# Patient Record
Sex: Female | Born: 1950
Health system: Southern US, Community
[De-identification: ages and names within clinical notes are randomized; demographics above are authoritative.]

## PROBLEM LIST (undated history)

## (undated) DIAGNOSIS — H269 Unspecified cataract: Secondary | ICD-10-CM

## (undated) DIAGNOSIS — R112 Nausea with vomiting, unspecified: Secondary | ICD-10-CM

## (undated) DIAGNOSIS — I10 Essential (primary) hypertension: Secondary | ICD-10-CM

## (undated) DIAGNOSIS — T7840XA Allergy, unspecified, initial encounter: Secondary | ICD-10-CM

## (undated) DIAGNOSIS — M810 Age-related osteoporosis without current pathological fracture: Secondary | ICD-10-CM

## (undated) DIAGNOSIS — T8859XA Other complications of anesthesia, initial encounter: Secondary | ICD-10-CM

## (undated) DIAGNOSIS — T4145XA Adverse effect of unspecified anesthetic, initial encounter: Secondary | ICD-10-CM

## (undated) DIAGNOSIS — K219 Gastro-esophageal reflux disease without esophagitis: Secondary | ICD-10-CM

## (undated) DIAGNOSIS — Z9889 Other specified postprocedural states: Secondary | ICD-10-CM

## (undated) DIAGNOSIS — E785 Hyperlipidemia, unspecified: Secondary | ICD-10-CM

## (undated) DIAGNOSIS — N3010 Interstitial cystitis (chronic) without hematuria: Secondary | ICD-10-CM

## (undated) HISTORY — PX: CHOLECYSTECTOMY: SHX55

## (undated) HISTORY — PX: BLADDER SUSPENSION: SHX72

## (undated) HISTORY — DX: Unspecified cataract: H26.9

## (undated) HISTORY — DX: Hyperlipidemia, unspecified: E78.5

## (undated) HISTORY — DX: Allergy, unspecified, initial encounter: T78.40XA

## (undated) HISTORY — DX: Essential (primary) hypertension: I10

## (undated) HISTORY — DX: Age-related osteoporosis without current pathological fracture: M81.0

## (undated) HISTORY — PX: EYE SURGERY: SHX253

---

## 1999-04-22 DIAGNOSIS — I10 Essential (primary) hypertension: Secondary | ICD-10-CM | POA: Insufficient documentation

## 2000-10-14 ENCOUNTER — Other Ambulatory Visit: Admission: RE | Admit: 2000-10-14 | Discharge: 2000-10-14 | Payer: Self-pay | Admitting: Family Medicine

## 2004-01-09 ENCOUNTER — Ambulatory Visit: Payer: Self-pay | Admitting: Family Medicine

## 2004-11-24 ENCOUNTER — Ambulatory Visit: Payer: Self-pay | Admitting: Family Medicine

## 2006-01-19 HISTORY — PX: INSERTION OF MESH: SHX5868

## 2006-02-22 DIAGNOSIS — N3010 Interstitial cystitis (chronic) without hematuria: Secondary | ICD-10-CM | POA: Insufficient documentation

## 2006-02-22 DIAGNOSIS — K461 Unspecified abdominal hernia with gangrene: Secondary | ICD-10-CM | POA: Insufficient documentation

## 2006-03-09 ENCOUNTER — Ambulatory Visit: Payer: Self-pay | Admitting: Family Medicine

## 2006-07-20 ENCOUNTER — Other Ambulatory Visit: Payer: Self-pay

## 2006-07-20 ENCOUNTER — Ambulatory Visit: Payer: Self-pay | Admitting: Urology

## 2006-07-26 ENCOUNTER — Ambulatory Visit: Payer: Self-pay | Admitting: Urology

## 2007-07-28 ENCOUNTER — Ambulatory Visit: Payer: Self-pay | Admitting: Family Medicine

## 2007-08-25 LAB — HM COLONOSCOPY: HM COLON: NORMAL

## 2008-10-16 ENCOUNTER — Ambulatory Visit: Payer: Self-pay | Admitting: Family Medicine

## 2009-11-14 ENCOUNTER — Ambulatory Visit: Payer: Self-pay | Admitting: Family Medicine

## 2010-12-04 ENCOUNTER — Ambulatory Visit: Payer: Self-pay | Admitting: Family Medicine

## 2011-10-12 LAB — HM PAP SMEAR: HM Pap smear: NEGATIVE

## 2011-12-08 ENCOUNTER — Ambulatory Visit: Payer: Self-pay | Admitting: Family Medicine

## 2012-04-28 ENCOUNTER — Ambulatory Visit: Payer: Self-pay | Admitting: Family Medicine

## 2012-12-08 ENCOUNTER — Ambulatory Visit: Payer: Self-pay | Admitting: Family Medicine

## 2012-12-13 ENCOUNTER — Ambulatory Visit: Payer: Self-pay | Admitting: Family Medicine

## 2013-04-21 LAB — LIPID PANEL
Cholesterol: 168 mg/dL (ref 0–200)
HDL: 73 mg/dL — AB (ref 35–70)
LDL CALC: 86 mg/dL
TRIGLYCERIDES: 45 mg/dL (ref 40–160)

## 2013-04-21 LAB — BASIC METABOLIC PANEL
BUN: 11 mg/dL (ref 4–21)
Creatinine: 0.7 mg/dL (ref 0.5–1.1)
GLUCOSE: 91 mg/dL
POTASSIUM: 4.3 mmol/L (ref 3.4–5.3)
SODIUM: 139 mmol/L (ref 137–147)

## 2013-04-21 LAB — CBC AND DIFFERENTIAL
HCT: 40 % (ref 36–46)
Hemoglobin: 13.7 g/dL (ref 12.0–16.0)
Platelets: 274 10*3/uL (ref 150–399)
WBC: 4.6 10*3/mL

## 2013-04-21 LAB — HEPATIC FUNCTION PANEL
ALT: 14 U/L (ref 7–35)
AST: 20 U/L (ref 13–35)

## 2013-04-21 LAB — TSH: TSH: 2.14 u[IU]/mL (ref 0.41–5.90)

## 2013-12-21 ENCOUNTER — Ambulatory Visit: Payer: Self-pay | Admitting: Family Medicine

## 2013-12-21 LAB — HM MAMMOGRAPHY

## 2014-07-17 ENCOUNTER — Other Ambulatory Visit: Payer: Self-pay

## 2014-07-17 DIAGNOSIS — J309 Allergic rhinitis, unspecified: Secondary | ICD-10-CM | POA: Insufficient documentation

## 2014-07-17 MED ORDER — FLUTICASONE PROPIONATE 50 MCG/ACT NA SUSP: 2.0000 | Freq: Every day | NASAL | Status: DC

## 2014-11-23 DIAGNOSIS — K219 Gastro-esophageal reflux disease without esophagitis: Secondary | ICD-10-CM | POA: Insufficient documentation

## 2014-11-26 ENCOUNTER — Ambulatory Visit (INDEPENDENT_AMBULATORY_CARE_PROVIDER_SITE_OTHER): Payer: BLUE CROSS/BLUE SHIELD | Admitting: Family Medicine

## 2014-11-26 ENCOUNTER — Encounter: Payer: Self-pay | Admitting: Family Medicine

## 2014-11-26 VITALS — BP 122/80 | HR 72 | Temp 97.7°F | Resp 16 | Ht 66.0 in | Wt 135.0 lb

## 2014-11-26 DIAGNOSIS — I10 Essential (primary) hypertension: Secondary | ICD-10-CM

## 2014-11-26 DIAGNOSIS — N3010 Interstitial cystitis (chronic) without hematuria: Secondary | ICD-10-CM | POA: Diagnosis not present

## 2014-11-26 DIAGNOSIS — Z1231 Encounter for screening mammogram for malignant neoplasm of breast: Secondary | ICD-10-CM

## 2014-11-26 DIAGNOSIS — Z0000 Encounter for general adult medical examination without abnormal findings: Secondary | ICD-10-CM | POA: Diagnosis not present

## 2014-11-26 DIAGNOSIS — J309 Allergic rhinitis, unspecified: Secondary | ICD-10-CM

## 2014-11-26 DIAGNOSIS — Z1211 Encounter for screening for malignant neoplasm of colon: Secondary | ICD-10-CM

## 2014-11-26 DIAGNOSIS — K219 Gastro-esophageal reflux disease without esophagitis: Secondary | ICD-10-CM | POA: Diagnosis not present

## 2014-11-26 LAB — IFOBT (OCCULT BLOOD): IMMUNOLOGICAL FECAL OCCULT BLOOD TEST: NEGATIVE

## 2014-11-26 NOTE — Progress Notes (Signed)
Patient ID: [name], female   DOB: November 07, 1950, 64 y.o.   MRN: [id number]         Patient: [name], Female    DOB: [birth year], 64 y.o.   MRN: [id number] Visit Date: 11/26/2014  Today's Provider: Lorie Phenix, MD   Chief Complaint  Patient presents with  . Annual Exam   Subjective:    Annual physical exam [name] is a 64 y.o. female who presents today for health maintenance and complete physical. She feels well.  Recently had a flare of interstitial cystitis;  She is being followed by urology and reports feeling much better.    She reports exercising regularly. She reports she is sleeping well.   She is having no reflux.  Would like to taper off Pantoprazole.  Is very well controlled currently.   Has not tried to taper.  Would like to taper.  Also, hypertension is well controlled. No side effects to medication. No other concerns today.     Review of Systems  Constitutional: Negative.   HENT: Negative.   Eyes: Negative.   Respiratory: Negative.   Cardiovascular: Negative.   Gastrointestinal: Negative.   Endocrine: Negative.   Genitourinary: Positive for frequency. Negative for dysuria, urgency, hematuria, flank pain, decreased urine volume, vaginal bleeding, vaginal discharge, enuresis, difficulty urinating, genital sores, vaginal pain, menstrual problem, pelvic pain and dyspareunia.  Musculoskeletal: Negative.   Skin: Negative.   Allergic/Immunologic: Negative.   Neurological: Negative.   Hematological: Negative.   Psychiatric/Behavioral: Negative.     Social History She  reports that she quit smoking about 18 years ago. She has never used smokeless tobacco. She reports that she does not drink alcohol or use illicit drugs. Social History   Social History  . Marital Status: Married    Spouse Name: N/A  . Number of Children: 2  . Years of Education: H/S   Occupational History  . Full-time    Social History Main Topics  . Smoking status: Former Smoker      Quit date: 01/20/1996  . Smokeless tobacco: Never Used  . Alcohol Use: No  . Drug Use: No  . Sexual Activity: Not Asked   Other Topics Concern  . None   Social History Narrative    Patient Active Problem List   Diagnosis Date Noted  . Acid reflux 11/23/2014  . Allergic rhinitis 07/17/2014  . Current tobacco use 02/26/2006  . Chronic interstitial cystitis 02/22/2006  . Abdominal hernia with gangrene and obstruction 02/22/2006  . BP (high blood pressure) 04/22/1999    Past Surgical History  Procedure Laterality Date  . Cholecystectomy    . Insertion of mesh  2008    Trans vaginal mesh    Family History  Family Status  Relation Status Death Age  . Father Alive   . Mother Deceased 32  . Sister Alive    Her family history includes Bladder Cancer in her father; Cancer in her father; Congestive Heart Failure in her mother; Coronary artery disease in her father; Healthy in her sister; Hypertension in her father and sister.    Allergies  Allergen Reactions  . Penicillins Rash  . Sulfa Antibiotics Rash    Previous Medications   AMITRIPTYLINE (ELAVIL) 50 MG TABLET    Take 1 tablet by mouth daily.   CALCIUM CARBONATE-VITAMIN D 600-200 MG-UNIT CAPS    Take 2 tablets by mouth daily.   DIAZEPAM (VALIUM) 10 MG TABLET       FLUTICASONE (FLONASE) 50  MCG/ACT NASAL SPRAY    Place 2 sprays into both nostrils daily.   MAGNESIUM OXIDE 250 MG TABS    Take 1 tablet by mouth daily.   MYRBETRIQ 50 MG TB24 TABLET       PANTOPRAZOLE (PROTONIX) 40 MG TABLET    Take 1 tablet by mouth daily.   TRIAMTERENE-HYDROCHLOROTHIAZIDE (MAXZIDE-25) 37.5-25 MG TABLET    Take 1 tablet by mouth daily.    Patient Care Team: Lorie PhenixNancy Verlyn Dannenberg, MD as PCP - General (Family Medicine)     Objective:   Vitals: BP 122/80 mmHg  Pulse 72  Temp(Src) 97.7 F (36.5 C) (Oral)  Resp 16  Ht 5\' 6"  (1.676 m)  Wt 135 lb (61.236 kg)  BMI 21.80 kg/m2   Physical Exam  Constitutional: She appears well-developed  and well-nourished.  HENT:  Head: Normocephalic and atraumatic.  Right Ear: Tympanic membrane, external ear and ear canal normal.  Left Ear: Tympanic membrane, external ear and ear canal normal.  Nose: Nose normal.  Mouth/Throat: Uvula is midline, oropharynx is clear and moist and mucous membranes are normal.  Eyes: Conjunctivae, EOM and lids are normal. Pupils are equal, round, and reactive to light. Lids are everted and swept, no foreign bodies found.  Neck: Trachea normal and normal range of motion. Carotid bruit is not present.  Cardiovascular: Normal rate, regular rhythm, normal heart sounds and normal pulses.   Pulmonary/Chest: Effort normal and breath sounds normal.  Genitourinary: No breast swelling, tenderness, discharge or bleeding.  Musculoskeletal: Normal range of motion.  Neurological: She is alert.     Depression Screen No flowsheet data found.    Assessment & Plan:     Routine Health Maintenance and Physical Exam  Exercise Activities and Dietary recommendations Goals    None      Immunization History  Administered Date(s) Administered  . Tdap 10/10/2010    Health Maintenance  Topic Date Due  . Hepatitis C Screening  03-29-1950  . HIV Screening  12/26/1966  . ZOSTAVAX  12/26/2011  . INFLUENZA VACCINE  08/20/2014  . PAP SMEAR  10/12/2014  . MAMMOGRAM  12/22/2015  . COLONOSCOPY  08/24/2017  . TETANUS/TDAP  10/09/2020      Discussed health benefits of physical activity, and encouraged her to engage in regular exercise appropriate for her age and condition.   1. Annual physical exam Continue to eat healthy and exercise.   - POCT urinalysis dipstick Results for orders placed or performed in visit on 11/26/14  IFOBT POC (occult bld, rslt in office)  Result Value Ref Range   IFOBT Negative   POCT urinalysis dipstick  Result Value Ref Range   Color, UA Yellow    Clarity, UA Clear    Glucose, UA Negative    Bilirubin, UA Negative    Ketones, UA  Negative    Spec Grav, UA 1.010    Blood, UA Negative    pH, UA 7.5    Protein, UA Negative    Urobilinogen, UA 0.2    Nitrite, UA Negative    Leukocytes, UA Negative Negative     2. Essential hypertension Stable. Continue current medication and plan of care.    - Lipid panel - TSH  3. Gastroesophageal reflux disease without esophagitis Will change to Prilosec 20 mg daily for one month and then change to Zantac and then decrease as tolerated.  - Comprehensive metabolic panel  4. Allergic rhinitis, unspecified allergic rhinitis type Stable. Check labs.  - CBC with Differential/Platelet  5.  Chronic interstitial cystitis Much better. Thinking of tapering medication.    6. Encounter for screening mammogram for breast cancer Will call and schedule.   - MM Digital Screening  7. Colon cancer screening Negative today.   - IFOBT POC (occult bld, rslt in office)  Patient was seen and examined by Leo Grosser, MD, and note scribed by Kavin Leech, CMA.    I have reviewed the document for accuracy and completeness and I agree with above. Leo Grosser, MD   Lorie Phenix, MD      --------------------------------------------------------------------

## 2014-11-27 LAB — POCT URINALYSIS DIPSTICK
BILIRUBIN UA: NEGATIVE
Blood, UA: NEGATIVE
GLUCOSE UA: NEGATIVE
KETONES UA: NEGATIVE
LEUKOCYTES UA: NEGATIVE
NITRITE UA: NEGATIVE
PH UA: 7.5
Protein, UA: NEGATIVE
Spec Grav, UA: 1.01
Urobilinogen, UA: 0.2

## 2014-11-27 MED ORDER — TRIAMTERENE-HCTZ 37.5-25 MG PO TABS: 1.0000 | ORAL_TABLET | Freq: Every day | ORAL | Status: DC

## 2014-11-27 MED ORDER — AMITRIPTYLINE HCL 50 MG PO TABS: 50.0000 mg | ORAL_TABLET | Freq: Every day | ORAL | Status: DC

## 2014-11-27 MED ORDER — OMEPRAZOLE 20 MG PO CPDR: 20.0000 mg | DELAYED_RELEASE_CAPSULE | Freq: Every day | ORAL | Status: DC

## 2014-12-04 ENCOUNTER — Telehealth: Payer: Self-pay

## 2014-12-04 DIAGNOSIS — K219 Gastro-esophageal reflux disease without esophagitis: Secondary | ICD-10-CM

## 2014-12-04 LAB — CBC WITH DIFFERENTIAL/PLATELET
BASOS ABS: 0 10*3/uL (ref 0.0–0.2)
Basos: 0 %
EOS (ABSOLUTE): 0.1 10*3/uL (ref 0.0–0.4)
Eos: 2 %
Hematocrit: 40.6 % (ref 34.0–46.6)
Hemoglobin: 14.6 g/dL (ref 11.1–15.9)
Immature Grans (Abs): 0 10*3/uL (ref 0.0–0.1)
Immature Granulocytes: 0 %
LYMPHS ABS: 1.4 10*3/uL (ref 0.7–3.1)
Lymphs: 31 %
MCH: 31.9 pg (ref 26.6–33.0)
MCHC: 36 g/dL — AB (ref 31.5–35.7)
MCV: 89 fL (ref 79–97)
MONOCYTES: 8 %
MONOS ABS: 0.4 10*3/uL (ref 0.1–0.9)
Neutrophils Absolute: 2.7 10*3/uL (ref 1.4–7.0)
Neutrophils: 59 %
PLATELETS: 279 10*3/uL (ref 150–379)
RBC: 4.57 x10E6/uL (ref 3.77–5.28)
RDW: 12.6 % (ref 12.3–15.4)
WBC: 4.6 10*3/uL (ref 3.4–10.8)

## 2014-12-04 LAB — COMPREHENSIVE METABOLIC PANEL
A/G RATIO: 1.8 (ref 1.1–2.5)
ALBUMIN: 4.6 g/dL (ref 3.6–4.8)
ALK PHOS: 83 IU/L (ref 39–117)
ALT: 14 IU/L (ref 0–32)
AST: 22 IU/L (ref 0–40)
BILIRUBIN TOTAL: 0.3 mg/dL (ref 0.0–1.2)
BUN / CREAT RATIO: 16 (ref 11–26)
BUN: 12 mg/dL (ref 8–27)
CALCIUM: 9.3 mg/dL (ref 8.7–10.3)
CHLORIDE: 95 mmol/L — AB (ref 97–106)
CO2: 24 mmol/L (ref 18–29)
Creatinine, Ser: 0.74 mg/dL (ref 0.57–1.00)
GFR calc non Af Amer: 87 mL/min/{1.73_m2} (ref >59)
GFR, EST AFRICAN AMERICAN: 100 mL/min/{1.73_m2} (ref >59)
GLOBULIN, TOTAL: 2.5 g/dL (ref 1.5–4.5)
Glucose: 97 mg/dL (ref 65–99)
Potassium: 4.1 mmol/L (ref 3.5–5.2)
Sodium: 136 mmol/L (ref 136–144)
TOTAL PROTEIN: 7.1 g/dL (ref 6.0–8.5)

## 2014-12-04 LAB — TSH: TSH: 1.77 u[IU]/mL (ref 0.450–4.500)

## 2014-12-04 LAB — LIPID PANEL
CHOLESTEROL TOTAL: 193 mg/dL (ref 100–199)
Chol/HDL Ratio: 2.4 ratio units (ref 0.0–4.4)
HDL: 80 mg/dL (ref >39)
LDL Calculated: 101 mg/dL — ABNORMAL HIGH (ref 0–99)
TRIGLYCERIDES: 58 mg/dL (ref 0–149)
VLDL Cholesterol Cal: 12 mg/dL (ref 5–40)

## 2014-12-04 MED ORDER — PANTOPRAZOLE SODIUM 40 MG PO TBEC: 40.0000 mg | DELAYED_RELEASE_TABLET | Freq: Every day | ORAL | Status: DC

## 2014-12-04 MED ORDER — OMEPRAZOLE 20 MG PO CPDR: 20.0000 mg | DELAYED_RELEASE_CAPSULE | Freq: Every day | ORAL | Status: DC

## 2014-12-04 NOTE — Telephone Encounter (Signed)
Patient advised as below.  Patient reports that she is not tolerating Omeprazole, reports abdominal pain. Patient is requesting refill on Pantoprozole.

## 2014-12-04 NOTE — Telephone Encounter (Signed)
[facility]raz[facility].   Thanks,   -Vernona RiegerLaura

## 2014-12-04 NOTE — Telephone Encounter (Signed)
LMTCB 12/04/2014  Thanks,   -Ruperto Kiernan  

## 2014-12-04 NOTE — Telephone Encounter (Signed)
Sent in both Omeprazole and Pantoprazole accidentally. Please call pharmacy and cancel Omeprazole. Thanks.

## 2014-12-04 NOTE — Telephone Encounter (Signed)
-----   Message from Lorie PhenixNancy Maloney, MD sent at 12/04/2014  7:25 AM EST ----- Labs stable. Please notify patient. Thanks.

## 2014-12-25 ENCOUNTER — Ambulatory Visit
Admission: RE | Admit: 2014-12-25 | Discharge: 2014-12-25 | Disposition: A | Discharge status: Home / Self Care | Payer: BLUE CROSS/BLUE SHIELD | Source: Ambulatory Visit | Attending: Family Medicine | Admitting: Family Medicine

## 2014-12-25 DIAGNOSIS — Z1231 Encounter for screening mammogram for malignant neoplasm of breast: Secondary | ICD-10-CM | POA: Insufficient documentation

## 2015-02-02 ENCOUNTER — Encounter: Payer: Self-pay | Admitting: Family Medicine

## 2015-02-02 ENCOUNTER — Ambulatory Visit (INDEPENDENT_AMBULATORY_CARE_PROVIDER_SITE_OTHER): Payer: BLUE CROSS/BLUE SHIELD | Admitting: Family Medicine

## 2015-02-02 VITALS — BP 100/60 | HR 96 | Temp 98.2°F | Resp 16 | Wt 135.8 lb

## 2015-02-02 DIAGNOSIS — J0120 Acute ethmoidal sinusitis, unspecified: Secondary | ICD-10-CM | POA: Diagnosis not present

## 2015-02-02 MED ORDER — DOXYCYCLINE HYCLATE 100 MG PO TABS: 100.0000 mg | ORAL_TABLET | Freq: Two times a day (BID) | ORAL | Status: DC

## 2015-02-02 NOTE — Progress Notes (Signed)
Subjective:     Patient ID: [name], female   DOB: 04/02/1950, 65 y.o.   MRN: [id number]  HPI  Chief Complaint  Patient presents with  . Sinus Congestion    patient is here today concer about sinus congestion, a little cougn,pressure, sneezing runny nose,and postnasal drip for the past 10 days. No Fever,SOB,wheezing or chest tightness. Treatments tried: Mucinex, Cold and sinus, Zyrtec D, Ibuprofen and Tylenol.  Reports locked in sinus pressure with occasional clear to bloody drainage. Hx of remote sinus surgery.   Review of Systems  Constitutional: Negative for fever and chills.       Objective:   Physical Exam  Constitutional: She appears well-developed and well-nourished. No distress.  Ears: T.M's intact without inflammation Sinuses: mild paranasal sinus tenderness Throat: no tonsillar enlargement or exudate Neck: no cervical adenopathy Lungs: clear     Assessment:    1. Acute ethmoidal sinusitis, recurrence not specified - doxycycline (VIBRA-TABS) 100 MG tablet; Take 1 tablet (100 mg total) by mouth 2 (two) times daily.  Dispense: 20 tablet; Refill: 0    Plan:    Continue Mucinex D and saline nasal spray.

## 2015-02-02 NOTE — Patient Instructions (Signed)
Continue Mucinex D and saline nasal spray.

## 2015-04-20 ENCOUNTER — Other Ambulatory Visit: Payer: Self-pay | Admitting: Family Medicine

## 2015-04-20 DIAGNOSIS — J309 Allergic rhinitis, unspecified: Secondary | ICD-10-CM

## 2015-06-18 ENCOUNTER — Other Ambulatory Visit: Payer: Self-pay | Admitting: Family Medicine

## 2015-06-18 DIAGNOSIS — I10 Essential (primary) hypertension: Secondary | ICD-10-CM

## 2015-11-21 ENCOUNTER — Ambulatory Visit (INDEPENDENT_AMBULATORY_CARE_PROVIDER_SITE_OTHER): Payer: BLUE CROSS/BLUE SHIELD | Admitting: Vascular Surgery

## 2015-11-21 ENCOUNTER — Encounter (INDEPENDENT_AMBULATORY_CARE_PROVIDER_SITE_OTHER): Payer: Self-pay | Admitting: Vascular Surgery

## 2015-11-21 VITALS — BP 151/93 | HR 89 | Resp 16 | Ht 66.0 in | Wt 129.0 lb

## 2015-11-21 DIAGNOSIS — I10 Essential (primary) hypertension: Secondary | ICD-10-CM | POA: Diagnosis not present

## 2015-11-21 DIAGNOSIS — I8312 Varicose veins of left lower extremity with inflammation: Secondary | ICD-10-CM

## 2015-11-21 DIAGNOSIS — M79609 Pain in unspecified limb: Secondary | ICD-10-CM | POA: Insufficient documentation

## 2015-11-21 DIAGNOSIS — M79604 Pain in right leg: Secondary | ICD-10-CM

## 2015-11-21 DIAGNOSIS — I8311 Varicose veins of right lower extremity with inflammation: Secondary | ICD-10-CM

## 2015-11-21 DIAGNOSIS — I872 Venous insufficiency (chronic) (peripheral): Secondary | ICD-10-CM

## 2015-11-21 HISTORY — PX: LASER ABLATION: SHX1947

## 2015-11-21 NOTE — Progress Notes (Signed)
The patient's right lower extremity was sterilely prepped and draped.  The ultrasound machine was used to visualize the right great saphenous vein throughout its course.  A segment below the knee was selected for access.  The saphenous vein was accessed without difficulty using ultrasound guidance with a micropuncture needle.   An 0.018  wire was placed beyond the saphenofemoral junction through the sheath and the microneedle was removed.  The 65 cm sheath was then placed over the wire and the wire and dilator were removed.  The laser fiber was placed through the sheath and its tip was placed approximately 2 cm below the saphenofemoral junction.  Tumescent anesthesia was then created with a dilute lidocaine solution.  Laser energy was then delivered with constant withdrawal of the sheath and laser fiber.  Approximately 1763 Joules of energy were delivered over a length of 50 cm.  Sterile dressings were placed.  The patient tolerated the procedure well without complications.

## 2015-11-22 ENCOUNTER — Other Ambulatory Visit (INDEPENDENT_AMBULATORY_CARE_PROVIDER_SITE_OTHER): Payer: Self-pay | Admitting: Vascular Surgery

## 2015-11-25 ENCOUNTER — Other Ambulatory Visit (INDEPENDENT_AMBULATORY_CARE_PROVIDER_SITE_OTHER): Payer: Self-pay | Admitting: Vascular Surgery

## 2015-11-25 DIAGNOSIS — I83811 Varicose veins of right lower extremities with pain: Secondary | ICD-10-CM

## 2015-11-26 ENCOUNTER — Ambulatory Visit (INDEPENDENT_AMBULATORY_CARE_PROVIDER_SITE_OTHER): Payer: BLUE CROSS/BLUE SHIELD

## 2015-11-26 DIAGNOSIS — I83811 Varicose veins of right lower extremities with pain: Secondary | ICD-10-CM | POA: Diagnosis not present

## 2015-11-27 ENCOUNTER — Ambulatory Visit (INDEPENDENT_AMBULATORY_CARE_PROVIDER_SITE_OTHER): Payer: BLUE CROSS/BLUE SHIELD | Admitting: Physician Assistant

## 2015-11-27 ENCOUNTER — Encounter: Payer: BLUE CROSS/BLUE SHIELD | Admitting: Family Medicine

## 2015-11-27 ENCOUNTER — Encounter: Payer: Self-pay | Admitting: Physician Assistant

## 2015-11-27 VITALS — BP 120/72 | HR 76 | Temp 98.6°F | Resp 16 | Ht 66.0 in | Wt 130.0 lb

## 2015-11-27 DIAGNOSIS — Z1231 Encounter for screening mammogram for malignant neoplasm of breast: Secondary | ICD-10-CM | POA: Diagnosis not present

## 2015-11-27 DIAGNOSIS — J3089 Other allergic rhinitis: Secondary | ICD-10-CM

## 2015-11-27 DIAGNOSIS — K219 Gastro-esophageal reflux disease without esophagitis: Secondary | ICD-10-CM | POA: Diagnosis not present

## 2015-11-27 DIAGNOSIS — Z13220 Encounter for screening for lipoid disorders: Secondary | ICD-10-CM | POA: Diagnosis not present

## 2015-11-27 DIAGNOSIS — Z87891 Personal history of nicotine dependence: Secondary | ICD-10-CM

## 2015-11-27 DIAGNOSIS — I10 Essential (primary) hypertension: Secondary | ICD-10-CM | POA: Diagnosis not present

## 2015-11-27 DIAGNOSIS — Z136 Encounter for screening for cardiovascular disorders: Secondary | ICD-10-CM

## 2015-11-27 DIAGNOSIS — Z1159 Encounter for screening for other viral diseases: Secondary | ICD-10-CM | POA: Diagnosis not present

## 2015-11-27 DIAGNOSIS — Z0000 Encounter for general adult medical examination without abnormal findings: Secondary | ICD-10-CM | POA: Diagnosis not present

## 2015-11-27 MED ORDER — PANTOPRAZOLE SODIUM 40 MG PO TBEC: 40.0000 mg | DELAYED_RELEASE_TABLET | Freq: Every day | ORAL | 3 refills | Status: DC

## 2015-11-27 MED ORDER — AMITRIPTYLINE HCL 50 MG PO TABS: ORAL_TABLET | ORAL | 3 refills | Status: DC

## 2015-11-27 MED ORDER — FLUTICASONE PROPIONATE 50 MCG/ACT NA SUSP: NASAL | 5 refills | Status: DC

## 2015-11-27 MED ORDER — TRIAMTERENE-HCTZ 37.5-25 MG PO TABS: 1.0000 | ORAL_TABLET | Freq: Every day | ORAL | 3 refills | Status: DC

## 2015-11-27 NOTE — Patient Instructions (Signed)

## 2015-11-27 NOTE — Progress Notes (Signed)
Patient: [name], Female    DOB: 03/18/1950, 65 y.o.   MRN: [id number] Visit Date: 11/27/2015  Today's Provider: Margaretann LovelessJennifer M Burnette, PA-C   Chief Complaint  Patient presents with  . Annual Exam   Subjective:    Annual physical exam [name] is a 65 y.o. female who presents today for health maintenance and complete physical. She feels well. She reports exercising daily. She reports she is sleeping well.  11/26/14 CPE 10/12/11 Pap-neg HPV-neg; Repeat in 5 years 12/25/14 Mammogram-BI-RADS 1 08/25/07 Colonoscopy-normal recheck in 5 yrs; Patient refuses -----------------------------------------------------------------   Review of Systems  Constitutional: Negative.   HENT: Negative.   Eyes: Negative.   Respiratory: Negative.   Cardiovascular: Negative.   Gastrointestinal: Negative.   Endocrine: Negative.   Genitourinary: Negative.   Musculoskeletal: Negative.   Skin: Negative.   Allergic/Immunologic: Negative.   Neurological: Negative.   Hematological: Negative.   Psychiatric/Behavioral: Negative.     Social History      She  reports that she quit smoking about 19 years ago. She has never used smokeless tobacco. She reports that she does not drink alcohol or use drugs.       Social History   Social History  . Marital status: Married    Spouse name: N/A  . Number of children: 2  . Years of education: H/S   Occupational History  . Full-time    Social History Main Topics  . Smoking status: Former Smoker    Quit date: 01/20/1996  . Smokeless tobacco: Never Used  . Alcohol use No  . Drug use: No  . Sexual activity: Not Asked   Other Topics Concern  . None   Social History Narrative  . None    History reviewed. No pertinent past medical history.   Patient Active Problem List   Diagnosis Date Noted  . Varicose veins of both lower extremities with inflammation 11/21/2015  . Chronic venous insufficiency 11/21/2015  . Pain in limb 11/21/2015   . Acid reflux 11/23/2014  . Allergic rhinitis 07/17/2014  . History of tobacco use 02/26/2006  . Chronic interstitial cystitis 02/22/2006  . Abdominal hernia with gangrene and obstruction 02/22/2006  . BP (high blood pressure) 04/22/1999    Past Surgical History:  Procedure Laterality Date  . CHOLECYSTECTOMY    . INSERTION OF MESH  2008   Trans vaginal mesh  . LASER ABLATION Right 11/21/2015    [facility] Dr. Gilda CreaseSchnier    Family History        Family Status  Relation Status  . Father Deceased at age 47102   liver  . Mother Deceased at age 64  . Sister Alive  . Paternal Aunt   . Son Alive  . Son Alive        Her family history includes Bladder Cancer in her father; Breast cancer in her paternal aunt; Cancer in her father; Congestive Heart Failure in her mother; Coronary artery disease in her father; Healthy in her sister, son, and son; Hypertension in her father and sister; Liver disease in her father.     Allergies  Allergen Reactions  . Doxycycline Nausea Only  . Penicillins Rash  . Sulfa Antibiotics Rash     Current Outpatient Prescriptions:  .  amitriptyline (ELAVIL) 50 MG tablet, Take 1 tablet (50 mg total) by mouth daily., Disp: 90 tablet, Rfl: 3 .  Calcium Carbonate-Vitamin D 600-200 MG-UNIT CAPS, Take 2 tablets by mouth daily., Disp: ,  Rfl:  .  Calcium Glycerophosphate (PRELIEF PO), Take 2 tablets by mouth daily. As needed , Disp: , Rfl:  .  diazepam (VALIUM) 10 MG tablet, Take 5 mg by mouth as needed. , Disp: , Rfl:  .  fluticasone (FLONASE) 50 MCG/ACT nasal spray, Place 2 sprays into both nostrils daily., Disp: 16 g, Rfl: 5 .  Meth-Hyo-M Bl-Na Phos-Ph Sal (URIBEL) 118 MG CAPS, Reported on 02/02/2015 AS NEEDED, Disp: , Rfl:  .  MYRBETRIQ 50 MG TB24 tablet, Take 50 mg by mouth daily. , Disp: , Rfl:  .  pantoprazole (PROTONIX) 40 MG tablet, Take 1 tablet (40 mg total) by mouth daily., Disp: 90 tablet, Rfl: 3 .  triamterene-hydrochlorothiazide  (MAXZIDE-25) 37.5-25 MG tablet, Take 1 tablet by mouth daily., Disp: 90 tablet, Rfl: 3   Patient Care Team: Margaretann Loveless, PA-C as PCP - General (Family Medicine)      Objective:   Vitals: BP 120/72 (BP Location: Right Arm, Patient Position: Sitting, Cuff Size: Normal)   Pulse 76   Temp 98.6 F (37 C) (Oral)   Resp 16   Ht 5\' 6"  (1.676 m)   Wt 130 lb (59 kg)   SpO2 99%   BMI 20.98 kg/m    Physical Exam  Constitutional: She is oriented to person, place, and time. She appears well-developed and well-nourished. No distress.  HENT:  Head: Normocephalic and atraumatic.  Right Ear: Tympanic membrane, external ear and ear canal normal.  Left Ear: Tympanic membrane, external ear and ear canal normal.  Nose: Nose normal.  Mouth/Throat: Uvula is midline, oropharynx is clear and moist and mucous membranes are normal. No oropharyngeal exudate.  Eyes: Conjunctivae and EOM are normal. Pupils are equal, round, and reactive to light. Right eye exhibits no discharge. Left eye exhibits no discharge. No scleral icterus.  Neck: Normal range of motion. Neck supple. No JVD present. Carotid bruit is not present. No tracheal deviation present. No thyromegaly present.  Cardiovascular: Normal rate, regular rhythm, normal heart sounds and intact distal pulses.  Exam reveals no gallop and no friction rub.   No murmur heard. Pulmonary/Chest: Effort normal and breath sounds normal. No respiratory distress. She has no wheezes. She has no rales. She exhibits no tenderness. Right breast exhibits no inverted nipple, no mass, no nipple discharge, no skin change and no tenderness. Left breast exhibits no inverted nipple, no mass, no nipple discharge, no skin change and no tenderness. Breasts are symmetrical.  Abdominal: Soft. Bowel sounds are normal. She exhibits no distension and no mass. There is no tenderness. There is no rebound and no guarding.  Musculoskeletal: Normal range of motion. She exhibits no edema  or tenderness.  Lymphadenopathy:    She has no cervical adenopathy.  Neurological: She is alert and oriented to person, place, and time.  Skin: Skin is warm and dry. No rash noted. She is not diaphoretic.  Psychiatric: She has a normal mood and affect. Her behavior is normal. Judgment and thought content normal.  Vitals reviewed.  Depression Screen PHQ 2/9 Scores 11/27/2015  PHQ - 2 Score 0    Assessment & Plan:     Routine Health Maintenance and Physical Exam  Exercise Activities and Dietary recommendations Goals    None      Immunization History  Administered Date(s) Administered  . Influenza,inj,Quad PF,36+ Mos 10/20/2014  . Influenza-Unspecified 10/15/2015  . Tdap 10/10/2010  . Zoster 02/16/2015    Health Maintenance  Topic Date Due  . Hepatitis C Screening  04-30-1950  . PAP SMEAR  10/11/2016  . MAMMOGRAM  12/24/2016  . COLONOSCOPY  08/24/2017  . TETANUS/TDAP  10/09/2020  . INFLUENZA VACCINE  Completed  . ZOSTAVAX  Completed  . HIV Screening  Excluded     Discussed health benefits of physical activity, and encouraged her to engage in regular exercise appropriate for her age and condition.   1. Annual physical exam Normal physical exam today. Will check labs as below and f/u pending lab results. If labs are stable and WNL she will not need to have these rechecked for one year at her next annual physical exam. She is to call the office in the meantime if she has any acute issue, questions or concerns. - CBC with Differential/Platelet - Comprehensive metabolic panel - TSH  2. Encounter for screening mammogram for breast cancer Breast exam today was normal. There is no family history of breast cancer. She does perform regular self breast exams. Mammogram was ordered as below. [facility] given [facility] patient so she may schedule her mammogram at her convenience. - MM DIGITAL SCREENING BILATERAL; Future  3. Essential  hypertension Stable. Diagnosis pulled for medication refill. Continue current medical treatment plan. Will check labs as below and f/u pending results. - CBC with Differential/Platelet - Comprehensive metabolic panel - triamterene-hydrochlorothiazide (MAXZIDE-25) 37.5-25 MG tablet; Take 1 tablet by mouth daily.  Dispense: 90 tablet; Refill: 3  4. Allergic rhinitis due to other allergic trigger, unspecified chronicity, unspecified seasonality Stable. Diagnosis pulled for medication refill. Continue current medical treatment plan. - fluticasone (FLONASE) 50 MCG/ACT nasal spray; Place 2 sprays into both nostrils daily.  Dispense: 16 g; Refill: 5  5. Gastroesophageal reflux disease without esophagitis Stable. Diagnosis pulled for medication refill. Continue current medical treatment plan. - pantoprazole (PROTONIX) 40 MG tablet; Take 1 tablet (40 mg total) by mouth daily.  Dispense: 90 tablet; Refill: 3  6. History of tobacco use  7. Encounter for lipid screening for cardiovascular disease Will check labs as below and f/u pending results. - Lipid Panel With LDL/HDL Ratio  8. Need for hepatitis C screening test - Hepatitis C Antibody   --------------------------------------------------------------------    Margaretann LovelessJennifer M Burnette, PA-C  [facility]

## 2015-11-29 ENCOUNTER — Other Ambulatory Visit: Payer: Self-pay | Admitting: Physician Assistant

## 2015-11-30 LAB — COMPREHENSIVE METABOLIC PANEL
ALBUMIN: 4.4 g/dL (ref 3.6–4.8)
ALT: 14 IU/L (ref 0–32)
AST: 18 IU/L (ref 0–40)
Albumin/Globulin Ratio: 1.8 (ref 1.2–2.2)
Alkaline Phosphatase: 80 IU/L (ref 39–117)
BUN / CREAT RATIO: 20 (ref 12–28)
BUN: 15 mg/dL (ref 8–27)
Bilirubin Total: 0.3 mg/dL (ref 0.0–1.2)
CO2: 27 mmol/L (ref 18–29)
CREATININE: 0.75 mg/dL (ref 0.57–1.00)
Calcium: 9.6 mg/dL (ref 8.7–10.3)
Chloride: 99 mmol/L (ref 96–106)
GFR calc non Af Amer: 85 mL/min/{1.73_m2} (ref >59)
GFR, EST AFRICAN AMERICAN: 98 mL/min/{1.73_m2} (ref >59)
GLUCOSE: 103 mg/dL — AB (ref 65–99)
Globulin, Total: 2.5 g/dL (ref 1.5–4.5)
Potassium: 4.5 mmol/L (ref 3.5–5.2)
Sodium: 141 mmol/L (ref 134–144)
TOTAL PROTEIN: 6.9 g/dL (ref 6.0–8.5)

## 2015-11-30 LAB — HEPATITIS C ANTIBODY

## 2015-11-30 LAB — CBC WITH DIFFERENTIAL/PLATELET
BASOS ABS: 0 10*3/uL (ref 0.0–0.2)
Basos: 1 %
EOS (ABSOLUTE): 0.2 10*3/uL (ref 0.0–0.4)
Eos: 4 %
HEMOGLOBIN: 14.3 g/dL (ref 11.1–15.9)
Hematocrit: 39.6 % (ref 34.0–46.6)
IMMATURE GRANS (ABS): 0 10*3/uL (ref 0.0–0.1)
IMMATURE GRANULOCYTES: 0 %
LYMPHS: 31 %
Lymphocytes Absolute: 1.3 10*3/uL (ref 0.7–3.1)
MCH: 32.1 pg (ref 26.6–33.0)
MCHC: 36.1 g/dL — ABNORMAL HIGH (ref 31.5–35.7)
MCV: 89 fL (ref 79–97)
MONOCYTES: 8 %
Monocytes Absolute: 0.3 10*3/uL (ref 0.1–0.9)
NEUTROS PCT: 56 %
Neutrophils Absolute: 2.5 10*3/uL (ref 1.4–7.0)
PLATELETS: 267 10*3/uL (ref 150–379)
RBC: 4.46 x10E6/uL (ref 3.77–5.28)
RDW: 12.1 % — ABNORMAL LOW (ref 12.3–15.4)
WBC: 4.3 10*3/uL (ref 3.4–10.8)

## 2015-11-30 LAB — LIPID PANEL WITH LDL/HDL RATIO
CHOLESTEROL TOTAL: 166 mg/dL (ref 100–199)
HDL: 68 mg/dL (ref >39)
LDL CALC: 88 mg/dL (ref 0–99)
LDl/HDL Ratio: 1.3 ratio units (ref 0.0–3.2)
Triglycerides: 49 mg/dL (ref 0–149)
VLDL CHOLESTEROL CAL: 10 mg/dL (ref 5–40)

## 2015-11-30 LAB — TSH: TSH: 1.44 u[IU]/mL (ref 0.450–4.500)

## 2015-12-03 ENCOUNTER — Telehealth: Payer: Self-pay

## 2015-12-03 NOTE — Telephone Encounter (Signed)
Patient advised as directed below.  Thanks,  -Dorinda Stehr 

## 2015-12-03 NOTE — Telephone Encounter (Signed)
LMTCB  Thanks,  -Joseline 

## 2015-12-03 NOTE — Telephone Encounter (Signed)
-----   Message from Margaretann LovelessJennifer M Burnette, [place]PA-C sent at 12/03/2015 10:45 AM EST ----- All labs are within normal limits and stable.  Thanks! -JB

## 2015-12-23 ENCOUNTER — Ambulatory Visit (INDEPENDENT_AMBULATORY_CARE_PROVIDER_SITE_OTHER): Payer: BLUE CROSS/BLUE SHIELD | Admitting: Vascular Surgery

## 2015-12-23 ENCOUNTER — Encounter (INDEPENDENT_AMBULATORY_CARE_PROVIDER_SITE_OTHER): Payer: Self-pay | Admitting: Vascular Surgery

## 2015-12-23 VITALS — BP 168/86 | HR 66 | Resp 15 | Wt 131.0 lb

## 2015-12-23 DIAGNOSIS — I872 Venous insufficiency (chronic) (peripheral): Secondary | ICD-10-CM

## 2015-12-23 DIAGNOSIS — I10 Essential (primary) hypertension: Secondary | ICD-10-CM | POA: Diagnosis not present

## 2015-12-23 DIAGNOSIS — M79604 Pain in right leg: Secondary | ICD-10-CM | POA: Diagnosis not present

## 2015-12-23 DIAGNOSIS — I8312 Varicose veins of left lower extremity with inflammation: Secondary | ICD-10-CM

## 2015-12-23 DIAGNOSIS — I8311 Varicose veins of right lower extremity with inflammation: Secondary | ICD-10-CM | POA: Diagnosis not present

## 2015-12-23 NOTE — Progress Notes (Signed)
MRN : [id number]  [name] is a 65 y.o. (08/15/1950) female who presents with chief complaint of  Chief Complaint  Patient presents with  . Re-evaluation    Post Laser  .  History of Present Illness: The patient returns to the office for followup status post laser ablation of the right great saphenous vein saphenous vein on 11/20/2015.  The patient note significant improvement in the lower extremity pain but not resolution of the symptoms. The patient notes multiple residual varicosities bilaterally which continued to hurt with dependent positions and remained tender to palpation. The patient's swelling is minimally from preoperative status. The patient continues to wear graduated compression stockings on a daily basis but these are not eliminating the pain and discomfort. The patient continues to use over-the-counter anti-inflammatory medications to treat the pain and related symptoms but this has not given the patient relief. The patient notes the pain in the lower extremities is causing problems with daily exercise, problems at work and even with household activities such as preparing meals and doing dishes.  The patient is otherwise done well and there have been no complications related to the laser procedure or interval changes in the patient's overall   Post laser ultrasound shows successful ablation of the right great saphenous vein    Current Meds  Medication Sig  . amitriptyline (ELAVIL) 50 MG tablet Take 1 tablet (50 mg total) by mouth daily.  . Calcium Carbonate-Vitamin D 600-200 MG-UNIT CAPS Take 2 tablets by mouth daily.  . Calcium Glycerophosphate (PRELIEF PO) Take 2 tablets by mouth daily. As needed   . diazepam (VALIUM) 10 MG tablet Take 5 mg by mouth as needed.   . fluticasone (FLONASE) 50 MCG/ACT nasal spray Place 2 sprays into both nostrils daily.  Marland Kitchen. MYRBETRIQ 50 MG TB24 tablet Take 50 mg by mouth daily.   . pantoprazole (PROTONIX) 40 MG tablet Take 1 tablet (40 mg  total) by mouth daily.  Marland Kitchen. triamterene-hydrochlorothiazide (MAXZIDE-25) 37.5-25 MG tablet Take 1 tablet by mouth daily.    Past Medical History:  Diagnosis Date  . Hyperlipidemia   . Hypertension     Past Surgical History:  Procedure Laterality Date  . CHOLECYSTECTOMY    . INSERTION OF MESH  2008   Trans vaginal mesh  . LASER ABLATION Right 11/21/2015   [facility] Dr. Gilda CreaseSchnier    Social History Social History  Substance Use Topics  . Smoking status: Former Smoker    Quit date: 01/20/1996  . Smokeless tobacco: Never Used  . Alcohol use No    Family History Family History  Problem Relation Age of Onset  . Cancer Father     Bladder  . Bladder Cancer Father   . Hypertension Father   . Coronary artery disease Father     Stent Placed 1990  . Liver disease Father   . Congestive Heart Failure Mother   . Healthy Sister   . Hypertension Sister   . Breast cancer Paternal Aunt   . Healthy Son   . Healthy Son     Allergies  Allergen Reactions  . Doxycycline Nausea Only  . Penicillins Rash  . Sulfa Antibiotics Rash     REVIEW OF SYSTEMS (Negative unless checked)  Constitutional: [] Weight loss  [] Fever  [] Chills Cardiac: [] Chest pain   [] Chest pressure   [] Palpitations   [] Shortness of breath when laying flat   [] Shortness of breath with exertion. Vascular:  [] Pain in legs with walking   [  x]Pain in legs at rest  [] History of DVT   [] Phlebitis   [x] Swelling in legs   [x] Varicose veins   [] Non-healing ulcers Pulmonary:   [] Uses home oxygen   [] Productive cough   [] Hemoptysis   [] Wheeze  [] COPD   [] Asthma Neurologic:  [] Dizziness   [] Seizures   [] History of stroke   [] History of TIA  [] Aphasia   [] Vissual changes   [] Weakness or numbness in arm   [] Weakness or numbness in leg Musculoskeletal:   [] Joint swelling   [] Joint pain   [] Low back pain Hematologic:  [] Easy bruising  [] Easy bleeding   [] Hypercoagulable state   [] Anemic Gastrointestinal:  [] Diarrhea    [] Vomiting  [] Gastroesophageal reflux/heartburn   [] Difficulty swallowing. Genitourinary:  [] Chronic kidney disease   [] Difficult urination  [] Frequent urination   [] Blood in urine Skin:  [] Rashes   [] Ulcers  Psychological:  [] History of anxiety   []  History of major depression.  Physical Examination  Vitals:   12/23/15 0901  BP: (!) 168/86  Pulse: 66  Resp: 15  Weight: 131 lb (59.4 kg)   Body mass index is 21.14 kg/m. Gen: WD/WN, NAD Head: Arthur/AT, No temporalis wasting.  Ear/Nose/Throat: Hearing grossly intact, nares w/o erythema or drainage, poor dentition Eyes: PER, EOMI, sclera nonicteric.  Neck: Supple, no masses.  No bruit or JVD.  Pulmonary:  Good air movement, clear to auscultation bilaterally, no use of accessory muscles.  Cardiac: RRR, normal S1, S2, no Murmurs. Vascular:  Large >8 mm varicosities right leg especially in the popliteal fossa Vessel Right Left  Radial Palpable Palpable  Ulnar Palpable Palpable  Brachial Palpable Palpable  Carotid Palpable Palpable  Femoral Palpable Palpable  Popliteal Palpable Palpable  PT Palpable Palpable  DP Palpable Palpable   Gastrointestinal: soft, non-distended. No guarding/no peritoneal signs.  Musculoskeletal: M/S 5/5 throughout.  No deformity or atrophy.  Neurologic: CN 2-12 intact. Pain and light touch intact in extremities.  Symmetrical.  Speech is fluent. Motor exam as listed above. Psychiatric: Judgment intact, Mood & affect appropriate for pt's clinical situation. Dermatologic: No rashes or ulcers noted.  No changes consistent with cellulitis. Lymph : No Cervical lymphadenopathy, no lichenification or skin changes of chronic lymphedema.  CBC Lab Results  Component Value Date   WBC 4.3 11/29/2015   HGB 13.7 04/21/2013   HCT 39.6 11/29/2015   MCV 89 11/29/2015   PLT 267 11/29/2015    BMET    Component Value Date/Time   NA 141 11/29/2015 0729   K 4.5 11/29/2015 0729   CL 99 11/29/2015 0729   CO2 27  11/29/2015 0729   GLUCOSE 103 (H) 11/29/2015 0729   BUN 15 11/29/2015 0729   CREATININE 0.75 11/29/2015 0729   CALCIUM 9.6 11/29/2015 0729   GFRNONAA 85 11/29/2015 0729   GFRAA 98 11/29/2015 0729   CrCl cannot be calculated (Patient's most recent lab result is older than the maximum 21 days allowed.).  COAG No results found for: INR, PROTIME  Radiology No results found.  Assessment/Plan 1. Varicose veins of both lower extremities with inflammation Recommend:  The patient has had successful ablation of the previously incompetent saphenous venous system but still has persistent symptoms of pain and swelling that are having a negative impact on daily life and daily activities.  Patient should undergo injection sclerotherapy to treat the residual varicosities.  The risks, benefits and alternative therapies were reviewed in detail with the patient.  All questions were answered.  The patient agrees to proceed with sclerotherapy at  their convenience.  The patient will continue wearing the graduated compression stockings and using the over-the-counter pain medications to treat her symptoms.    2. Chronic venous insufficiency No further surgery or intervention at this point in time.    I have had a long discussion with the patient regarding venous insufficiency and why it  causes symptoms. I have discussed with the patient the chronic skin changes that accompany venous insufficiency and the long term sequela such as infection and ulceration.  Patient will begin wearing graduated compression stockings class 1 (20-30 mmHg) or compression wraps on a daily basis a prescription was given. The patient will put the stockings on first thing in the morning and removing them in the evening. The patient is instructed specifically not to sleep in the stockings.    In addition, behavioral modification including several periods of elevation of the lower extremities during the day will be continued. I have  demonstrated that proper elevation is a position with the ankles at heart level.  The patient is instructed to begin routine exercise, especially walking on a daily basis  Patient should undergo duplex ultrasound of the venous system to ensure that DVT or reflux is not present.  3. Essential hypertension Continue antihypertensive medications as already ordered and reviewed, no changes at this time.  Continue statin as ordered and reviewed, no changes at this time  4. Pain of right lower extremity See # 1 and 2    Levora Dredge, MD  12/23/2015 9:05 AM

## 2015-12-26 ENCOUNTER — Encounter: Payer: Self-pay | Admitting: Physician Assistant

## 2015-12-26 DIAGNOSIS — Z1211 Encounter for screening for malignant neoplasm of colon: Secondary | ICD-10-CM

## 2016-01-02 ENCOUNTER — Ambulatory Visit
Admission: RE | Admit: 2016-01-02 | Discharge: 2016-01-02 | Disposition: A | Discharge status: Home / Self Care | Payer: BLUE CROSS/BLUE SHIELD | Source: Ambulatory Visit | Attending: Physician Assistant | Admitting: Physician Assistant

## 2016-01-02 ENCOUNTER — Telehealth: Payer: Self-pay

## 2016-01-02 DIAGNOSIS — Z1231 Encounter for screening mammogram for malignant neoplasm of breast: Secondary | ICD-10-CM | POA: Diagnosis not present

## 2016-01-02 NOTE — Telephone Encounter (Signed)
lmtcb Emily Drozdowski, CMA  

## 2016-01-02 NOTE — Telephone Encounter (Signed)
-----   Message from Margaretann LovelessJennifer M Burnette, [place]PA-C sent at 01/02/2016  1:53 PM EST ----- Normal mammogram. Repeat screening in one year.

## 2016-01-03 NOTE — Telephone Encounter (Signed)
Advised pt of results. Braidon Chermak Drozdowski, CMA  

## 2016-01-30 ENCOUNTER — Ambulatory Visit (INDEPENDENT_AMBULATORY_CARE_PROVIDER_SITE_OTHER): Payer: BLUE CROSS/BLUE SHIELD | Admitting: Vascular Surgery

## 2016-01-30 ENCOUNTER — Encounter (INDEPENDENT_AMBULATORY_CARE_PROVIDER_SITE_OTHER): Payer: Self-pay | Admitting: Vascular Surgery

## 2016-01-30 VITALS — BP 177/82 | HR 66 | Resp 16 | Ht 66.0 in | Wt 134.0 lb

## 2016-01-30 DIAGNOSIS — I8312 Varicose veins of left lower extremity with inflammation: Secondary | ICD-10-CM | POA: Diagnosis not present

## 2016-01-30 DIAGNOSIS — I8311 Varicose veins of right lower extremity with inflammation: Secondary | ICD-10-CM

## 2016-01-30 DIAGNOSIS — I872 Venous insufficiency (chronic) (peripheral): Secondary | ICD-10-CM

## 2016-01-30 NOTE — Progress Notes (Signed)
    MRN : [id number]  Earl LitesDiane M Friebel is a 66 y.o. (01/30/1950) female who presents with chief complaint of  Chief Complaint  Patient presents with  . Varicose Veins    Sclerotherapy right leg  .   Procedure:  Sclerotherapy using hypertonic saline mixed with 1% Lidocaine was performed on lower extremity right side.  Compression wraps were placed.  The patient tolerated the procedure well.  Plan:  Follow up as arranged

## 2016-02-03 ENCOUNTER — Encounter: Payer: Self-pay | Admitting: Physician Assistant

## 2016-02-04 ENCOUNTER — Encounter: Payer: Self-pay | Admitting: Physician Assistant

## 2016-02-10 ENCOUNTER — Encounter (INDEPENDENT_AMBULATORY_CARE_PROVIDER_SITE_OTHER): Payer: Self-pay | Admitting: Vascular Surgery

## 2016-02-10 ENCOUNTER — Ambulatory Visit (INDEPENDENT_AMBULATORY_CARE_PROVIDER_SITE_OTHER): Payer: BLUE CROSS/BLUE SHIELD | Admitting: Vascular Surgery

## 2016-02-10 VITALS — BP 154/88 | HR 76 | Resp 16 | Ht 67.0 in | Wt 132.0 lb

## 2016-02-10 DIAGNOSIS — I8311 Varicose veins of right lower extremity with inflammation: Secondary | ICD-10-CM | POA: Diagnosis not present

## 2016-02-10 DIAGNOSIS — I8312 Varicose veins of left lower extremity with inflammation: Secondary | ICD-10-CM | POA: Diagnosis not present

## 2016-02-10 NOTE — Progress Notes (Signed)
    MRN : [id number]  [name] is a 66 y.o. (08/05/1950) female who presents with chief complaint of  Chief Complaint  Patient presents with  . Varicose Veins    Right leg sclerotherapy  .   Procedure:  Sclerotherapy using hypertonic saline mixed with 1% Lidocaine was performed on lower extremities bilateral.  Compression wraps were placed.  The patient tolerated the procedure well.  Plan:  Follow up as arranged

## 2016-02-24 ENCOUNTER — Ambulatory Visit (INDEPENDENT_AMBULATORY_CARE_PROVIDER_SITE_OTHER): Payer: BLUE CROSS/BLUE SHIELD | Admitting: Vascular Surgery

## 2016-02-24 ENCOUNTER — Encounter (INDEPENDENT_AMBULATORY_CARE_PROVIDER_SITE_OTHER): Payer: Self-pay | Admitting: Vascular Surgery

## 2016-02-24 VITALS — BP 177/86 | HR 66 | Resp 16 | Wt 133.0 lb

## 2016-02-24 DIAGNOSIS — I8312 Varicose veins of left lower extremity with inflammation: Secondary | ICD-10-CM | POA: Diagnosis not present

## 2016-02-24 DIAGNOSIS — I8311 Varicose veins of right lower extremity with inflammation: Secondary | ICD-10-CM

## 2016-02-24 DIAGNOSIS — I872 Venous insufficiency (chronic) (peripheral): Secondary | ICD-10-CM

## 2016-02-24 NOTE — Progress Notes (Signed)
    MRN : [id number]  [name] is a 66 y.o. (03/05/1950) female who presents with chief complaint of No chief complaint on file. .   Procedure:  Sclerotherapy using hypertonic saline mixed with 1% Lidocaine was performed on lower extremities bilateral.  Compression wraps were placed.  The patient tolerated the procedure well.  Plan:  Follow up as arranged

## 2016-05-01 DIAGNOSIS — H3321 Serous retinal detachment, right eye: Secondary | ICD-10-CM | POA: Insufficient documentation

## 2016-05-25 ENCOUNTER — Telehealth: Payer: Self-pay

## 2016-05-25 ENCOUNTER — Encounter: Payer: Self-pay | Admitting: Physician Assistant

## 2016-05-25 ENCOUNTER — Ambulatory Visit (INDEPENDENT_AMBULATORY_CARE_PROVIDER_SITE_OTHER): Payer: BLUE CROSS/BLUE SHIELD | Admitting: Physician Assistant

## 2016-05-25 VITALS — BP 142/78 | HR 80 | Temp 97.6°F | Resp 16 | Wt 135.0 lb

## 2016-05-25 DIAGNOSIS — Z8249 Family history of ischemic heart disease and other diseases of the circulatory system: Secondary | ICD-10-CM | POA: Diagnosis not present

## 2016-05-25 DIAGNOSIS — R079 Chest pain, unspecified: Secondary | ICD-10-CM

## 2016-05-25 NOTE — Telephone Encounter (Signed)
Patient calling with c/o of feeling chest tightness and soreness after 15 minutes of starting walking. She reports it started 2-3 weeks ago.It doesn't happen everyday but reports it happened on Saturday and Sunday while walking fast. She reports that she walks everyday. The feeling of chest tightness and soreness goes away when she stops. Patient scheduled to see Antony ContrasJenni today at 1:30. She denies actual chest pain or SOB.  Thanks,  -Joseline

## 2016-05-25 NOTE — Patient Instructions (Signed)
Cardiopulmonary Exercise Stress Test Cardiopulmonary exercise testing (CPET) is a test that checks how your heart and lungs react to exercise. This is called your exercise capacity. During this test, you will walk or run on a treadmill or pedal on a stationary bike while tests are done on your heart and lungs. You may have this test to:  See why you are short of breath.  Check for exercise intolerance.  See how your lungs work.  See how your heart works.  Check for how you are responding to a heart or lung rehabilitation program.  See if you have a heart or lung problem.  See if you are healthy enough to have surgery. What happens before the procedure?  Follow instructions from your doctor about what you cannot eat or drink.  Ask your doctor about changing or stopping your normal medicines. This is important if you take diabetes medicines or blood thinners.  Wear loose, comfortable clothing and shoes.  If you use an inhaler, bring it with you to the test. What happens during the procedure?  A blood pressure cuff will be placed on your arm.  Several stick-on patches (electrodes) will be placed on your chest. They will be attached to an electrocardiogram (EKG) machine.  A clip-on monitor that measures the amount of oxygen in your blood will be placed on your finger (pulse oximeter).  A clip will be placed on your nose and a mouthpiece will be placed in your mouth. This may be held in place with a headpiece. You will breathe through the mouthpiece during the test.  You will be asked to start exercising. You will be closely watched while you exercise.  The amount of effort for your exercise will be gradually increased.  During exercise, the test will measure:  Your heart rate.  Your heart rhythm.  Your oxygen blood level.  The amount of oxygen and carbon dioxide that you breathe out.  The test will end when:  You have finished the test.  You have reached your maximum  ability to exercise.  You have chest or leg pain, dizziness, or shortness of breath. The procedure may vary among doctors and hospitals. What happens after the procedure?  Your blood pressure and EKG will be checked to watch how you recover from the test. This information is not intended to replace advice given to you by your health care provider. Make sure you discuss any questions you have with your health care provider. Document Released: 12/24/2008 Document Revised: 05/28/2015 Document Reviewed: 11/19/2014 Elsevier Interactive Patient Education  2017 [employer]

## 2016-05-25 NOTE — Progress Notes (Signed)
Patient: [name] Female    DOB: Feb 16, 1950   66 y.o.   MRN: [id number] Visit Date: 05/25/2016  Today's Provider: Margaretann Loveless, PA-C   Chief Complaint  Patient presents with  . Chest Pain   Subjective:    Chest Pain   This is a new problem. The current episode started 1 to 4 weeks ago (x 2 weeks). The pain is present in the substernal region. The pain is at a severity of 5/10. The pain is moderate. The quality of the pain is described as burning and tightness. The pain radiates to the left arm (radiation occured once). Associated symptoms include malaise/fatigue. Pertinent negatives include no abdominal pain, back pain, claudication, cough, diaphoresis, dizziness, exertional chest pressure, fever, headaches, hemoptysis, irregular heartbeat, leg pain, lower extremity edema, nausea, near-syncope, numbness, orthopnea, palpitations, shortness of breath, sputum production, syncope, vomiting or weakness. The pain is aggravated by exertion (about 15 minutes after pt starts walking; does not occur every time pt walks).  Her past medical history is significant for hypertension. Past medical history comments: H/O tobacco use  Her family medical history is significant for heart disease (CHF in mother, father had stent placement).  Pt reports the chest pain is improved after she walks home and is able to sit.    Allergies  Allergen Reactions  . Doxycycline Nausea Only  . Penicillins Rash  . Sulfa Antibiotics Rash     Current Outpatient Prescriptions:  .  amitriptyline (ELAVIL) 50 MG tablet, Take 1 tablet (50 mg total) by mouth daily., Disp: 90 tablet, Rfl: 3 .  Calcium Carbonate-Vitamin D 600-200 MG-UNIT CAPS, Take 2 tablets by mouth daily., Disp: , Rfl:  .  Calcium Glycerophosphate (PRELIEF PO), Take 2 tablets by mouth daily. As needed , Disp: , Rfl:  .  diazepam (VALIUM) 10 MG tablet, Take 5 mg by mouth as needed. , Disp: , Rfl:  .  fluticasone (FLONASE) 50 MCG/ACT nasal  spray, Place 2 sprays into both nostrils daily., Disp: 16 g, Rfl: 5 .  MYRBETRIQ 50 MG TB24 tablet, Take 50 mg by mouth daily. , Disp: , Rfl:  .  pantoprazole (PROTONIX) 40 MG tablet, Take 1 tablet (40 mg total) by mouth daily., Disp: 90 tablet, Rfl: 3 .  triamterene-hydrochlorothiazide (MAXZIDE-25) 37.5-25 MG tablet, Take 1 tablet by mouth daily., Disp: 90 tablet, Rfl: 3  Review of Systems  Constitutional: Positive for malaise/fatigue. Negative for diaphoresis and fever.  Respiratory: Positive for chest tightness. Negative for cough, hemoptysis, sputum production and shortness of breath.   Cardiovascular: Positive for chest pain. Negative for palpitations, orthopnea, claudication, syncope and near-syncope.  Gastrointestinal: Negative for abdominal pain, nausea and vomiting.  Musculoskeletal: Negative for back pain.  Neurological: Negative for dizziness, syncope, weakness, numbness and headaches.    Social History  Substance Use Topics  . Smoking status: Former Smoker    Packs/day: 1.00    Years: 35.00    Quit date: 01/20/1996  . Smokeless tobacco: Never Used  . Alcohol use No   Objective:   BP (!) 142/78 (BP Location: Left Arm, Patient Position: Sitting, Cuff Size: Normal)   Pulse 80   Temp 97.6 F (36.4 C) (Oral)   Resp 16   Wt 135 lb (61.2 kg)   SpO2 94%   BMI 21.14 kg/m  Vitals:   05/25/16 1333  BP: (!) 142/78  Pulse: 80  Resp: 16  Temp: 97.6 F (36.4 C)  TempSrc: Oral  SpO2: 94%  Weight: 135 lb (61.2 kg)    Physical Exam  Constitutional: She appears well-developed and well-nourished. No distress.  Neck: Normal range of motion. Neck supple. No JVD present. No tracheal deviation present. No thyromegaly present.  Cardiovascular: Normal rate, regular rhythm and normal heart sounds.  Exam reveals no gallop and no friction rub.   No murmur heard. Pulmonary/Chest: Effort normal and breath sounds normal. No respiratory distress. She has no wheezes. She has no rales.    Musculoskeletal: She exhibits no edema.  Lymphadenopathy:    She has no cervical adenopathy.  Skin: She is not diaphoretic.  Vitals reviewed.     Assessment & Plan:     1. Chest pain on exertion Possible new onset angina. Strong family history of CVD in mother and father. EKG reviewed by me and shows NSR 73. Essentially unchanged from EKG from 07/2006. Referral placed to Dr. Kirke CorinArida, cardiology (patient preference of physician) for consideration of exercise stress test. She is to call if symptoms worsen and take an 81 mg ASA daily.  - EKG 12-Lead - Ambulatory referral to Cardiology  2. Family history of cardiovascular disease See above medical treatment plan. - Ambulatory referral to Cardiology       Margaretann LovelessJennifer M Magdelene Ruark, PA-C  [facility]

## 2016-05-28 DIAGNOSIS — R079 Chest pain, unspecified: Secondary | ICD-10-CM | POA: Insufficient documentation

## 2016-07-02 ENCOUNTER — Other Ambulatory Visit: Payer: Self-pay | Admitting: Physician Assistant

## 2016-07-02 DIAGNOSIS — J301 Allergic rhinitis due to pollen: Secondary | ICD-10-CM

## 2016-07-21 ENCOUNTER — Ambulatory Visit: Payer: BLUE CROSS/BLUE SHIELD | Admitting: Cardiovascular Disease

## 2016-09-07 ENCOUNTER — Other Ambulatory Visit: Payer: Self-pay | Admitting: Physician Assistant

## 2016-09-07 DIAGNOSIS — I10 Essential (primary) hypertension: Secondary | ICD-10-CM

## 2016-09-07 DIAGNOSIS — K219 Gastro-esophageal reflux disease without esophagitis: Secondary | ICD-10-CM

## 2016-09-23 ENCOUNTER — Encounter: Payer: Self-pay | Admitting: *Deleted

## 2016-09-29 ENCOUNTER — Encounter
Admission: RE | Disposition: A | Discharge status: Home / Self Care | Payer: Self-pay | Source: Ambulatory Visit | Attending: Ophthalmology

## 2016-09-29 ENCOUNTER — Ambulatory Visit: Payer: BLUE CROSS/BLUE SHIELD | Admitting: Anesthesiology

## 2016-09-29 ENCOUNTER — Encounter: Payer: Self-pay | Admitting: *Deleted

## 2016-09-29 ENCOUNTER — Ambulatory Visit
Admission: RE | Admit: 2016-09-29 | Discharge: 2016-09-29 | Disposition: A | Discharge status: Home / Self Care | Payer: BLUE CROSS/BLUE SHIELD | Source: Ambulatory Visit | Attending: Ophthalmology | Admitting: Ophthalmology

## 2016-09-29 DIAGNOSIS — H2511 Age-related nuclear cataract, right eye: Secondary | ICD-10-CM | POA: Insufficient documentation

## 2016-09-29 DIAGNOSIS — Z87891 Personal history of nicotine dependence: Secondary | ICD-10-CM | POA: Diagnosis not present

## 2016-09-29 DIAGNOSIS — I10 Essential (primary) hypertension: Secondary | ICD-10-CM | POA: Insufficient documentation

## 2016-09-29 DIAGNOSIS — K219 Gastro-esophageal reflux disease without esophagitis: Secondary | ICD-10-CM | POA: Diagnosis not present

## 2016-09-29 DIAGNOSIS — Z79899 Other long term (current) drug therapy: Secondary | ICD-10-CM | POA: Insufficient documentation

## 2016-09-29 DIAGNOSIS — Z9841 Cataract extraction status, right eye: Secondary | ICD-10-CM | POA: Insufficient documentation

## 2016-09-29 DIAGNOSIS — Z7951 Long term (current) use of inhaled steroids: Secondary | ICD-10-CM | POA: Diagnosis not present

## 2016-09-29 HISTORY — DX: Nausea with vomiting, unspecified: R11.2

## 2016-09-29 HISTORY — DX: Other complications of anesthesia, initial encounter: T88.59XA

## 2016-09-29 HISTORY — DX: Interstitial cystitis (chronic) without hematuria: N30.10

## 2016-09-29 HISTORY — PX: CATARACT EXTRACTION W/PHACO: SHX586

## 2016-09-29 HISTORY — DX: Gastro-esophageal reflux disease without esophagitis: K21.9

## 2016-09-29 HISTORY — DX: Adverse effect of unspecified anesthetic, initial encounter: T41.45XA

## 2016-09-29 HISTORY — DX: Other specified postprocedural states: Z98.890

## 2016-09-29 SURGERY — PHACOEMULSIFICATION, CATARACT, WITH IOL INSERTION
Anesthesia: Monitor Anesthesia Care | Site: Eye | Laterality: Right | Wound class: Clean

## 2016-09-29 MED ORDER — FENTANYL CITRATE (PF) 100 MCG/2ML IJ SOLN
INTRAMUSCULAR | Status: DC | PRN
  Administered 2016-09-29: 50 ug via INTRAVENOUS

## 2016-09-29 MED ORDER — CARBACHOL 0.01 % IO SOLN
INTRAOCULAR | Status: DC | PRN
  Administered 2016-09-29: .5 mL via INTRAOCULAR

## 2016-09-29 MED ORDER — EPINEPHRINE PF 1 MG/ML IJ SOLN
INTRAMUSCULAR | Status: AC
  Filled 2016-09-29: qty 1

## 2016-09-29 MED ORDER — MOXIFLOXACIN HCL 0.5 % OP SOLN: 1.0000 [drp] | OPHTHALMIC | Status: DC | PRN

## 2016-09-29 MED ORDER — POVIDONE-IODINE 5 % OP SOLN
OPHTHALMIC | Status: DC | PRN
  Administered 2016-09-29: 1 via OPHTHALMIC

## 2016-09-29 MED ORDER — ARMC OPHTHALMIC DILATING DROPS
OPHTHALMIC | Status: AC
  Administered 2016-09-29: 1 via OPHTHALMIC
  Filled 2016-09-29: qty 0.4

## 2016-09-29 MED ORDER — MIDAZOLAM HCL 2 MG/2ML IJ SOLN
INTRAMUSCULAR | Status: AC
  Filled 2016-09-29: qty 2

## 2016-09-29 MED ORDER — LIDOCAINE HCL (PF) 4 % IJ SOLN
INTRAOCULAR | Status: DC | PRN
  Administered 2016-09-29: 2 mL via OPHTHALMIC

## 2016-09-29 MED ORDER — LIDOCAINE HCL (PF) 4 % IJ SOLN
INTRAMUSCULAR | Status: AC
  Filled 2016-09-29: qty 5

## 2016-09-29 MED ORDER — ARMC OPHTHALMIC DILATING DROPS
1.0000 "application " | OPHTHALMIC | Status: AC
  Administered 2016-09-29 (×3): 1 via OPHTHALMIC

## 2016-09-29 MED ORDER — EPINEPHRINE PF 1 MG/ML IJ SOLN
INTRAMUSCULAR | Status: DC | PRN
  Administered 2016-09-29: 1 mL via OPHTHALMIC

## 2016-09-29 MED ORDER — MOXIFLOXACIN HCL 0.5 % OP SOLN
OPHTHALMIC | Status: DC | PRN
  Administered 2016-09-29: .2 mL via OPHTHALMIC

## 2016-09-29 MED ORDER — FENTANYL CITRATE (PF) 100 MCG/2ML IJ SOLN
INTRAMUSCULAR | Status: AC
  Filled 2016-09-29: qty 2

## 2016-09-29 MED ORDER — SODIUM CHLORIDE 0.9 % IV SOLN
INTRAVENOUS | Status: DC
  Administered 2016-09-29: 07:00:00 via INTRAVENOUS

## 2016-09-29 MED ORDER — POVIDONE-IODINE 5 % OP SOLN
OPHTHALMIC | Status: AC
  Filled 2016-09-29: qty 30

## 2016-09-29 MED ORDER — NA CHONDROIT SULF-NA HYALURON 40-17 MG/ML IO SOLN
INTRAOCULAR | Status: AC
  Filled 2016-09-29: qty 1

## 2016-09-29 MED ORDER — MIDAZOLAM HCL 2 MG/2ML IJ SOLN
INTRAMUSCULAR | Status: DC | PRN
  Administered 2016-09-29: 0.5 mg via INTRAVENOUS

## 2016-09-29 MED ORDER — MOXIFLOXACIN HCL 0.5 % OP SOLN
OPHTHALMIC | Status: AC
  Filled 2016-09-29: qty 3

## 2016-09-29 MED ORDER — NA CHONDROIT SULF-NA HYALURON 40-17 MG/ML IO SOLN
INTRAOCULAR | Status: DC | PRN
  Administered 2016-09-29: 1 mL via INTRAOCULAR

## 2016-09-29 SURGICAL SUPPLY — 16 items
GLOVE BIO SURGEON STRL SZ8 (GLOVE) ×3 IMPLANT
GLOVE BIOGEL M 6.5 STRL (GLOVE) ×3 IMPLANT
GLOVE SURG LX 8.0 MICRO (GLOVE) ×2
GLOVE SURG LX STRL 8.0 MICRO (GLOVE) ×1 IMPLANT
GOWN STRL REUS W/ TWL LRG LVL3 (GOWN DISPOSABLE) ×2 IMPLANT
GOWN STRL REUS W/TWL LRG LVL3 (GOWN DISPOSABLE) ×6
LABEL CATARACT MEDS ST (LABEL) ×3 IMPLANT
LENS IOL TECNIS ITEC 21.0 (Intraocular Lens) ×2 IMPLANT
PACK CATARACT (MISCELLANEOUS) ×3 IMPLANT
PACK CATARACT BRASINGTON LX (MISCELLANEOUS) ×3 IMPLANT
PACK EYE AFTER SURG (MISCELLANEOUS) ×3 IMPLANT
SOL BSS BAG (MISCELLANEOUS) ×3
SOLUTION BSS BAG (MISCELLANEOUS) ×1 IMPLANT
SYR 5ML LL (SYRINGE) ×3 IMPLANT
WATER STERILE IRR 250ML POUR (IV SOLUTION) ×3 IMPLANT
WIPE NON LINTING 3.25X3.25 (MISCELLANEOUS) ×3 IMPLANT

## 2016-09-29 NOTE — Anesthesia Procedure Notes (Signed)
Procedure Name: MAC Date/Time: 09/29/2016 7:56 AM Performed by: Darlyne Russian Pre-anesthesia Checklist: Patient identified, Emergency Drugs available, Patient being monitored, Suction available and Timeout performed Patient Re-evaluated:Patient Re-evaluated prior to induction Oxygen Delivery Method: Nasal cannula Placement Confirmation: positive ETCO2

## 2016-09-29 NOTE — Op Note (Signed)
PREOPERATIVE DIAGNOSIS:  Nuclear sclerotic cataract of the right eye.   POSTOPERATIVE DIAGNOSIS:  nuclear sclerotic cataract right eye   OPERATIVE PROCEDURE: Procedure(s): CATARACT EXTRACTION PHACO AND INTRAOCULAR LENS PLACEMENT (IOC)   SURGEON:  Galen ManilaWilliam Camauri Fleece, MD.   ANESTHESIA:  Anesthesiologist: Lenard SimmerKarenz, Andrew, MD CRNA: Marlana SalvageJessup, Sandra, CRNA  1.      Managed anesthesia care. 2.      0.721ml of Shugarcaine was instilled in the eye following the paracentesis.   COMPLICATIONS:  None.   TECHNIQUE:   Stop and chop   DESCRIPTION OF PROCEDURE:  The patient was examined and consented in the preoperative holding area where the aforementioned topical anesthesia was applied to the right eye and then brought back to the Operating Room where the right eye was prepped and draped in the usual sterile ophthalmic fashion and a lid speculum was placed. A paracentesis was created with the side port blade and the anterior chamber was filled with viscoelastic. A near clear corneal incision was performed with the steel keratome. A continuous curvilinear capsulorrhexis was performed with a cystotome followed by the capsulorrhexis forceps. Hydrodissection and hydrodelineation were carried out with BSS on a blunt cannula. The lens was removed in a stop and chop  technique and the remaining cortical material was removed with the irrigation-aspiration handpiece. The capsular bag was inflated with viscoelastic and the Technis [id number]  lens was placed in the capsular bag without complication. The remaining viscoelastic was removed from the eye with the irrigation-aspiration handpiece. The wounds were hydrated. The anterior chamber was flushed with Miostat and the eye was inflated to physiologic pressure. 0.381ml of Vigamox was placed in the anterior chamber. The wounds were found to be water tight. The eye was dressed with Vigamox. The patient was given protective glasses to wear throughout the day and a shield with which to  sleep tonight. The patient was also given drops with which to begin a drop regimen today and will follow-up with me in one day.  Implant Name Type Inv. Item Serial No. Manufacturer Lot No. LRB No. Used  LENS IOL DIOP 21.0 - [id number] Intraocular Lens LENS IOL DIOP 21.0 [id number] AMO   Right 1   Procedure(s) with comments: CATARACT EXTRACTION PHACO AND INTRAOCULAR LENS PLACEMENT (IOC) (Right) - US 00:47 AP% 21.6 CDE 10.26 fluid pack lot # [id number]  Electronically signed: Yeraldy Spike LOUIS 09/29/2016 8:16 AM

## 2016-09-29 NOTE — H&P (Signed)
All labs reviewed. Abnormal studies sent to patients PCP when indicated.  Previous H&P reviewed, patient examined, there are NO CHANGES.  [name] LOUIS9/11/20187:51 AM

## 2016-09-29 NOTE — Anesthesia Postprocedure Evaluation (Signed)
Anesthesia Post Note  Patient: [name]  Procedure(s) Performed: Procedure(s) (LRB): CATARACT EXTRACTION PHACO AND INTRAOCULAR LENS PLACEMENT (IOC) (Right)  Patient location during evaluation: PACU Anesthesia Type: MAC Level of consciousness: awake and alert Pain management: pain level controlled Vital Signs Assessment: post-procedure vital signs reviewed and stable Respiratory status: spontaneous breathing, nonlabored ventilation, respiratory function stable and patient connected to nasal cannula oxygen Cardiovascular status: stable and blood pressure returned to baseline Anesthetic complications: no     Last Vitals:  Vitals:   09/29/16 0639 09/29/16 0818  BP: 130/86 137/72  Pulse: 92 78  Resp: 16 16  Temp: 36.9 C   SpO2: 99% 99%    Last Pain:  Vitals:   09/29/16 0818  TempSrc: Oral                 Darlyne Russian

## 2016-09-29 NOTE — Transfer of Care (Signed)
Immediate Anesthesia Transfer of Care Note  Patient: [name]  Procedure(s) Performed: Procedure(s) with comments: CATARACT EXTRACTION PHACO AND INTRAOCULAR LENS PLACEMENT (IOC) (Right) - [place] 00:47 AP% 21.6 CDE 10.26 fluid pack lot # [id number]  Patient Location: PACU  Anesthesia Type:MAC  Level of Consciousness: awake, alert  and oriented  Airway & Oxygen Therapy: Patient Spontanous Breathing  Post-op Assessment: Report given to RN and Post -op Vital signs reviewed and stable  Post vital signs: Reviewed and stable  Last Vitals:  Vitals:   09/29/16 0639 09/29/16 0818  BP: 130/86 137/72  Pulse: 92 78  Resp: 16 16  Temp: 36.9 C   SpO2: 99% 99%    Last Pain:  Vitals:   09/29/16 0818  TempSrc: Oral         Complications: No apparent anesthesia complications

## 2016-09-29 NOTE — Discharge Instructions (Signed)
Eye Surgery Discharge Instructions ° °Expect mild scratchy sensation or mild soreness. °DO NOT RUB YOUR EYE! ° °The day of surgery: °• Minimal physical activity, but bed rest is not required °• No reading, computer work, or close hand work °• No bending, lifting, or straining. °• May watch TV ° °For 24 hours: °• No driving, legal decisions, or alcoholic beverages °• Safety precautions °• Eat anything you prefer: It is better to start with liquids, then soup then solid foods. °• _____ Eye patch should be worn until postoperative exam tomorrow. °• ____ Solar shield eyeglasses should be worn for comfort in the sunlight/patch while sleeping ° °Resume all regular medications including aspirin or Coumadin if these were discontinued prior to surgery. °You may shower, bathe, shave, or wash your hair. °Tylenol may be taken for mild discomfort. ° °Call your doctor if you experience significant pain, nausea, or vomiting, fever > 101 or other signs of infection. [phone] or [phone] °Specific instructions: ° °Follow-up Information   ° [name], [name], MD Follow up on 09/30/2016.   °Specialty:  Ophthalmology °Why:  9:10 °Contact information: °[address] °[place] [address] °[phone] ° ° °  °  °  ° °

## 2016-09-29 NOTE — Anesthesia Post-op Follow-up Note (Signed)
Anesthesia QCDR form completed.        

## 2016-09-29 NOTE — Anesthesia Preprocedure Evaluation (Signed)
Anesthesia Evaluation  Patient identified by MRN, date of birth, ID band Patient awake    Reviewed: Allergy & Precautions, H&P , NPO status , Patient's Chart, lab work & pertinent test results, reviewed documented beta blocker date and time   History of Anesthesia Complications (+) PONV and history of anesthetic complications  Airway Mallampati: I  TM Distance: >3 FB Neck ROM: full    Dental  (+) Caps, Dental Advidsory Given   Pulmonary neg pulmonary ROS, former smoker,           Cardiovascular Exercise Tolerance: Good hypertension, (-) angina(-) CAD, (-) Past MI, (-) Cardiac Stents and (-) CABG (-) dysrhythmias (-) Valvular Problems/Murmurs     Neuro/Psych negative neurological ROS  negative psych ROS   GI/Hepatic Neg liver ROS, GERD  ,  Endo/Other  negative endocrine ROS  Renal/GU negative Renal ROS  negative genitourinary   Musculoskeletal   Abdominal   Peds  Hematology negative hematology ROS (+)   Anesthesia Other Findings Past Medical History: No date: Complication of anesthesia No date: GERD (gastroesophageal reflux disease) No date: Hyperlipidemia No date: Hypertension No date: IC (interstitial cystitis) No date: PONV (postoperative nausea and vomiting)     Comment:  NO PROBLEM     4/18 WITH RETINAL SURGERY   Reproductive/Obstetrics negative OB ROS                             Anesthesia Physical Anesthesia Plan  ASA: II  Anesthesia Plan: MAC   Post-op Pain Management:    Induction: Intravenous  PONV Risk Score and Plan: 3 and Midazolam  Airway Management Planned: Natural Airway and Nasal Cannula  Additional Equipment:   Intra-op Plan:   Post-operative Plan:   Informed Consent: I have reviewed the patients History and Physical, chart, labs and discussed the procedure including the risks, benefits and alternatives for the proposed anesthesia with the patient or  authorized representative who has indicated his/her understanding and acceptance.   Dental Advisory Given  Plan Discussed with: Anesthesiologist, CRNA and Surgeon  Anesthesia Plan Comments:         Anesthesia Quick Evaluation

## 2016-09-30 ENCOUNTER — Encounter: Payer: Self-pay | Admitting: Ophthalmology

## 2016-11-30 ENCOUNTER — Ambulatory Visit (INDEPENDENT_AMBULATORY_CARE_PROVIDER_SITE_OTHER): Payer: BLUE CROSS/BLUE SHIELD | Admitting: Physician Assistant

## 2016-11-30 ENCOUNTER — Other Ambulatory Visit: Payer: Self-pay

## 2016-11-30 ENCOUNTER — Encounter: Payer: Self-pay | Admitting: Physician Assistant

## 2016-11-30 VITALS — BP 148/88 | HR 78 | Resp 16 | Ht 66.0 in | Wt 133.6 lb

## 2016-11-30 DIAGNOSIS — Z1211 Encounter for screening for malignant neoplasm of colon: Secondary | ICD-10-CM | POA: Diagnosis not present

## 2016-11-30 DIAGNOSIS — N3010 Interstitial cystitis (chronic) without hematuria: Secondary | ICD-10-CM | POA: Diagnosis not present

## 2016-11-30 DIAGNOSIS — Z0000 Encounter for general adult medical examination without abnormal findings: Secondary | ICD-10-CM

## 2016-11-30 DIAGNOSIS — I10 Essential (primary) hypertension: Secondary | ICD-10-CM | POA: Diagnosis not present

## 2016-11-30 DIAGNOSIS — Z1239 Encounter for other screening for malignant neoplasm of breast: Secondary | ICD-10-CM

## 2016-11-30 DIAGNOSIS — Z124 Encounter for screening for malignant neoplasm of cervix: Secondary | ICD-10-CM | POA: Diagnosis not present

## 2016-11-30 DIAGNOSIS — Z1231 Encounter for screening mammogram for malignant neoplasm of breast: Secondary | ICD-10-CM

## 2016-11-30 NOTE — Progress Notes (Signed)
Patient: [name], Female    DOB: 05/12/1950, 66 y.o.   MRN: [id number] Visit Date: 11/30/2016  Today's Provider: Margaretann LovelessJennifer M Burnette, PA-C   Chief Complaint  Patient presents with  . Annual Exam   Subjective:    Annual physical exam [name] is a 66 y.o. female who presents today for health maintenance and complete physical. She feels well. She reports exercising. She reports she is sleeping well.  CPE:11/27/15 Mammogram:01/02/16 BI-RADS 1 -----------------------------------------------------------------   Review of Systems  Constitutional: Negative.   HENT: Positive for congestion and sinus pressure.   Eyes: Negative.   Respiratory: Negative.   Cardiovascular: Negative.   Gastrointestinal: Negative.   Endocrine: Negative.   Genitourinary: Negative.   Musculoskeletal: Negative.   Skin: Negative.   Allergic/Immunologic: Negative.   Neurological: Negative.   Hematological: Negative.   Psychiatric/Behavioral: Negative.     Social History      She  reports that she quit smoking about 20 years ago. She has a 35.00 pack-year smoking history. she has never used smokeless tobacco. She reports that she does not drink alcohol or use drugs.       Social History   Socioeconomic History  . Marital status: Married    Spouse name: None  . Number of children: 2  . Years of education: H/S  . Highest education level: None  Social Needs  . Financial resource strain: None  . Food insecurity - worry: None  . Food insecurity - inability: None  . Transportation needs - medical: None  . Transportation needs - non-medical: None  Occupational History  . Occupation: Full-time  Tobacco Use  . Smoking status: Former Smoker    Packs/day: 1.00    Years: 35.00    Pack years: 35.00    Last attempt to quit: 01/20/1996    Years since quitting: 20.8  . Smokeless tobacco: Never Used  Substance and Sexual Activity  . Alcohol use: No  . Drug use: No  . Sexual activity:  None  Other Topics Concern  . None  Social History Narrative  . None    Past Medical History:  Diagnosis Date  . Complication of anesthesia   . GERD (gastroesophageal reflux disease)   . Hyperlipidemia   . Hypertension   . IC (interstitial cystitis)   . PONV (postoperative nausea and vomiting)    NO PROBLEM     4/18 WITH RETINAL SURGERY     Patient Active Problem List   Diagnosis Date Noted  . Varicose veins of both lower extremities with inflammation 11/21/2015  . Chronic venous insufficiency 11/21/2015  . Pain in limb 11/21/2015  . Acid reflux 11/23/2014  . Allergic rhinitis 07/17/2014  . History of tobacco use 02/26/2006  . Chronic interstitial cystitis 02/22/2006  . Abdominal hernia with gangrene and obstruction 02/22/2006  . BP (high blood pressure) 04/22/1999    Past Surgical History:  Procedure Laterality Date  . BLADDER SUSPENSION     TRANSVAGINAL SLING  . CHOLECYSTECTOMY    . EYE SURGERY    . INSERTION OF MESH  2008   Trans vaginal mesh  . LASER ABLATION Right 11/21/2015   [facility] Dr. Gilda CreaseSchnier    Family History        Family Status  Relation Name Status  . Father  Deceased at age 28102       liver  . Mother  Deceased at age 65  . Sister  Alive  .  Emelda Brothers  (Not Specified)  . Son  Alive  . Son  Alive        Her family history includes Bladder Cancer in her father; Breast cancer in her paternal aunt; Cancer in her father; Congestive Heart Failure in her mother; Coronary artery disease in her father; Healthy in her sister, son, and son; Hypertension in her father and sister; Liver disease in her father.     Allergies  Allergen Reactions  . Doxycycline Nausea Only  . Penicillins Rash  . Sulfa Antibiotics Rash     Current Outpatient Medications:  .  amitriptyline (ELAVIL) 50 MG tablet, Take 1 tablet (50 mg total) by mouth daily., Disp: 90 tablet, Rfl: 1 .  Calcium Carbonate-Vitamin D 600-200 MG-UNIT CAPS, Take 2 tablets by mouth  daily., Disp: , Rfl:  .  Calcium Glycerophosphate (PRELIEF PO), Take 2 tablets by mouth daily. As needed , Disp: , Rfl:  .  diazepam (VALIUM) 10 MG tablet, Take 5 mg by mouth as needed. , Disp: , Rfl:  .  fluticasone (FLONASE) 50 MCG/ACT nasal spray, Place 2 sprays into both nostrils daily., Disp: 16 g, Rfl: 6 .  MYRBETRIQ 50 MG TB24 tablet, Take 50 mg by mouth daily. , Disp: , Rfl:  .  pantoprazole (PROTONIX) 40 MG tablet, Take 1 tablet (40 mg total) by mouth daily., Disp: 90 tablet, Rfl: 1 .  triamterene-hydrochlorothiazide (MAXZIDE-25) 37.5-25 MG tablet, Take 1 tablet by mouth daily., Disp: 90 tablet, Rfl: 1   Patient Care Team: Margaretann Loveless, PA-C as PCP - General (Family Medicine)      Objective:   Vitals: BP (!) 148/88 (BP Location: Left Arm, Patient Position: Sitting, Cuff Size: Normal) Comment: alka seltzer plus  Pulse 78   Resp 16   Ht 5\' 6"  (1.676 m)   Wt 133 lb 9.6 oz (60.6 kg)   BMI 21.56 kg/m     Physical Exam  Constitutional: She is oriented to person, place, and time. She appears well-developed and well-nourished. No distress.  HENT:  Head: Normocephalic and atraumatic.  Right Ear: Hearing, tympanic membrane, external ear and ear canal normal.  Left Ear: Hearing, tympanic membrane, external ear and ear canal normal.  Nose: Nose normal.  Mouth/Throat: Uvula is midline, oropharynx is clear and moist and mucous membranes are normal. No oropharyngeal exudate.  Eyes: Conjunctivae and EOM are normal. Pupils are equal, round, and reactive to light. Right eye exhibits no discharge. Left eye exhibits no discharge. No scleral icterus.  Neck: Normal range of motion. Neck supple. No JVD present. Carotid bruit is not present. No tracheal deviation present. No thyromegaly present.  Cardiovascular: Normal rate, regular rhythm, normal heart sounds and intact distal pulses. Exam reveals no gallop and no friction rub.  No murmur heard. Pulmonary/Chest: Effort normal and breath  sounds normal. No respiratory distress. She has no wheezes. She has no rales. She exhibits no tenderness. Right breast exhibits no inverted nipple, no mass, no nipple discharge, no skin change and no tenderness. Left breast exhibits no inverted nipple, no mass, no nipple discharge, no skin change and no tenderness. Breasts are symmetrical.  Abdominal: Soft. Bowel sounds are normal. She exhibits no distension and no mass. There is no tenderness. There is no rebound and no guarding. Hernia confirmed negative in the right inguinal area and confirmed negative in the left inguinal area.  Genitourinary: Rectum normal, vagina normal and uterus normal. No breast swelling, tenderness, discharge or bleeding. Pelvic exam was performed with patient  supine. There is no rash, tenderness, lesion or injury on the right labia. There is no rash, tenderness, lesion or injury on the left labia. Cervix exhibits no motion tenderness, no discharge and no friability. Right adnexum displays no mass, no tenderness and no fullness. Left adnexum displays no mass, no tenderness and no fullness. No erythema, tenderness or bleeding in the vagina. No signs of injury around the vagina. No vaginal discharge found.  Genitourinary Comments: Blue remnants noted from diazepam tablet for IC  Musculoskeletal: Normal range of motion. She exhibits no edema or tenderness.  Lymphadenopathy:    She has no cervical adenopathy.       Right: No inguinal adenopathy present.       Left: No inguinal adenopathy present.  Neurological: She is alert and oriented to person, place, and time. She has normal strength and normal reflexes. No cranial nerve deficit. Coordination normal.  Skin: Skin is warm and dry. No rash noted. She is not diaphoretic.  Psychiatric: She has a normal mood and affect. Her behavior is normal. Judgment and thought content normal.  Vitals reviewed.    Depression Screen PHQ 2/9 Scores 11/30/2016 11/27/2015  PHQ - 2 Score 0 0       Assessment & Plan:     Routine Health Maintenance and Physical Exam  Exercise Activities and Dietary recommendations Goals    None      Immunization History  Administered Date(s) Administered  . Influenza,inj,Quad PF,6+ Mos 10/20/2014  . Influenza-Unspecified 10/15/2015  . Tdap 10/10/2010  . Zoster 02/16/2015    Health Maintenance  Topic Date Due  . INFLUENZA VACCINE  08/19/2016  . PAP SMEAR  10/11/2016  . COLONOSCOPY  08/24/2017  . MAMMOGRAM  01/01/2018  . TETANUS/TDAP  10/09/2020  . Hepatitis C Screening  Completed  . HIV Screening  Discontinued     Discussed health benefits of physical activity, and encouraged her to engage in regular exercise appropriate for her age and condition.    1. Annual physical exam Normal physical exam today. Will check labs as below and f/u pending lab results. If labs are stable and WNL she will not need to have these rechecked for one year. Her next physical will be her welcome to medicare. She is to call the office in the meantime if she has any acute issue, questions or concerns. - CBC with Differential/Platelet - Comprehensive metabolic panel - Hemoglobin A1c - Lipid panel - TSH  2. Breast cancer screening Breast exam today was normal. There is no family history of breast cancer. She does perform regular self breast exams. Mammogram was ordered as below. [facility] so she may schedule her mammogram at her convenience. - MM DIGITAL SCREENING BILATERAL; Future  3. Cervical cancer screening Pap collected today. Will send as below and f/u pending results. - Pap IG and HPV (high risk) DNA detection  4. Colon cancer screening Cologuard ordered as below. Will f/u pending results.  - Cologuard  5. Essential hypertension Slightly elevated today due to using alka seltzer for sinus congestion. Otherwise has been stable. Will check labs as below and f/u pending results. Joycelyn ManJennifer  Burnette, PA-C - CBC with Differential/Platelet - Comprehensive metabolic panel  6. Chronic interstitial cystitis Stable. Diagnosis pulled for medication refill. Continue current medical treatment plan. - diazepam (VALIUM) 5 MG tablet; Insert one 5mg  tablet vaginally at bedtime  Dispense: 30 tablet; Refill: 0  --------------------------------------------------------------------    Margaretann LovelessJennifer M Burnette, PA-C  [facility]

## 2016-11-30 NOTE — Patient Instructions (Signed)
Health Maintenance for Postmenopausal Women Menopause is a normal process in which your reproductive ability comes to an end. This process happens gradually over a span of months to years, usually between the ages of 22 and 9. Menopause is complete when you have missed 12 consecutive menstrual periods. It is important to talk with your health care provider about some of the most common conditions that affect postmenopausal women, such as heart disease, cancer, and bone loss (osteoporosis). Adopting a healthy lifestyle and getting preventive care can help to promote your health and wellness. Those actions can also lower your chances of developing some of these common conditions. What should I know about menopause? During menopause, you may experience a number of symptoms, such as:  Moderate-to-severe hot flashes.  Night sweats.  Decrease in sex drive.  Mood swings.  Headaches.  Tiredness.  Irritability.  Memory problems.  Insomnia.  Choosing to treat or not to treat menopausal changes is an individual decision that you make with your health care provider. What should I know about hormone replacement therapy and supplements? Hormone therapy products are effective for treating symptoms that are associated with menopause, such as hot flashes and night sweats. Hormone replacement carries certain risks, especially as you become older. If you are thinking about using estrogen or estrogen with progestin treatments, discuss the benefits and risks with your health care provider. What should I know about heart disease and stroke? Heart disease, heart attack, and stroke become more likely as you age. This may be due, in part, to the hormonal changes that your body experiences during menopause. These can affect how your body processes dietary fats, triglycerides, and cholesterol. Heart attack and stroke are both medical emergencies. There are many things that you can do to help prevent heart disease  and stroke:  Have your blood pressure checked at least every 1-2 years. High blood pressure causes heart disease and increases the risk of stroke.  If you are 53-22 years old, ask your health care provider if you should take aspirin to prevent a heart attack or a stroke.  Do not use any tobacco products, including cigarettes, chewing tobacco, or electronic cigarettes. If you need help quitting, ask your health care provider.  It is important to eat a healthy diet and maintain a healthy weight. ? Be sure to include plenty of vegetables, fruits, low-fat dairy products, and lean protein. ? Avoid eating foods that are high in solid fats, added sugars, or salt (sodium).  Get regular exercise. This is one of the most important things that you can do for your health. ? Try to exercise for at least 150 minutes each week. The type of exercise that you do should increase your heart rate and make you sweat. This is known as moderate-intensity exercise. ? Try to do strengthening exercises at least twice each week. Do these in addition to the moderate-intensity exercise.  Know your numbers.Ask your health care provider to check your cholesterol and your blood glucose. Continue to have your blood tested as directed by your health care provider.  What should I know about cancer screening? There are several types of cancer. Take the following steps to reduce your risk and to catch any cancer development as early as possible. Breast Cancer  Practice breast self-awareness. ? This means understanding how your breasts normally appear and feel. ? It also means doing regular breast self-exams. Let your health care provider know about any changes, no matter how small.  If you are 40  or older, have a clinician do a breast exam (clinical breast exam or CBE) every year. Depending on your age, family history, and medical history, it may be recommended that you also have a yearly breast X-ray (mammogram).  If you  have a family history of breast cancer, talk with your health care provider about genetic screening.  If you are at high risk for breast cancer, talk with your health care provider about having an MRI and a mammogram every year.  Breast cancer (BRCA) gene test is recommended for women who have family members with BRCA-related cancers. Results of the assessment will determine the need for genetic counseling and BRCA1 and for BRCA2 testing. BRCA-related cancers include these types: ? Breast. This occurs in males or females. ? Ovarian. ? Tubal. This may also be called fallopian tube cancer. ? Cancer of the abdominal or pelvic lining (peritoneal cancer). ? Prostate. ? Pancreatic.  Cervical, Uterine, and Ovarian Cancer Your health care provider may recommend that you be screened regularly for cancer of the pelvic organs. These include your ovaries, uterus, and vagina. This screening involves a pelvic exam, which includes checking for microscopic changes to the surface of your cervix (Pap test).  For women ages 21-65, health care providers may recommend a pelvic exam and a Pap test every three years. For women ages 79-65, they may recommend the Pap test and pelvic exam, combined with testing for human papilloma virus (HPV), every five years. Some types of HPV increase your risk of cervical cancer. Testing for HPV may also be done on women of any age who have unclear Pap test results.  Other health care providers may not recommend any screening for nonpregnant women who are considered low risk for pelvic cancer and have no symptoms. Ask your health care provider if a screening pelvic exam is right for you.  If you have had past treatment for cervical cancer or a condition that could lead to cancer, you need Pap tests and screening for cancer for at least 20 years after your treatment. If Pap tests have been discontinued for you, your risk factors (such as having a new sexual partner) need to be  reassessed to determine if you should start having screenings again. Some women have medical problems that increase the chance of getting cervical cancer. In these cases, your health care provider may recommend that you have screening and Pap tests more often.  If you have a family history of uterine cancer or ovarian cancer, talk with your health care provider about genetic screening.  If you have vaginal bleeding after reaching menopause, tell your health care provider.  There are currently no reliable tests available to screen for ovarian cancer.  Lung Cancer Lung cancer screening is recommended for adults 69-62 years old who are at high risk for lung cancer because of a history of smoking. A yearly low-dose CT scan of the lungs is recommended if you:  Currently smoke.  Have a history of at least 30 pack-years of smoking and you currently smoke or have quit within the past 15 years. A pack-year is smoking an average of one pack of cigarettes per day for one year.  Yearly screening should:  Continue until it has been 15 years since you quit.  Stop if you develop a health problem that would prevent you from having lung cancer treatment.  Colorectal Cancer  This type of cancer can be detected and can often be prevented.  Routine colorectal cancer screening usually begins at  age 42 and continues through age 45.  If you have risk factors for colon cancer, your health care provider may recommend that you be screened at an earlier age.  If you have a family history of colorectal cancer, talk with your health care provider about genetic screening.  Your health care provider may also recommend using home test kits to check for hidden blood in your stool.  A small camera at the end of a tube can be used to examine your colon directly (sigmoidoscopy or colonoscopy). This is done to check for the earliest forms of colorectal cancer.  Direct examination of the colon should be repeated every  5-10 years until age 71. However, if early forms of precancerous polyps or small growths are found or if you have a family history or genetic risk for colorectal cancer, you may need to be screened more often.  Skin Cancer  Check your skin from head to toe regularly.  Monitor any moles. Be sure to tell your health care provider: ? About any new moles or changes in moles, especially if there is a change in a mole's shape or color. ? If you have a mole that is larger than the size of a pencil eraser.  If any of your family members has a history of skin cancer, especially at a young age, talk with your health care provider about genetic screening.  Always use sunscreen. Apply sunscreen liberally and repeatedly throughout the day.  Whenever you are outside, protect yourself by wearing long sleeves, pants, a wide-brimmed hat, and sunglasses.  What should I know about osteoporosis? Osteoporosis is a condition in which bone destruction happens more quickly than new bone creation. After menopause, you may be at an increased risk for osteoporosis. To help prevent osteoporosis or the bone fractures that can happen because of osteoporosis, the following is recommended:  If you are 46-71 years old, get at least 1,000 mg of calcium and at least 600 mg of vitamin D per day.  If you are older than age 55 but younger than age 65, get at least 1,200 mg of calcium and at least 600 mg of vitamin D per day.  If you are older than age 54, get at least 1,200 mg of calcium and at least 800 mg of vitamin D per day.  Smoking and excessive alcohol intake increase the risk of osteoporosis. Eat foods that are rich in calcium and vitamin D, and do weight-bearing exercises several times each week as directed by your health care provider. What should I know about how menopause affects my mental health? Depression may occur at any age, but it is more common as you become older. Common symptoms of depression  include:  Low or sad mood.  Changes in sleep patterns.  Changes in appetite or eating patterns.  Feeling an overall lack of motivation or enjoyment of activities that you previously enjoyed.  Frequent crying spells.  Talk with your health care provider if you think that you are experiencing depression. What should I know about immunizations? It is important that you get and maintain your immunizations. These include:  Tetanus, diphtheria, and pertussis (Tdap) booster vaccine.  Influenza every year before the flu season begins.  Pneumonia vaccine.  Shingles vaccine.  Your health care provider may also recommend other immunizations. This information is not intended to replace advice given to you by your health care provider. Make sure you discuss any questions you have with your health care provider. Document Released: 02/27/2005  Document Revised: 07/26/2015 Document Reviewed: 10/09/2014 Elsevier Interactive Patient Education  2018 [employer] Inc[employer]  

## 2016-12-01 ENCOUNTER — Telehealth: Payer: Self-pay

## 2016-12-01 LAB — CBC WITH DIFFERENTIAL/PLATELET
BASOS ABS: 56 {cells}/uL (ref 0–200)
Basophils Relative: 0.5 %
EOS PCT: 1.1 %
Eosinophils Absolute: 122 cells/uL (ref 15–500)
HEMATOCRIT: 40.8 % (ref 35.0–45.0)
Hemoglobin: 14.2 g/dL (ref 11.7–15.5)
LYMPHS ABS: 1598 {cells}/uL (ref 850–3900)
MCH: 30.7 pg (ref 27.0–33.0)
MCHC: 34.8 g/dL (ref 32.0–36.0)
MCV: 88.3 fL (ref 80.0–100.0)
MPV: 11.5 fL (ref 7.5–12.5)
Monocytes Relative: 6.7 %
NEUTROS PCT: 77.3 %
Neutro Abs: 8580 cells/uL — ABNORMAL HIGH (ref 1500–7800)
Platelets: 278 10*3/uL (ref 140–400)
RBC: 4.62 10*6/uL (ref 3.80–5.10)
RDW: 11.8 % (ref 11.0–15.0)
Total Lymphocyte: 14.4 %
WBC mixed population: 744 cells/uL (ref 200–950)
WBC: 11.1 10*3/uL — ABNORMAL HIGH (ref 3.8–10.8)

## 2016-12-01 LAB — COMPLETE METABOLIC PANEL WITH GFR
AG Ratio: 1.5 (calc) (ref 1.0–2.5)
ALKALINE PHOSPHATASE (APISO): 81 U/L (ref 33–130)
ALT: 14 U/L (ref 6–29)
AST: 19 U/L (ref 10–35)
Albumin: 4.7 g/dL (ref 3.6–5.1)
BILIRUBIN TOTAL: 0.4 mg/dL (ref 0.2–1.2)
BUN: 15 mg/dL (ref 7–25)
CO2: 32 mmol/L (ref 20–32)
Calcium: 9.6 mg/dL (ref 8.6–10.4)
Chloride: 95 mmol/L — ABNORMAL LOW (ref 98–110)
Creat: 0.66 mg/dL (ref 0.50–0.99)
GFR, Est African American: 108 mL/min/{1.73_m2} (ref >=60)
GFR, Est Non African American: 93 mL/min/{1.73_m2} (ref >=60)
GLUCOSE: 88 mg/dL (ref 65–139)
Globulin: 3.2 g/dL (calc) (ref 1.9–3.7)
Potassium: 4.1 mmol/L (ref 3.5–5.3)
Sodium: 134 mmol/L — ABNORMAL LOW (ref 135–146)
Total Protein: 7.9 g/dL (ref 6.1–8.1)

## 2016-12-01 LAB — LIPID PANEL
Cholesterol: 181 mg/dL (ref <200)
HDL: 82 mg/dL (ref >50)
LDL Cholesterol (Calc): 85 mg/dL (calc)
Non-HDL Cholesterol (Calc): 99 mg/dL (calc) (ref <130)
Total CHOL/HDL Ratio: 2.2 (calc) (ref <5.0)
Triglycerides: 56 mg/dL (ref <150)

## 2016-12-01 LAB — HEMOGLOBIN A1C
Hgb A1c MFr Bld: 5.2 % of total Hgb (ref <5.7)
Mean Plasma Glucose: 103 (calc)
eAG (mmol/L): 5.7 (calc)

## 2016-12-01 LAB — TSH: TSH: 1.34 m[IU]/L (ref 0.40–4.50)

## 2016-12-01 NOTE — Telephone Encounter (Signed)
lmtcb

## 2016-12-01 NOTE — Telephone Encounter (Signed)
-----   Message from Margaretann LovelessJennifer M Burnette, [place]PA-C sent at 12/01/2016  9:47 AM EST ----- WBC count is borderline high most likely secondary to sinus symptoms you were having. Cholesterol stable and WNL. TSH WNL. Sodium and chloride borderline low, recommend just adding a little salt to foods. All other labs are WNL and stable.

## 2016-12-01 NOTE — Telephone Encounter (Signed)
Patient advised as below.  

## 2016-12-03 ENCOUNTER — Telehealth: Payer: Self-pay | Admitting: Physician Assistant

## 2016-12-03 ENCOUNTER — Telehealth: Payer: Self-pay

## 2016-12-03 LAB — PAP IG AND HPV HIGH-RISK: HPV DNA HIGH RISK: NOT DETECTED

## 2016-12-03 NOTE — Telephone Encounter (Signed)
-----   Message from Margaretann LovelessJennifer M Burnette, PA-C sent at 12/03/2016  9:41 AM EST ----- Pap is normal, HPV negative. No need to repeat unless issues arise or if patient desires.

## 2016-12-03 NOTE — Telephone Encounter (Signed)
Order for cologuard faxed to [facility] °

## 2016-12-03 NOTE — Telephone Encounter (Signed)
Patient advised as directed below.  Thanks,  -Joseline 

## 2016-12-22 ENCOUNTER — Encounter: Payer: Self-pay | Admitting: Physician Assistant

## 2016-12-22 ENCOUNTER — Ambulatory Visit (INDEPENDENT_AMBULATORY_CARE_PROVIDER_SITE_OTHER): Payer: Medicare Other | Admitting: Physician Assistant

## 2016-12-22 VITALS — BP 122/70 | HR 76 | Temp 97.7°F | Resp 16 | Wt 130.0 lb

## 2016-12-22 DIAGNOSIS — N898 Other specified noninflammatory disorders of vagina: Secondary | ICD-10-CM

## 2016-12-22 DIAGNOSIS — B373 Candidiasis of vulva and vagina: Secondary | ICD-10-CM

## 2016-12-22 DIAGNOSIS — B3731 Acute candidiasis of vulva and vagina: Secondary | ICD-10-CM

## 2016-12-22 LAB — POCT WET PREP (WET MOUNT): Trichomonas Wet Prep HPF POC: ABSENT

## 2016-12-22 MED ORDER — FLUCONAZOLE 150 MG PO TABS: ORAL_TABLET | ORAL | 0 refills | Status: DC

## 2016-12-22 NOTE — Progress Notes (Signed)
Patient: Laura LitesDiane M Moss Female    DOB: 06/05/1950   66 y.o.   MRN: [id number] Visit Date: 12/22/2016  Today's Provider: Trey SailorsAdriana M Adya Wirz, PA-C   Chief Complaint  Patient presents with  . Vaginitis    Started about three days ago   Subjective:    Vaginal Pain  The patient's primary symptoms include a genital odor. The patient's pertinent negatives include no vaginal bleeding or vaginal discharge. This is a new problem. The current episode started in the past 7 days. The problem occurs constantly. The problem has been unchanged. She is not pregnant. Pertinent negatives include no abdominal pain, anorexia, back pain, chills, constipation, diarrhea, discolored urine, dysuria, fever, flank pain, frequency, hematuria, nausea, urgency or vomiting.       Allergies  Allergen Reactions  . Doxycycline Nausea Only  . Penicillins Rash  . Sulfa Antibiotics Rash     Current Outpatient Medications:  .  amitriptyline (ELAVIL) 50 MG tablet, Take 1 tablet (50 mg total) by mouth daily., Disp: 90 tablet, Rfl: 1 .  Calcium Carbonate-Vitamin D 600-200 MG-UNIT CAPS, Take 2 tablets by mouth daily., Disp: , Rfl:  .  Calcium Glycerophosphate (PRELIEF PO), Take 2 tablets by mouth daily. As needed , Disp: , Rfl:  .  diazepam (VALIUM) 5 MG tablet, Insert one 5mg  tablet vaginally at bedtime, Disp: 30 tablet, Rfl: 0 .  fluticasone (FLONASE) 50 MCG/ACT nasal spray, Place 2 sprays into both nostrils daily., Disp: 16 g, Rfl: 6 .  MYRBETRIQ 50 MG TB24 tablet, Take 50 mg by mouth daily. , Disp: , Rfl:  .  pantoprazole (PROTONIX) 40 MG tablet, Take 1 tablet (40 mg total) by mouth daily., Disp: 90 tablet, Rfl: 1 .  triamterene-hydrochlorothiazide (MAXZIDE-25) 37.5-25 MG tablet, Take 1 tablet by mouth daily., Disp: 90 tablet, Rfl: 1  Review of Systems  Constitutional: Negative.  Negative for chills and fever.  Gastrointestinal: Negative.  Negative for abdominal pain, anorexia, constipation, diarrhea, nausea and  vomiting.  Genitourinary: Positive for vaginal pain. Negative for decreased urine volume, difficulty urinating, dysuria, enuresis, flank pain, frequency, genital sores, hematuria, urgency, vaginal bleeding and vaginal discharge.  Musculoskeletal: Negative for back pain.    Social History   Tobacco Use  . Smoking status: Former Smoker    Packs/day: 1.00    Years: 35.00    Pack years: 35.00    Last attempt to quit: 01/20/1996    Years since quitting: 20.9  . Smokeless tobacco: Never Used  Substance Use Topics  . Alcohol use: No   Objective:   BP 122/70 (BP Location: Left Arm, Patient Position: Sitting, Cuff Size: Normal)   Pulse 76   Temp 97.7 F (36.5 C) (Oral)   Resp 16   Wt 130 lb (59 kg)   BMI 20.98 kg/m  Vitals:   12/22/16 1041  BP: 122/70  Pulse: 76  Resp: 16  Temp: 97.7 F (36.5 C)  TempSrc: Oral  Weight: 130 lb (59 kg)     Physical Exam  Constitutional: She appears well-developed and well-nourished.  Genitourinary: Cervix exhibits no discharge. No erythema, tenderness or bleeding in the vagina. No foreign body in the vagina. No signs of injury around the vagina. Vaginal discharge found.        Assessment & Plan:     1. Vaginal candida  - fluconazole (DIFLUCAN) 150 MG tablet; Take one pill today. Take second pill if still symptomatic in three days.  Dispense: 2 tablet; Refill: 0  2. Vaginal irritation  Wet prep shows hyphae, no clue cells.  - POCT Wet Prep [facility]([place])  Return if symptoms worsen or fail to improve.  The entirety of the information documented in the History of Present Illness, Review of Systems and Physical Exam were personally obtained by me. Portions of this information were initially documented by Kavin LeechLaura Walsh, CMA and reviewed by me for thoroughness and accuracy.        Trey SailorsAdriana M Briya Lookabaugh, PA-C  [facility]

## 2016-12-22 NOTE — Patient Instructions (Signed)

## 2017-01-05 ENCOUNTER — Telehealth: Payer: Self-pay | Admitting: Physician Assistant

## 2017-01-05 ENCOUNTER — Other Ambulatory Visit: Payer: Self-pay | Admitting: Physician Assistant

## 2017-01-05 DIAGNOSIS — B373 Candidiasis of vulva and vagina: Secondary | ICD-10-CM

## 2017-01-05 DIAGNOSIS — B3731 Acute candidiasis of vulva and vagina: Secondary | ICD-10-CM

## 2017-01-05 MED ORDER — FLUCONAZOLE 150 MG PO TABS: ORAL_TABLET | ORAL | 0 refills | Status: DC

## 2017-01-05 NOTE — Telephone Encounter (Signed)
Did the fluconazole help at all? Is she still having discharge? If is much of the same, we might try another round. If is different, would recommend office visit. If it doesn't resolve would recommend coming in so we can do a swab and send it to the lab.

## 2017-01-05 NOTE — Telephone Encounter (Signed)
Pt stated after her OV on 12/22/16 she completed fluconazole (DIFLUCAN) 150 MG tablet. Pt is requesting a call back to see if she needs another OV or if she should try another round of fluconazole (DIFLUCAN) 150 MG tablet. Pt hung up before I could ask which pharmacy. Please advise. Thanks TNP

## 2017-01-05 NOTE — Telephone Encounter (Signed)
[facility] [place]

## 2017-01-05 NOTE — Addendum Note (Signed)
Addended by: Trey SailorsPOLLAK, ADRIANA M on: 01/05/2017 03:10 PM   Modules accepted: Orders

## 2017-01-05 NOTE — Telephone Encounter (Signed)
Pt reports the Fluconazole did help she took one and then another one three days later.  She says the discharge is the same as before.  She would like to try one more round of Fluconazole, but did agree to come back in if her symptoms do not resolve.   Thanks,   -Vernona RiegerLaura

## 2017-01-22 ENCOUNTER — Ambulatory Visit
Admission: RE | Admit: 2017-01-22 | Discharge: 2017-01-22 | Disposition: A | Discharge status: Home / Self Care | Payer: Medicare Other | Source: Ambulatory Visit | Attending: Physician Assistant | Admitting: Physician Assistant

## 2017-01-22 ENCOUNTER — Encounter: Payer: Self-pay | Admitting: Physician Assistant

## 2017-01-22 ENCOUNTER — Ambulatory Visit (INDEPENDENT_AMBULATORY_CARE_PROVIDER_SITE_OTHER): Payer: Medicare Other | Admitting: Physician Assistant

## 2017-01-22 VITALS — BP 144/84 | HR 74 | Temp 97.6°F | Resp 16 | Wt 132.0 lb

## 2017-01-22 DIAGNOSIS — R928 Other abnormal and inconclusive findings on diagnostic imaging of breast: Secondary | ICD-10-CM | POA: Insufficient documentation

## 2017-01-22 DIAGNOSIS — N898 Other specified noninflammatory disorders of vagina: Secondary | ICD-10-CM

## 2017-01-22 DIAGNOSIS — N952 Postmenopausal atrophic vaginitis: Secondary | ICD-10-CM | POA: Diagnosis not present

## 2017-01-22 DIAGNOSIS — Z1231 Encounter for screening mammogram for malignant neoplasm of breast: Secondary | ICD-10-CM | POA: Insufficient documentation

## 2017-01-22 DIAGNOSIS — Z1239 Encounter for other screening for malignant neoplasm of breast: Secondary | ICD-10-CM

## 2017-01-22 NOTE — Progress Notes (Signed)
Patient: [name] Female    DOB: Jan 31, 1950   67 y.o.   MRN: [id number] Visit Date: 01/22/2017  Today's Provider: Margaretann Loveless, PA-C   Chief Complaint  Patient presents with  . Vaginal Discharge   Subjective:    HPI Patient is here today C/O vaginal discharge x's 2 days. Patient reports symptoms are worse when she is sitting. Patient denies any painful urination. Patient was seen last month and treated for vaginal candida with diflucan. Patient reports good tolerance and compliance with medication.     Allergies  Allergen Reactions  . Doxycycline Nausea Only  . Penicillins Rash  . Sulfa Antibiotics Rash     Current Outpatient Medications:  .  amitriptyline (ELAVIL) 50 MG tablet, Take 1 tablet (50 mg total) by mouth daily., Disp: 90 tablet, Rfl: 1 .  Calcium Carbonate-Vitamin D 600-200 MG-UNIT CAPS, Take 2 tablets by mouth daily., Disp: , Rfl:  .  Calcium Glycerophosphate (PRELIEF PO), Take 2 tablets by mouth daily. As needed , Disp: , Rfl:  .  fluticasone (FLONASE) 50 MCG/ACT nasal spray, Place 2 sprays into both nostrils daily., Disp: 16 g, Rfl: 6 .  MYRBETRIQ 50 MG TB24 tablet, Take 50 mg by mouth daily. , Disp: , Rfl:  .  pantoprazole (PROTONIX) 40 MG tablet, Take 1 tablet (40 mg total) by mouth daily., Disp: 90 tablet, Rfl: 1 .  triamterene-hydrochlorothiazide (MAXZIDE-25) 37.5-25 MG tablet, Take 1 tablet by mouth daily., Disp: 90 tablet, Rfl: 1 .  diazepam (VALIUM) 5 MG tablet, Insert one 5mg  tablet vaginally at bedtime, Disp: 30 tablet, Rfl: 0  Review of Systems  Constitutional: Negative.   Respiratory: Negative.   Cardiovascular: Negative.   Gastrointestinal: Negative.   Genitourinary: Positive for vaginal discharge and vaginal pain. Negative for dysuria, enuresis, flank pain, frequency, hematuria and menstrual problem.  Musculoskeletal: Negative.     Social History   Tobacco Use  . Smoking status: Former Smoker    Packs/day: 1.00    Years:  35.00    Pack years: 35.00    Last attempt to quit: 01/20/1996    Years since quitting: 21.0  . Smokeless tobacco: Never Used  Substance Use Topics  . Alcohol use: No   Objective:   BP (!) 144/84 (BP Location: Left Arm, Patient Position: Sitting, Cuff Size: Normal)   Pulse 74   Temp 97.6 F (36.4 C) (Oral)   Resp 16   Wt 132 lb (59.9 kg)   SpO2 99%   BMI 21.31 kg/m  Vitals:   01/22/17 1059  BP: (!) 144/84  Pulse: 74  Resp: 16  Temp: 97.6 F (36.4 C)  TempSrc: Oral  SpO2: 99%  Weight: 132 lb (59.9 kg)     Physical Exam  Constitutional: She appears well-developed and well-nourished. No distress.  Neck: Normal range of motion. Neck supple. No JVD present. No tracheal deviation present. No thyromegaly present.  Cardiovascular: Normal rate, regular rhythm and normal heart sounds. Exam reveals no gallop and no friction rub.  No murmur heard. Pulmonary/Chest: Effort normal and breath sounds normal. No respiratory distress. She has no wheezes. She has no rales.  Abdominal: Soft. Bowel sounds are normal. She exhibits no mass. There is no tenderness.  Genitourinary: There is no rash, tenderness, lesion or injury on the right labia. There is no rash, tenderness, lesion or injury on the left labia. There is erythema and tenderness in the vagina. No vaginal discharge found.  Lymphadenopathy:  She has no cervical adenopathy.  Skin: She is not diaphoretic.  Vitals reviewed.     Assessment & Plan:     1. Vaginal irritation Nuswab collected today. Suspect atrophic vaginitis. If NuSwab is negative will send in Rx for topical estrogen cream to see if symptoms improve.   -NuSwab BV and Candida, NAA       Margaretann LovelessJennifer M Lynda Capistran, PA-C  [facility]

## 2017-01-22 NOTE — Patient Instructions (Signed)
Atrophic Vaginitis Atrophic vaginitis is a condition in which the tissues that line the vagina become dry and thin. This condition is most common in women who have stopped having regular menstrual periods (menopause). This usually starts when a woman is 45-66 years old. Estrogen helps to keep the vagina moist. It stimulates the vagina to produce a clear fluid that lubricates the vagina for sexual intercourse. This fluid also protects the vagina from infection. Lack of estrogen can cause the lining of the vagina to get thinner and dryer. The vagina may also shrink in size. It may become less elastic. Atrophic vaginitis tends to get worse over time as a woman's estrogen level drops. What are the causes? This condition is caused by the normal drop in estrogen that happens around the time of menopause. What increases the risk? Certain conditions or situations may lower a woman's estrogen level, which increases her risk of atrophic vaginitis. These include:  Taking medicine that blocks estrogen.  Having ovaries removed surgically.  Being treated for cancer with X-ray treatment (radiation) or medicines (chemotherapy).  Exercising very hard and often.  Having an eating disorder (anorexia).  Giving birth or breastfeeding.  Being over the age of 50.  Smoking.  What are the signs or symptoms? Symptoms of this condition include:  Pain, soreness, or bleeding during sexual intercourse (dyspareunia).  Vaginal burning, irritation, or itching.  Pain or bleeding during a vaginal examination using a speculum (pelvic exam).  Loss of interest in sexual activity.  Having burning pain when passing urine.  Vaginal discharge that is brown or yellow.  In some cases, there are no symptoms. How is this diagnosed? This condition is diagnosed with a medical history and physical exam. This will include a pelvic exam that checks whether the inside of your vagina appears pale, thin, or dry. Rarely, you may  also have other tests, including:  A urine test.  A test that checks the acid balance in your vaginal fluid (acid balance test).  How is this treated? Treatment for this condition may depend on the severity of your symptoms. Treatment may include:  Using an over-the-counter vaginal lubricant before you have sexual intercourse.  Using a long-acting vaginal moisturizer.  Using low-dose vaginal estrogen for moderate to severe symptoms that do not respond to other treatments. Options include creams, tablets, and inserts (vaginal rings). Before using vaginal estrogen, tell your health care provider if you have a history of: ? Breast cancer. ? Endometrial cancer. ? Blood clots.  Taking medicines. You may be able to take a daily pill for dyspareunia. Discuss all of the risks of this medicine with your health care provider. It is usually not recommended for women who have a family history or personal history of breast cancer.  If your symptoms are very mild and you are not sexually active, you may not need treatment. Follow these instructions at home:  Take medicines only as directed by your health care provider. Do not use herbal or alternative medicines unless your health care provider says that you can.  Use over-the-counter creams, lubricants, or moisturizers for dryness only as directed by your health care provider.  If your atrophic vaginitis is caused by menopause, discuss all of your menopausal symptoms and treatment options with your health care provider.  Do not douche.  Do not use products that can make your vagina dry. These include: ? Scented feminine sprays. ? Scented tampons. ? Scented soaps.  If it hurts to have sex, talk with your sexual   partner. Contact a health care provider if:  Your discharge looks different than normal.  Your vagina has an unusual smell.  You have new symptoms.  Your symptoms do not improve with treatment.  Your symptoms get worse. This  information is not intended to replace advice given to you by your health care provider. Make sure you discuss any questions you have with your health care provider. Document Released: 05/22/2014 Document Revised: 06/13/2015 Document Reviewed: 12/27/2013 Elsevier Interactive Patient Education  2018 [employer] Inc.  

## 2017-01-25 ENCOUNTER — Telehealth: Payer: Self-pay

## 2017-01-25 ENCOUNTER — Other Ambulatory Visit: Payer: Self-pay | Admitting: Physician Assistant

## 2017-01-25 DIAGNOSIS — R928 Other abnormal and inconclusive findings on diagnostic imaging of breast: Secondary | ICD-10-CM

## 2017-01-25 DIAGNOSIS — N6310 Unspecified lump in the right breast, unspecified quadrant: Secondary | ICD-10-CM

## 2017-01-25 DIAGNOSIS — N952 Postmenopausal atrophic vaginitis: Secondary | ICD-10-CM

## 2017-01-25 DIAGNOSIS — Z1211 Encounter for screening for malignant neoplasm of colon: Secondary | ICD-10-CM | POA: Diagnosis not present

## 2017-01-25 LAB — NUSWAB BV AND CANDIDA, NAA
CANDIDA GLABRATA, NAA: NEGATIVE
Candida albicans, NAA: NEGATIVE

## 2017-01-25 MED ORDER — ESTRADIOL 0.1 MG/GM VA CREA: 1.0000 | TOPICAL_CREAM | VAGINAL | 12 refills | Status: DC

## 2017-01-25 MED ORDER — ESTROGENS, CONJUGATED 0.625 MG/GM VA CREA: 1.0000 | TOPICAL_CREAM | Freq: Every day | VAGINAL | 12 refills | Status: DC

## 2017-01-25 NOTE — Telephone Encounter (Signed)
Pt states her estradiol cream is over $250. She is requesting an alternative.

## 2017-01-25 NOTE — Telephone Encounter (Signed)
Sent in premarin cream.

## 2017-01-25 NOTE — Telephone Encounter (Signed)
Please Review.  Thanks,  -Joseline 

## 2017-01-25 NOTE — Telephone Encounter (Signed)
Pt advised.   Thanks,   -Emanuelle Bastos  

## 2017-01-25 NOTE — Telephone Encounter (Signed)
-----   Message from Margaretann LovelessJennifer M Burnette, [place] sent at 01/25/2017 12:43 PM EST ----- All testing on swab was negative. Suspect atrophic vaginitis as we discussed. I will send in a topical estrogen for you to try.

## 2017-01-25 NOTE — Addendum Note (Signed)
Addended by: Margaretann LovelessBURNETTE, JENNIFER M on: 01/25/2017 12:45 PM   Modules accepted: Orders

## 2017-01-25 NOTE — Telephone Encounter (Signed)
Sent in premarin

## 2017-01-25 NOTE — Telephone Encounter (Signed)
Patient advised.  Thanks,  -[name] 

## 2017-02-04 ENCOUNTER — Telehealth: Payer: Self-pay

## 2017-02-04 ENCOUNTER — Ambulatory Visit
Admission: RE | Admit: 2017-02-04 | Discharge: 2017-02-04 | Disposition: A | Discharge status: Home / Self Care | Payer: Medicare Other | Source: Ambulatory Visit | Attending: Physician Assistant | Admitting: Physician Assistant

## 2017-02-04 ENCOUNTER — Encounter: Payer: Self-pay | Admitting: Physician Assistant

## 2017-02-04 DIAGNOSIS — R928 Other abnormal and inconclusive findings on diagnostic imaging of breast: Secondary | ICD-10-CM | POA: Diagnosis not present

## 2017-02-04 DIAGNOSIS — N6011 Diffuse cystic mastopathy of right breast: Secondary | ICD-10-CM | POA: Diagnosis not present

## 2017-02-04 DIAGNOSIS — N6310 Unspecified lump in the right breast, unspecified quadrant: Secondary | ICD-10-CM | POA: Diagnosis present

## 2017-02-04 DIAGNOSIS — N6001 Solitary cyst of right breast: Secondary | ICD-10-CM | POA: Diagnosis not present

## 2017-02-04 LAB — COLOGUARD: Cologuard: POSITIVE

## 2017-02-04 NOTE — Telephone Encounter (Signed)
Patient advised of positive Cologuard result and need for GI referral. Patient requested to hold off on GI referral. Patient reports she will call us back with name of GI she wants to see. sd

## 2017-02-05 NOTE — Telephone Encounter (Signed)
Please review

## 2017-02-09 ENCOUNTER — Telehealth: Payer: Self-pay | Admitting: Physician Assistant

## 2017-02-09 DIAGNOSIS — R195 Other fecal abnormalities: Secondary | ICD-10-CM

## 2017-02-09 NOTE — Telephone Encounter (Signed)
Patient was told by our office that she needed a colonoscopy so she is calling back to inform us of who she wants to see.   She is saying to please refer her [facility].

## 2017-02-09 NOTE — Telephone Encounter (Signed)
Referral placed.

## 2017-02-16 DIAGNOSIS — H3321 Serous retinal detachment, right eye: Secondary | ICD-10-CM | POA: Diagnosis not present

## 2017-03-18 DIAGNOSIS — K219 Gastro-esophageal reflux disease without esophagitis: Secondary | ICD-10-CM | POA: Diagnosis not present

## 2017-03-18 DIAGNOSIS — R195 Other fecal abnormalities: Secondary | ICD-10-CM | POA: Diagnosis not present

## 2017-03-18 DIAGNOSIS — R112 Nausea with vomiting, unspecified: Secondary | ICD-10-CM | POA: Diagnosis not present

## 2017-03-18 DIAGNOSIS — Z9889 Other specified postprocedural states: Secondary | ICD-10-CM | POA: Diagnosis not present

## 2017-03-31 ENCOUNTER — Encounter: Payer: Self-pay | Admitting: Physician Assistant

## 2017-03-31 DIAGNOSIS — N898 Other specified noninflammatory disorders of vagina: Secondary | ICD-10-CM

## 2017-03-31 NOTE — Addendum Note (Signed)
Addended by: Margaretann LovelessBURNETTE, JENNIFER M on: 03/31/2017 04:28 PM   Modules accepted: Orders

## 2017-04-01 DIAGNOSIS — R300 Dysuria: Secondary | ICD-10-CM | POA: Diagnosis not present

## 2017-04-01 DIAGNOSIS — R102 Pelvic and perineal pain: Secondary | ICD-10-CM | POA: Diagnosis not present

## 2017-04-02 DIAGNOSIS — M3501 Sicca syndrome with keratoconjunctivitis: Secondary | ICD-10-CM | POA: Diagnosis not present

## 2017-04-12 ENCOUNTER — Encounter: Payer: Self-pay | Admitting: Obstetrics and Gynecology

## 2017-04-12 ENCOUNTER — Ambulatory Visit (INDEPENDENT_AMBULATORY_CARE_PROVIDER_SITE_OTHER): Payer: Medicare Other | Admitting: Obstetrics and Gynecology

## 2017-04-12 VITALS — BP 150/90 | HR 69 | Ht 66.0 in | Wt 132.0 lb

## 2017-04-12 DIAGNOSIS — R102 Pelvic and perineal pain: Secondary | ICD-10-CM | POA: Diagnosis not present

## 2017-04-12 DIAGNOSIS — N898 Other specified noninflammatory disorders of vagina: Secondary | ICD-10-CM | POA: Diagnosis not present

## 2017-04-12 MED ORDER — CLOBETASOL PROPIONATE 0.05 % EX OINT: TOPICAL_OINTMENT | CUTANEOUS | 1 refills | Status: DC

## 2017-04-12 NOTE — Progress Notes (Signed)
HPI:      [name] is a 67 y.o. [phone] who LMP was No LMP recorded. Patient is postmenopausal., presents today for a problem visit.  She complains of vulvar pain and irritation since December of 2018. She has been deen several times by family practice for this issue. She has tried diflucan and estradiol cream, but with no relief. She reports that her bottom is very sore and that it hurts to sit. She denies changes in vaginal discharge. She denies malodor. She denies vulvar itching. She describes the pain by saying that it feel like someone has rubbed sandpaper on her bottom. She reports that she does ws her bottom with soap or bodywash daily. She does not currently wear all cotton underwear. She denies a history of abnormal pap smears. She denies a history of any skin disorders such as melanoma, psoriasis, or eczema. She denies musculoskeletal pain.  PMHx: She  has a past medical history of Complication of anesthesia, GERD (gastroesophageal reflux disease), Hyperlipidemia, Hypertension, IC (interstitial cystitis), and PONV (postoperative nausea and vomiting). Also,  has a past surgical history that includes Cholecystectomy; Insertion of mesh (2008); Laser ablation (Right, 11/21/2015); Eye surgery; Bladder suspension; and Cataract extraction w/PHACO (Right, 09/29/2016)., family history includes Bladder Cancer in her father; Breast cancer in her paternal aunt; Cancer in her father; Congestive Heart Failure in her mother; Coronary artery disease in her father; Healthy in her sister, son, and son; Hypertension in her father and sister; Liver disease in her father.,  reports that she quit smoking about 21 years ago. She has a 35.00 pack-year smoking history. She has never used smokeless tobacco. She reports that she does not drink alcohol or use drugs.  She has a current medication list which includes the following prescription(s): amitriptyline, calcium carbonate-vitamin d, calcium glycerophosphate,  estradiol, fluticasone, mirabegron er, pantoprazole, triamterene-hydrochlorothiazide, and clobetasol ointment. Also, is allergic to doxycycline; penicillins; and sulfa antibiotics.    Review of Systems  Constitutional: Negative for chills, fever, malaise/fatigue and weight loss.  HENT: Negative for congestion, hearing loss and sinus pain.   Eyes: Negative for blurred vision and double vision.  Respiratory: Negative for cough, sputum production, shortness of breath and wheezing.   Cardiovascular: Negative for chest pain, palpitations, orthopnea and leg swelling.  Gastrointestinal: Negative for abdominal pain, constipation, diarrhea, nausea and vomiting.  Genitourinary: Negative for dysuria, flank pain, frequency, hematuria and urgency.  Musculoskeletal: Negative for back pain, falls and joint pain.  Skin: Negative for itching and rash.  Neurological: Negative for dizziness and headaches.  Psychiatric/Behavioral: Negative for depression, substance abuse and suicidal ideas. The patient is not nervous/anxious.     Objective: BP (!) 150/90   Pulse 69   Ht 5\' 6"  (1.676 m)   Wt 132 lb (59.9 kg)   BMI 21.31 kg/m  Physical Exam  Constitutional: She is oriented to person, place, and time. She appears well-developed.  Genitourinary: Vagina normal and uterus normal. There is no lesion on the right labia. There is no lesion on the left labia. Vagina exhibits no lesion. Right adnexum does not display mass. Left adnexum does not display mass. Cervix does not exhibit motion tenderness.  Genitourinary Comments: Normal appearance of external genitalia. Hyperpigmentation of labia. No erythema. Patient points to the areas lateral to her labia and at the top of her buttocks as the skin that is tender and sore. This appears to be a more muscular area and no noticeable skin changes are present.    Normal  appearing cervix. Normal vagina. Normal uterus on bimaual exam. No adnexal masses appreciated.   HENT:    Head: Normocephalic and atraumatic.  Eyes: EOM are normal.  Neck: Neck supple. No thyromegaly present.  Cardiovascular: Normal rate, regular rhythm and normal heart sounds.  Pulmonary/Chest: Effort normal and breath sounds normal. Right breast exhibits no inverted nipple, no mass, no nipple discharge and no skin change. Left breast exhibits no inverted nipple, no mass, no nipple discharge and no skin change.  Abdominal: Soft. Bowel sounds are normal. She exhibits no distension and no mass.  Neurological: She is alert and oriented to person, place, and time.  Skin: Skin is warm and dry.  Psychiatric: She has a normal mood and affect. Her behavior is normal. Judgment and thought content normal.  Vitals reviewed.   ASSESSMENT/PLAN:    Problem List Items Addressed This Visit    None    Visit Diagnoses    Vaginal pain    -  Primary   Relevant Orders   NuSwab BV and Candida, NAA   Vaginal irritation       Relevant Orders   NuSwab BV and Candida, NAA      Painful perineum, otherwise normal in appearance. Patient can trial high dose steroid cream, but I am not certain that this will provide her with relief since tissues appear to have no skin changes. I also recommended dermoplast spray. Nuswab sent to rule out infectious causes of irritation. Discussed with the patient eliminating soap and body wash application to her bottom. Discussed twice a day sitz baths followed by application of Vaseline or the steroid ointment as part of a soak and seal regimen.  Will follow up with patient in 1 month.     Adelene Idlerhristanna Truong Delcastillo MD [facility], [facility] 04/12/17 3:59 PM

## 2017-04-14 LAB — NUSWAB BV AND CANDIDA, NAA
CANDIDA GLABRATA, NAA: NEGATIVE
Candida albicans, NAA: NEGATIVE

## 2017-04-14 NOTE — Progress Notes (Signed)
Discussed with patient on the phone.

## 2017-05-10 ENCOUNTER — Encounter: Payer: Self-pay | Admitting: Obstetrics and Gynecology

## 2017-05-10 ENCOUNTER — Ambulatory Visit (INDEPENDENT_AMBULATORY_CARE_PROVIDER_SITE_OTHER): Payer: Medicare Other | Admitting: Obstetrics and Gynecology

## 2017-05-10 VITALS — BP 140/70 | HR 68 | Ht 66.0 in | Wt 132.0 lb

## 2017-05-10 DIAGNOSIS — R102 Pelvic and perineal pain: Secondary | ICD-10-CM | POA: Diagnosis not present

## 2017-05-10 DIAGNOSIS — N904 Leukoplakia of vulva: Secondary | ICD-10-CM

## 2017-05-10 NOTE — Progress Notes (Signed)
Patient ID: [name], female   DOB: 08/05/1950, 67 y.o.   MRN: [id number]  Reason for Consult: Follow-up (vaginal irritation)   Referred by [name], Jennifer M, P*  Subjective:     HPI:  [name] is a 67 y.o. female. Audreena is being followed for persistent vulvar pain.  She reports that she has some resolution but not complete resolution of her symptoms.  She estimates that 3-4 days of the week she experiences vulvar pain with the other 3 days being symptom-free.  She reports that she has been treating herself with the topical high potency steroid the Estrace cream and sitz bath's following the soak and seal regimen about 1 time a day.   Past Medical History:  Diagnosis Date  . Complication of anesthesia   . GERD (gastroesophageal reflux disease)   . Hyperlipidemia   . Hypertension   . IC (interstitial cystitis)   . PONV (postoperative nausea and vomiting)    NO PROBLEM     4/18 WITH RETINAL SURGERY   Family History  Problem Relation Age of Onset  . Cancer Father        Bladder  . Bladder Cancer Father   . Hypertension Father   . Coronary artery disease Father        Stent Placed 1990  . Liver disease Father   . Congestive Heart Failure Mother   . Healthy Sister   . Hypertension Sister   . Breast cancer Paternal Aunt   . Healthy Son   . Healthy Son    Past Surgical History:  Procedure Laterality Date  . BLADDER SUSPENSION     TRANSVAGINAL SLING  . CATARACT EXTRACTION W/PHACO Right 09/29/2016   Procedure: CATARACT EXTRACTION PHACO AND INTRAOCULAR LENS PLACEMENT (IOC);  Surgeon: Galen ManilaPorfilio, [name], MD;  Location: [facility] ORS;  Service: Ophthalmology;  Laterality: Right;  US 00:47 AP% 21.6 CDE 10.26 fluid pack lot # [id number]  . CHOLECYSTECTOMY    . EYE SURGERY    . INSERTION OF MESH  2008   Trans vaginal mesh  . LASER ABLATION Right 11/21/2015   [facility] Dr. Lesly DukesSchnier    Short Social History:  Social History   Tobacco Use  . Smoking status:  Former Smoker    Packs/day: 1.00    Years: 35.00    Pack years: 35.00    Last attempt to quit: 01/20/1996    Years since quitting: 21.3  . Smokeless tobacco: Never Used  Substance Use Topics  . Alcohol use: No    Allergies  Allergen Reactions  . Doxycycline Nausea Only  . Penicillins Rash  . Sulfa Antibiotics Rash    Current Outpatient Medications  Medication Sig Dispense Refill  . amitriptyline (ELAVIL) 50 MG tablet Take 1 tablet (50 mg total) by mouth daily. 90 tablet 1  . Calcium Carbonate-Vitamin D 600-200 MG-UNIT CAPS Take 2 tablets by mouth daily.    . Calcium Glycerophosphate (PRELIEF PO) Take 2 tablets by mouth daily. As needed     . clobetasol ointment (TEMOVATE) 0.05 % Apply to affected area every night for 4 weeks, then every other day for 4 weeks and then twice a week for 4 weeks or until resolution. 30 g 1  . estradiol (ESTRACE) 0.1 MG/GM vaginal cream     . fluticasone (FLONASE) 50 MCG/ACT nasal spray Place 2 sprays into both nostrils daily. 16 g 6  . mirabegron ER (MYRBETRIQ) 25 MG TB24 tablet Take by mouth.    .Marland Kitchen  pantoprazole (PROTONIX) 40 MG tablet Take 1 tablet (40 mg total) by mouth daily. 90 tablet 1  . triamterene-hydrochlorothiazide (MAXZIDE-25) 37.5-25 MG tablet Take 1 tablet by mouth daily. 90 tablet 1   No current facility-administered medications for this visit.     Review of Systems  Constitutional: Negative for chills, fatigue, fever and unexpected weight change.  HENT: Negative for trouble swallowing.  Eyes: Negative for loss of vision.  Respiratory: Negative for cough, shortness of breath and wheezing.  Cardiovascular: Negative for chest pain, leg swelling, palpitations and syncope.  GI: Negative for abdominal pain, blood in stool, diarrhea, nausea and vomiting.  GU: Negative for difficulty urinating, dysuria, frequency and hematuria.  Musculoskeletal: Negative for back pain, leg pain and joint pain.  Skin: Negative for rash.  Neurological:  Negative for dizziness, headaches, light-headedness, numbness and seizures.  Psychiatric: Negative for behavioral problem, confusion, depressed mood and sleep disturbance.        Objective:  Objective   Vitals:   05/10/17 0807  BP: 140/70  Pulse: 68  Weight: 132 lb (59.9 kg)  Height: 5\' 6"  (1.676 m)   Body mass index is 21.31 kg/m.  Physical Exam  Constitutional: She is oriented to person, place, and time. She appears well-developed and well-nourished.  HENT:  Head: Normocephalic and atraumatic.  Eyes: EOM are normal.  Cardiovascular: Normal rate, regular rhythm and normal heart sounds.  Pulmonary/Chest: Effort normal and breath sounds normal.  Genitourinary:  Genitourinary Comments: Vulvar pain is located just lateral to introitus, some small raising of the skin and slight red discoloration, very minimal and mostly normal appearance. No focal lesions masses or growths.  No discharge.   Neurological: She is alert and oriented to person, place, and time.  Skin: Skin is warm and dry.  Psychiatric: She has a normal mood and affect. Her behavior is normal. Judgment and thought content normal.  Nursing note and vitals reviewed.       Assessment/Plan:     Lichen Simplex vs lichen chronicus, will continue treatment with high potency steroids daily for 12 weeks. She will return after 8 more weeks and if needed we can try treatment with local injection of lidocaine and triamcinolone.    Adelene Idler MD [facility], [facility] 05/10/17 8:37 AM

## 2017-05-31 ENCOUNTER — Encounter: Payer: Self-pay | Admitting: Physician Assistant

## 2017-06-03 ENCOUNTER — Other Ambulatory Visit: Payer: Self-pay

## 2017-06-03 ENCOUNTER — Encounter: Payer: Self-pay | Admitting: Physician Assistant

## 2017-06-03 ENCOUNTER — Ambulatory Visit (INDEPENDENT_AMBULATORY_CARE_PROVIDER_SITE_OTHER): Payer: Medicare Other | Admitting: Physician Assistant

## 2017-06-03 VITALS — BP 119/78 | Wt 129.0 lb

## 2017-06-03 DIAGNOSIS — Z23 Encounter for immunization: Secondary | ICD-10-CM

## 2017-06-03 DIAGNOSIS — K219 Gastro-esophageal reflux disease without esophagitis: Secondary | ICD-10-CM

## 2017-06-03 DIAGNOSIS — N3010 Interstitial cystitis (chronic) without hematuria: Secondary | ICD-10-CM | POA: Diagnosis not present

## 2017-06-03 DIAGNOSIS — M810 Age-related osteoporosis without current pathological fracture: Secondary | ICD-10-CM

## 2017-06-03 DIAGNOSIS — Z0000 Encounter for general adult medical examination without abnormal findings: Secondary | ICD-10-CM | POA: Diagnosis not present

## 2017-06-03 DIAGNOSIS — I10 Essential (primary) hypertension: Secondary | ICD-10-CM

## 2017-06-03 MED ORDER — TRIAMTERENE-HCTZ 37.5-25 MG PO TABS: 1.0000 | ORAL_TABLET | Freq: Every day | ORAL | 1 refills | Status: DC

## 2017-06-03 MED ORDER — AMITRIPTYLINE HCL 50 MG PO TABS: ORAL_TABLET | ORAL | 1 refills | Status: DC

## 2017-06-03 MED ORDER — PANTOPRAZOLE SODIUM 40 MG PO TBEC: 40.0000 mg | DELAYED_RELEASE_TABLET | Freq: Every day | ORAL | 1 refills | Status: DC

## 2017-06-03 NOTE — Patient Instructions (Signed)
Health Maintenance for Postmenopausal Women Menopause is a normal process in which your reproductive ability comes to an end. This process happens gradually over a span of months to years, usually between the ages of 22 and 9. Menopause is complete when you have missed 12 consecutive menstrual periods. It is important to talk with your health care provider about some of the most common conditions that affect postmenopausal women, such as heart disease, cancer, and bone loss (osteoporosis). Adopting a healthy lifestyle and getting preventive care can help to promote your health and wellness. Those actions can also lower your chances of developing some of these common conditions. What should I know about menopause? During menopause, you may experience a number of symptoms, such as:  Moderate-to-severe hot flashes.  Night sweats.  Decrease in sex drive.  Mood swings.  Headaches.  Tiredness.  Irritability.  Memory problems.  Insomnia.  Choosing to treat or not to treat menopausal changes is an individual decision that you make with your health care provider. What should I know about hormone replacement therapy and supplements? Hormone therapy products are effective for treating symptoms that are associated with menopause, such as hot flashes and night sweats. Hormone replacement carries certain risks, especially as you become older. If you are thinking about using estrogen or estrogen with progestin treatments, discuss the benefits and risks with your health care provider. What should I know about heart disease and stroke? Heart disease, heart attack, and stroke become more likely as you age. This may be due, in part, to the hormonal changes that your body experiences during menopause. These can affect how your body processes dietary fats, triglycerides, and cholesterol. Heart attack and stroke are both medical emergencies. There are many things that you can do to help prevent heart disease  and stroke:  Have your blood pressure checked at least every 1-2 years. High blood pressure causes heart disease and increases the risk of stroke.  If you are 53-22 years old, ask your health care provider if you should take aspirin to prevent a heart attack or a stroke.  Do not use any tobacco products, including cigarettes, chewing tobacco, or electronic cigarettes. If you need help quitting, ask your health care provider.  It is important to eat a healthy diet and maintain a healthy weight. ? Be sure to include plenty of vegetables, fruits, low-fat dairy products, and lean protein. ? Avoid eating foods that are high in solid fats, added sugars, or salt (sodium).  Get regular exercise. This is one of the most important things that you can do for your health. ? Try to exercise for at least 150 minutes each week. The type of exercise that you do should increase your heart rate and make you sweat. This is known as moderate-intensity exercise. ? Try to do strengthening exercises at least twice each week. Do these in addition to the moderate-intensity exercise.  Know your numbers.Ask your health care provider to check your cholesterol and your blood glucose. Continue to have your blood tested as directed by your health care provider.  What should I know about cancer screening? There are several types of cancer. Take the following steps to reduce your risk and to catch any cancer development as early as possible. Breast Cancer  Practice breast self-awareness. ? This means understanding how your breasts normally appear and feel. ? It also means doing regular breast self-exams. Let your health care provider know about any changes, no matter how small.  If you are 40  or older, have a clinician do a breast exam (clinical breast exam or CBE) every year. Depending on your age, family history, and medical history, it may be recommended that you also have a yearly breast X-ray (mammogram).  If you  have a family history of breast cancer, talk with your health care provider about genetic screening.  If you are at high risk for breast cancer, talk with your health care provider about having an MRI and a mammogram every year.  Breast cancer (BRCA) gene test is recommended for women who have family members with BRCA-related cancers. Results of the assessment will determine the need for genetic counseling and BRCA1 and for BRCA2 testing. BRCA-related cancers include these types: ? Breast. This occurs in males or females. ? Ovarian. ? Tubal. This may also be called fallopian tube cancer. ? Cancer of the abdominal or pelvic lining (peritoneal cancer). ? Prostate. ? Pancreatic.  Cervical, Uterine, and Ovarian Cancer Your health care provider may recommend that you be screened regularly for cancer of the pelvic organs. These include your ovaries, uterus, and vagina. This screening involves a pelvic exam, which includes checking for microscopic changes to the surface of your cervix (Pap test).  For women ages 21-65, health care providers may recommend a pelvic exam and a Pap test every three years. For women ages 79-65, they may recommend the Pap test and pelvic exam, combined with testing for human papilloma virus (HPV), every five years. Some types of HPV increase your risk of cervical cancer. Testing for HPV may also be done on women of any age who have unclear Pap test results.  Other health care providers may not recommend any screening for nonpregnant women who are considered low risk for pelvic cancer and have no symptoms. Ask your health care provider if a screening pelvic exam is right for you.  If you have had past treatment for cervical cancer or a condition that could lead to cancer, you need Pap tests and screening for cancer for at least 20 years after your treatment. If Pap tests have been discontinued for you, your risk factors (such as having a new sexual partner) need to be  reassessed to determine if you should start having screenings again. Some women have medical problems that increase the chance of getting cervical cancer. In these cases, your health care provider may recommend that you have screening and Pap tests more often.  If you have a family history of uterine cancer or ovarian cancer, talk with your health care provider about genetic screening.  If you have vaginal bleeding after reaching menopause, tell your health care provider.  There are currently no reliable tests available to screen for ovarian cancer.  Lung Cancer Lung cancer screening is recommended for adults 69-62 years old who are at high risk for lung cancer because of a history of smoking. A yearly low-dose CT scan of the lungs is recommended if you:  Currently smoke.  Have a history of at least 30 pack-years of smoking and you currently smoke or have quit within the past 15 years. A pack-year is smoking an average of one pack of cigarettes per day for one year.  Yearly screening should:  Continue until it has been 15 years since you quit.  Stop if you develop a health problem that would prevent you from having lung cancer treatment.  Colorectal Cancer  This type of cancer can be detected and can often be prevented.  Routine colorectal cancer screening usually begins at  age 42 and continues through age 45.  If you have risk factors for colon cancer, your health care provider may recommend that you be screened at an earlier age.  If you have a family history of colorectal cancer, talk with your health care provider about genetic screening.  Your health care provider may also recommend using home test kits to check for hidden blood in your stool.  A small camera at the end of a tube can be used to examine your colon directly (sigmoidoscopy or colonoscopy). This is done to check for the earliest forms of colorectal cancer.  Direct examination of the colon should be repeated every  5-10 years until age 71. However, if early forms of precancerous polyps or small growths are found or if you have a family history or genetic risk for colorectal cancer, you may need to be screened more often.  Skin Cancer  Check your skin from head to toe regularly.  Monitor any moles. Be sure to tell your health care provider: ? About any new moles or changes in moles, especially if there is a change in a mole's shape or color. ? If you have a mole that is larger than the size of a pencil eraser.  If any of your family members has a history of skin cancer, especially at a young age, talk with your health care provider about genetic screening.  Always use sunscreen. Apply sunscreen liberally and repeatedly throughout the day.  Whenever you are outside, protect yourself by wearing long sleeves, pants, a wide-brimmed hat, and sunglasses.  What should I know about osteoporosis? Osteoporosis is a condition in which bone destruction happens more quickly than new bone creation. After menopause, you may be at an increased risk for osteoporosis. To help prevent osteoporosis or the bone fractures that can happen because of osteoporosis, the following is recommended:  If you are 46-71 years old, get at least 1,000 mg of calcium and at least 600 mg of vitamin D per day.  If you are older than age 55 but younger than age 65, get at least 1,200 mg of calcium and at least 600 mg of vitamin D per day.  If you are older than age 54, get at least 1,200 mg of calcium and at least 800 mg of vitamin D per day.  Smoking and excessive alcohol intake increase the risk of osteoporosis. Eat foods that are rich in calcium and vitamin D, and do weight-bearing exercises several times each week as directed by your health care provider. What should I know about how menopause affects my mental health? Depression may occur at any age, but it is more common as you become older. Common symptoms of depression  include:  Low or sad mood.  Changes in sleep patterns.  Changes in appetite or eating patterns.  Feeling an overall lack of motivation or enjoyment of activities that you previously enjoyed.  Frequent crying spells.  Talk with your health care provider if you think that you are experiencing depression. What should I know about immunizations? It is important that you get and maintain your immunizations. These include:  Tetanus, diphtheria, and pertussis (Tdap) booster vaccine.  Influenza every year before the flu season begins.  Pneumonia vaccine.  Shingles vaccine.  Your health care provider may also recommend other immunizations. This information is not intended to replace advice given to you by your health care provider. Make sure you discuss any questions you have with your health care provider. Document Released: 02/27/2005  Document Revised: 07/26/2015 Document Reviewed: 10/09/2014 Elsevier Interactive Patient Education  2018 [employer] Inc[employer]  

## 2017-06-03 NOTE — Progress Notes (Signed)
Patient: [name], Female    DOB: [birth year], 67 y.o.   MRN: [id number] Visit Date: 06/03/2017  Today's Provider: Margaretann Loveless, PA-C   Chief Complaint  Patient presents with  . Annual Exam   Subjective:    Annual physical exam [name] is a 67 y.o. female who presents today for health maintenance and complete physical. She feels well. She reports exercising some. She reports she is sleeping well. -----------------------------------------------------------------   Review of Systems  Constitutional: Negative.   HENT: Negative.   Eyes: Negative.   Respiratory: Negative.   Cardiovascular: Negative.   Gastrointestinal: Negative.   Endocrine: Negative.   Genitourinary: Negative.        Interstitial cystitis  Musculoskeletal: Negative.   Skin: Negative.   Allergic/Immunologic: Negative.   Neurological: Negative.   Hematological: Negative.   Psychiatric/Behavioral: Negative.     Social History      She  reports that she quit smoking about 21 years ago. She has a 35.00 pack-year smoking history. She has never used smokeless tobacco. She reports that she does not drink alcohol or use drugs.       Social History   Socioeconomic History  . Marital status: Married    Spouse name: Not on file  . Number of children: 2  . Years of education: H/S  . Highest education level: Not on file  Occupational History  . Occupation: Full-time  Social Needs  . Financial resource strain: Not on file  . Food insecurity:    Worry: Not on file    Inability: Not on file  . Transportation needs:    Medical: Not on file    Non-medical: Not on file  Tobacco Use  . Smoking status: Former Smoker    Packs/day: 1.00    Years: 35.00    Pack years: 35.00    Last attempt to quit: 01/20/1996    Years since quitting: 21.3  . Smokeless tobacco: Never Used  Substance and Sexual Activity  . Alcohol use: No  . Drug use: No  . Sexual activity: Yes    Birth control/protection:  Post-menopausal  Lifestyle  . Physical activity:    Days per week: 5 days    Minutes per session: 60 min  . Stress: To some extent  Relationships  . Social connections:    Talks on phone: Not on file    Gets together: Not on file    Attends religious service: Not on file    Active member of club or organization: Not on file    Attends meetings of clubs or organizations: Not on file    Relationship status: Not on file  Other Topics Concern  . Not on file  Social History Narrative  . Not on file    Past Medical History:  Diagnosis Date  . Complication of anesthesia   . GERD (gastroesophageal reflux disease)   . Hyperlipidemia   . Hypertension   . IC (interstitial cystitis)   . PONV (postoperative nausea and vomiting)    NO PROBLEM     4/18 WITH RETINAL SURGERY     Patient Active Problem List   Diagnosis Date Noted  . Age-related osteoporosis without current pathological fracture 06/03/2017  . History of right cataract surgery 09/29/2016  . Chest pain on exertion 05/28/2016  . Right retinal detachment 05/01/2016  . Varicose veins of both lower extremities with inflammation 11/21/2015  . Chronic venous insufficiency 11/21/2015  . Pain in limb  11/21/2015  . GERD (gastroesophageal reflux disease) 11/23/2014  . Allergic rhinitis 07/17/2014  . History of tobacco use 02/26/2006  . Interstitial cystitis 02/22/2006  . Abdominal hernia with gangrene and obstruction 02/22/2006  . Hypertension 04/22/1999    Past Surgical History:  Procedure Laterality Date  . BLADDER SUSPENSION     TRANSVAGINAL SLING  . CATARACT EXTRACTION W/PHACO Right 09/29/2016   Procedure: CATARACT EXTRACTION PHACO AND INTRAOCULAR LENS PLACEMENT (IOC);  Surgeon: Galen Manila, MD;  Location: [facility] ORS;  Service: Ophthalmology;  Laterality: Right;  [place] 00:47 AP% 21.6 CDE 10.26 fluid pack lot # [id number]  . CHOLECYSTECTOMY    . EYE SURGERY    . INSERTION OF MESH  2008   Trans vaginal mesh  . LASER  ABLATION Right 11/21/2015   [facility] Dr. Gilda Crease    Family History        Family Status  Relation Name Status  . Father  Deceased at age 56       liver  . Mother  Deceased at age 77  . Sister  Alive  . Emelda Brothers  (Not Specified)  . Son  Alive  . Son  Alive        Her family history includes Bladder Cancer in her father; Breast cancer in her paternal aunt; Cancer in her father; Congestive Heart Failure in her mother; Coronary artery disease in her father; Healthy in her sister, son, and son; Hypertension in her father and sister; Liver disease in her father.      Allergies  Allergen Reactions  . Doxycycline Nausea Only  . Penicillins Rash  . Sulfa Antibiotics Rash     Current Outpatient Medications:  .  amitriptyline (ELAVIL) 50 MG tablet, Take 1 tablet (50 mg total) by mouth daily., Disp: 90 tablet, Rfl: 1 .  Calcium Carbonate-Vitamin D 600-200 MG-UNIT CAPS, Take 2 tablets by mouth daily., Disp: , Rfl:  .  Calcium Glycerophosphate (PRELIEF PO), Take 2 tablets by mouth daily. As needed , Disp: , Rfl:  .  clobetasol ointment (TEMOVATE) 0.05 %, Apply to affected area every night for 4 weeks, then every other day for 4 weeks and then twice a week for 4 weeks or until resolution., Disp: 30 g, Rfl: 1 .  fluticasone (FLONASE) 50 MCG/ACT nasal spray, Place 2 sprays into both nostrils daily., Disp: 16 g, Rfl: 6 .  mirabegron ER (MYRBETRIQ) 25 MG TB24 tablet, Take by mouth., Disp: , Rfl:  .  pantoprazole (PROTONIX) 40 MG tablet, Take 1 tablet (40 mg total) by mouth daily., Disp: 90 tablet, Rfl: 1 .  triamterene-hydrochlorothiazide (MAXZIDE-25) 37.5-25 MG tablet, Take 1 tablet by mouth daily., Disp: 90 tablet, Rfl: 1 .  estradiol (ESTRACE) 0.1 MG/GM vaginal cream, , Disp: , Rfl:    Patient Care Team: Margaretann Loveless, PA-C as PCP - General (Family Medicine)      Objective:   Vitals: BP 119/78   Wt 129 lb (58.5 kg)   BMI 20.82 kg/m    Vitals:   06/03/17  1010  BP: 119/78  Weight: 129 lb (58.5 kg)     Physical Exam   Depression Screen PHQ 2/9 Scores 06/03/2017 11/30/2016 11/27/2015  PHQ - 2 Score 0 0 0   Functional Status Survey: Is the patient deaf or have difficulty hearing?: No Does the patient have difficulty seeing, even when wearing glasses/contacts?: No Does the patient have difficulty concentrating, remembering, or making decisions?: No Does the patient have difficulty walking or climbing  stairs?: No Does the patient have difficulty dressing or bathing?: No Does the patient have difficulty doing errands alone such as visiting a doctor's office or shopping?: No  Fall Risk  06/03/2017 11/30/2016  Falls in the past year? No No    Hearing Screening             Right ear:   40 Fail 40  40    Left ear:   40 Fail 40  40      Visual Acuity Screening   Right eye Left eye Both eyes  Without correction:     With correction:  Comments: History detached retina right eye   Assessment & Plan:     Routine Health Maintenance and Physical Exam  Exercise Activities and Dietary recommendations Goals    None      Immunization History  Administered Date(s) Administered  . H1N1 12/22/2007  . Influenza Split 11/19/2005, 10/07/2009, 10/10/2010  . Influenza,inj,Quad PF,6+ Mos 10/20/2014  . Influenza-Unspecified 10/15/2015  . Tdap 10/10/2010  . Zoster 02/16/2015    Health Maintenance  Topic Date Due  . DEXA SCAN  12/25/2016  . PNA vac Low Risk Adult (1 of 2 - PCV13) 12/25/2016  . INFLUENZA VACCINE  08/19/2017  . MAMMOGRAM  01/23/2019  . Fecal DNA (Cologuard)  01/27/2020  . TETANUS/TDAP  10/09/2020  . PAP SMEAR  11/30/2021  . Hepatitis C Screening  Completed  . HIV Screening  Discontinued     Discussed health benefits of physical activity, and encouraged her to engage in regular exercise appropriate for her age and condition.    1. Welcome to Medicare  preventive visit EKG today shows NSR rate of 62. Unchanged from 2018. Vaccinations up to date. Will check labs as below and f/u pending results. - EKG 12-Lead - CBC w/Diff/Platelet - Comprehensive Metabolic Panel (CMET)  2. Gastroesophageal reflux disease without esophagitis Stable. Diagnosis pulled for medication refill. Continue current medical treatment plan. - pantoprazole (PROTONIX) 40 MG tablet; Take 1 tablet (40 mg total) by mouth daily.  Dispense: 90 tablet; Refill: 1  3. Essential hypertension Stable. Diagnosis pulled for medication refill. Continue current medical treatment plan. Will check labs as below and f/u pending results. - triamterene-hydrochlorothiazide (MAXZIDE-25) 37.5-25 MG tablet; Take 1 tablet by mouth daily.  Dispense: 90 tablet; Refill: 1 - CBC w/Diff/Platelet - Comprehensive Metabolic Panel (CMET)  4. Interstitial cystitis Stable. Followed by Urology.  - amitriptyline (ELAVIL) 50 MG tablet; Take 1 tablet (50 mg total) by mouth daily.  Dispense: 90 tablet; Refill: 1  5. Age-related osteoporosis without current pathological fracture Last BMD was in 2006 and showed osteoporosis, T score -2.9. Repeat BMD ordered as below.  - DG Bone Density; Future  6. Need for pneumococcal vaccination Prevnar 13 Vaccine given to patient without complications. Patient sat for 15 minutes after administration and was tolerated well without adverse effects. - Pneumococcal conjugate vaccine 13-valent  --------------------------------------------------------------------    Margaretann Loveless, PA-C  [facility]

## 2017-06-04 ENCOUNTER — Telehealth: Payer: Self-pay

## 2017-06-04 LAB — CBC WITH DIFFERENTIAL/PLATELET
BASOS: 0 %
Basophils Absolute: 0 10*3/uL (ref 0.0–0.2)
EOS (ABSOLUTE): 0.1 10*3/uL (ref 0.0–0.4)
EOS: 2 %
HEMATOCRIT: 39.3 % (ref 34.0–46.6)
Hemoglobin: 13.9 g/dL (ref 11.1–15.9)
Immature Grans (Abs): 0 10*3/uL (ref 0.0–0.1)
Immature Granulocytes: 0 %
LYMPHS ABS: 1.6 10*3/uL (ref 0.7–3.1)
Lymphs: 27 %
MCH: 31.3 pg (ref 26.6–33.0)
MCHC: 35.4 g/dL (ref 31.5–35.7)
MCV: 89 fL (ref 79–97)
MONOS ABS: 0.5 10*3/uL (ref 0.1–0.9)
Monocytes: 8 %
Neutrophils Absolute: 3.7 10*3/uL (ref 1.4–7.0)
Neutrophils: 63 %
PLATELETS: 259 10*3/uL (ref 150–379)
RBC: 4.44 x10E6/uL (ref 3.77–5.28)
RDW: 13.4 % (ref 12.3–15.4)
WBC: 5.8 10*3/uL (ref 3.4–10.8)

## 2017-06-04 LAB — COMPREHENSIVE METABOLIC PANEL
A/G RATIO: 1.8 (ref 1.2–2.2)
ALK PHOS: 74 IU/L (ref 39–117)
ALT: 17 IU/L (ref 0–32)
AST: 21 IU/L (ref 0–40)
Albumin: 4.6 g/dL (ref 3.6–4.8)
BUN/Creatinine Ratio: 31 — ABNORMAL HIGH (ref 12–28)
BUN: 20 mg/dL (ref 8–27)
Bilirubin Total: 0.3 mg/dL (ref 0.0–1.2)
CHLORIDE: 92 mmol/L — AB (ref 96–106)
CO2: 25 mmol/L (ref 20–29)
Calcium: 9.4 mg/dL (ref 8.7–10.3)
Creatinine, Ser: 0.64 mg/dL (ref 0.57–1.00)
GFR calc Af Amer: 108 mL/min/{1.73_m2} (ref >59)
GFR calc non Af Amer: 94 mL/min/{1.73_m2} (ref >59)
GLOBULIN, TOTAL: 2.6 g/dL (ref 1.5–4.5)
Glucose: 83 mg/dL (ref 65–99)
POTASSIUM: 4.2 mmol/L (ref 3.5–5.2)
SODIUM: 131 mmol/L — AB (ref 134–144)
Total Protein: 7.2 g/dL (ref 6.0–8.5)

## 2017-06-04 NOTE — Telephone Encounter (Signed)
Viewed by [name] on 06/04/2017 11:50 AM

## 2017-06-04 NOTE — Telephone Encounter (Signed)
-----   Message from Margaretann Loveless, PA-C sent at 06/04/2017 11:38 AM EDT ----- All labs are WNL and stable. Sodium and chloride borderline low so may need to add a little more salt to foods.

## 2017-06-08 DIAGNOSIS — R195 Other fecal abnormalities: Secondary | ICD-10-CM | POA: Diagnosis not present

## 2017-06-08 DIAGNOSIS — K640 First degree hemorrhoids: Secondary | ICD-10-CM | POA: Diagnosis not present

## 2017-06-08 DIAGNOSIS — K648 Other hemorrhoids: Secondary | ICD-10-CM | POA: Diagnosis not present

## 2017-06-08 DIAGNOSIS — K635 Polyp of colon: Secondary | ICD-10-CM | POA: Diagnosis not present

## 2017-06-08 DIAGNOSIS — K621 Rectal polyp: Secondary | ICD-10-CM | POA: Diagnosis not present

## 2017-06-08 DIAGNOSIS — K620 Anal polyp: Secondary | ICD-10-CM | POA: Diagnosis not present

## 2017-06-08 LAB — HM COLONOSCOPY

## 2017-06-10 ENCOUNTER — Encounter: Payer: Self-pay | Admitting: Physician Assistant

## 2017-07-05 ENCOUNTER — Ambulatory Visit (INDEPENDENT_AMBULATORY_CARE_PROVIDER_SITE_OTHER): Payer: Medicare Other | Admitting: Obstetrics and Gynecology

## 2017-07-05 ENCOUNTER — Encounter: Payer: Self-pay | Admitting: Obstetrics and Gynecology

## 2017-07-05 VITALS — BP 132/72 | HR 81 | Ht 66.0 in | Wt 129.0 lb

## 2017-07-05 DIAGNOSIS — R102 Pelvic and perineal pain unspecified side: Secondary | ICD-10-CM

## 2017-07-05 DIAGNOSIS — N898 Other specified noninflammatory disorders of vagina: Secondary | ICD-10-CM

## 2017-07-05 DIAGNOSIS — N904 Leukoplakia of vulva: Secondary | ICD-10-CM | POA: Diagnosis not present

## 2017-07-05 NOTE — Progress Notes (Signed)
Patient ID: [name], female   DOB: 1950-06-11, 67 y.o.   MRN: [id number]  Reason for Consult: Follow-up (lichen sclerosus)   Referred by [name], P*  Subjective:     HPI:  [name] is a 67 y.o. female. Caylei has had mixed results with usage of the clobetasol cream. She has seen some waxing an waning improvement. She reports that it was best around [place] day but then regressed. She has pain still for 7 or more days at a time. The clobetasol has worked better for her than the estrogen.   Past Medical History:  Diagnosis Date  . Complication of anesthesia   . GERD (gastroesophageal reflux disease)   . Hyperlipidemia   . Hypertension   . IC (interstitial cystitis)   . PONV (postoperative nausea and vomiting)    NO PROBLEM     4/18 WITH RETINAL SURGERY   Family History  Problem Relation Age of Onset  . Cancer Father        Bladder  . Bladder Cancer Father   . Hypertension Father   . Coronary artery disease Father        Stent Placed 1990  . Liver disease Father   . Congestive Heart Failure Mother   . Healthy Sister   . Hypertension Sister   . Breast cancer Paternal Aunt   . Healthy Son   . Healthy Son    Past Surgical History:  Procedure Laterality Date  . BLADDER SUSPENSION     TRANSVAGINAL SLING  . CATARACT EXTRACTION W/PHACO Right 09/29/2016   Procedure: CATARACT EXTRACTION PHACO AND INTRAOCULAR LENS PLACEMENT (IOC);  Surgeon: Laura Manila, MD;  Location: [facility] ORS;  Service: Ophthalmology;  Laterality: Right;  [place] 00:47 AP% 21.6 CDE 10.26 fluid pack lot # [id number]  . CHOLECYSTECTOMY    . EYE SURGERY    . INSERTION OF MESH  2008   Trans vaginal mesh  . LASER ABLATION Right 11/21/2015   [facility] Dr. Lesly Moss Social History:  Social History   Tobacco Use  . Smoking status: Former Smoker    Packs/day: 1.00    Years: 35.00    Pack years: 35.00    Last attempt to quit: 01/20/1996    Years since quitting:  21.4  . Smokeless tobacco: Never Used  Substance Use Topics  . Alcohol use: No    Allergies  Allergen Reactions  . Doxycycline Nausea Only  . Penicillins Rash  . Prevnar 13 [Pneumococcal 13-Val Conj Vacc] Rash    Localized swelling and erythema of upper extremity; patient refuses pneumococcal 23 and wants documented she will not take due to this reaction  . Sulfa Antibiotics Rash    Current Outpatient Medications  Medication Sig Dispense Refill  . amitriptyline (ELAVIL) 50 MG tablet Take 1 tablet (50 mg total) by mouth daily. 90 tablet 1  . Calcium Carbonate-Vitamin D 600-200 MG-UNIT CAPS Take 2 tablets by mouth daily.    . Calcium Glycerophosphate (PRELIEF PO) Take 2 tablets by mouth daily. As needed     . clobetasol ointment (TEMOVATE) 0.05 % Apply to affected area every night for 4 weeks, then every other day for 4 weeks and then twice a week for 4 weeks or until resolution. 30 g 1  . fluticasone (FLONASE) 50 MCG/ACT nasal spray Place 2 sprays into both nostrils daily. 16 g 6  . MYRBETRIQ 50 MG TB24 tablet     . pantoprazole (PROTONIX) 40  MG tablet Take 1 tablet (40 mg total) by mouth daily. 90 tablet 1  . triamterene-hydrochlorothiazide (MAXZIDE-25) 37.5-25 MG tablet Take 1 tablet by mouth daily. 90 tablet 1   No current facility-administered medications for this visit.     Review of Systems  Constitutional: Negative for chills, fatigue, fever and unexpected weight change.  HENT: Negative for trouble swallowing.  Eyes: Negative for loss of vision.  Respiratory: Negative for cough, shortness of breath and wheezing.  Cardiovascular: Negative for chest pain, leg swelling, palpitations and syncope.  GI: Negative for abdominal pain, blood in stool, diarrhea, nausea and vomiting.  GU: Negative for difficulty urinating, dysuria, frequency and hematuria.  Musculoskeletal: Negative for back pain, leg pain and joint pain.  Skin: Negative for rash.  Neurological: Negative for  dizziness, headaches, light-headedness, numbness and seizures.  Psychiatric: Negative for behavioral problem, confusion, depressed mood and sleep disturbance.        Objective:  Objective   Vitals:   07/05/17 0813  BP: 132/72  Pulse: 81  Weight: 129 lb (58.5 kg)  Height: 5\' 6"  (1.676 m)   Body mass index is 20.82 kg/m.  Physical Exam  Constitutional: She is oriented to person, place, and time. She appears well-developed and well-nourished.  HENT:  Head: Normocephalic and atraumatic.  Eyes: EOM are normal.  Cardiovascular: Normal rate.  Pulmonary/Chest: Effort normal. No respiratory distress.  Genitourinary:     Neurological: She is alert and oriented to person, place, and time.  Skin: Skin is warm and dry.  Psychiatric: She has a normal mood and affect. Her behavior is normal. Judgment and thought content normal.  Nursing note and vitals reviewed.       Assessment/Plan:     66yo with vulvar pain and irritation. Continue clobetasol for 4-8 more weeks. Discussed potential allergens that may be causing irritation that she can eliminate. If no improvement can do intralesional steroid treatment. Patient will call ahead of her appointment to determine if she wants to have the injection so the medication can be ordered.   Laura Idlerhristanna Nargis Abrams MD [facility], [facility] 07/05/17 10:00 AM

## 2017-07-20 ENCOUNTER — Other Ambulatory Visit: Payer: PRIVATE HEALTH INSURANCE

## 2017-07-20 DIAGNOSIS — H43813 Vitreous degeneration, bilateral: Secondary | ICD-10-CM | POA: Diagnosis not present

## 2017-08-19 ENCOUNTER — Other Ambulatory Visit: Payer: Self-pay | Admitting: Obstetrics and Gynecology

## 2017-08-20 ENCOUNTER — Encounter: Payer: Self-pay | Admitting: Obstetrics and Gynecology

## 2017-08-20 ENCOUNTER — Other Ambulatory Visit: Payer: Self-pay

## 2017-08-23 ENCOUNTER — Telehealth: Payer: Self-pay

## 2017-08-23 NOTE — Telephone Encounter (Signed)
Pt states her Clobetasol cream is not at the pharmacy, please advise

## 2017-08-23 NOTE — Telephone Encounter (Signed)
Please advise 

## 2017-08-24 ENCOUNTER — Ambulatory Visit
Admission: RE | Admit: 2017-08-24 | Discharge: 2017-08-24 | Disposition: A | Discharge status: Home / Self Care | Payer: Medicare Other | Source: Ambulatory Visit | Attending: Physician Assistant | Admitting: Physician Assistant

## 2017-08-24 ENCOUNTER — Other Ambulatory Visit: Payer: Self-pay

## 2017-08-24 ENCOUNTER — Telehealth: Payer: Self-pay

## 2017-08-24 DIAGNOSIS — M810 Age-related osteoporosis without current pathological fracture: Secondary | ICD-10-CM

## 2017-08-24 DIAGNOSIS — M818 Other osteoporosis without current pathological fracture: Secondary | ICD-10-CM | POA: Diagnosis not present

## 2017-08-24 MED ORDER — CLOBETASOL PROPIONATE 0.05 % EX OINT: TOPICAL_OINTMENT | CUTANEOUS | 1 refills | Status: DC

## 2017-08-24 NOTE — Telephone Encounter (Signed)
Noted  

## 2017-08-24 NOTE — Telephone Encounter (Signed)
Pt refill sent in, I saw the pend order from 08/19/17 and it had not been sent so I went ahead and reordered it.

## 2017-08-24 NOTE — Telephone Encounter (Signed)
LMTCB 08/24/2017   Thanks,   -Jaideep Pollack  

## 2017-08-24 NOTE — Telephone Encounter (Signed)
-----   Message from Margaretann LovelessJennifer M Burnette, [place]PA-C sent at 08/24/2017  3:08 PM EDT ----- Bone density shows osteoporosis. If she is interested in treatment please schedule an appt to discuss options. If not she needs to continue her calcium and vit d she is already taking and make sure to do weight bearing exercises such as walking, yoga, etc.

## 2017-08-24 NOTE — Telephone Encounter (Signed)
Pt calling back about rx. Please advise

## 2017-08-24 NOTE — Telephone Encounter (Signed)
Pt advised.  She does not want to start any medication at this time.  She states she will continue calcium w/vitamin d, and she will also increase her exercise.   Thanks,   -Laura RiegerLaura

## 2017-08-30 NOTE — Telephone Encounter (Signed)
Thank you, Dr. Shalee Paolo

## 2017-09-08 ENCOUNTER — Ambulatory Visit: Payer: Medicare Other | Admitting: Obstetrics and Gynecology

## 2017-09-13 ENCOUNTER — Encounter: Payer: Self-pay | Admitting: Obstetrics and Gynecology

## 2017-09-13 ENCOUNTER — Ambulatory Visit (INDEPENDENT_AMBULATORY_CARE_PROVIDER_SITE_OTHER): Payer: Medicare Other | Admitting: Obstetrics and Gynecology

## 2017-09-13 VITALS — BP 118/64 | HR 81 | Ht 65.5 in | Wt 131.0 lb

## 2017-09-13 DIAGNOSIS — N904 Leukoplakia of vulva: Secondary | ICD-10-CM | POA: Diagnosis not present

## 2017-09-13 DIAGNOSIS — N898 Other specified noninflammatory disorders of vagina: Secondary | ICD-10-CM

## 2017-09-13 DIAGNOSIS — R102 Pelvic and perineal pain: Secondary | ICD-10-CM | POA: Diagnosis not present

## 2017-09-13 NOTE — Progress Notes (Signed)
Patient ID: [name], female   DOB: 03/21/1950, 67 y.o.   MRN: [id number]  Reason for Consult: Follow-up (Managable taking meds 3x a week )   Referred by [name], [name], P*  Subjective:     HPI:  [name] is a 67 y.o. female . She is feeling better on the clobetasol ointment. She was off of it for a period of 10 days while her husband was in the hospital and she noticed that it was worse off of the medication. She feels like currently using the medicine 3 times a week keeps her symptoms under control. She has less pain and less itching than before but has not had a complete resolution.   Past Medical History:  Diagnosis Date  . Complication of anesthesia   . GERD (gastroesophageal reflux disease)   . Hyperlipidemia   . Hypertension   . IC (interstitial cystitis)   . PONV (postoperative nausea and vomiting)    NO PROBLEM     4/18 WITH RETINAL SURGERY   Family History  Problem Relation Age of Onset  . Cancer Father        Bladder  . Bladder Cancer Father   . Hypertension Father   . Coronary artery disease Father        Stent Placed 1990  . Liver disease Father   . Congestive Heart Failure Mother   . Healthy Sister   . Hypertension Sister   . Breast cancer Paternal Aunt   . Healthy Son   . Healthy Son    Past Surgical History:  Procedure Laterality Date  . BLADDER SUSPENSION     TRANSVAGINAL SLING  . CATARACT EXTRACTION W/PHACO Right 09/29/2016   Procedure: CATARACT EXTRACTION PHACO AND INTRAOCULAR LENS PLACEMENT (IOC);  Surgeon: Galen ManilaPorfilio, [name], MD;  Location: [facility] ORS;  Service: Ophthalmology;  Laterality: Right;  [place] 00:47 AP% 21.6 CDE 10.26 fluid pack lot # [id number]  . CHOLECYSTECTOMY    . EYE SURGERY    . INSERTION OF MESH  2008   Trans vaginal mesh  . LASER ABLATION Right 11/21/2015   [facility] Dr. Lesly DukesSchnier    Short Social History:  Social History   Tobacco Use  . Smoking status: Former Smoker    Packs/day: 1.00    Years:  35.00    Pack years: 35.00    Last attempt to quit: 01/20/1996    Years since quitting: 21.6  . Smokeless tobacco: Never Used  Substance Use Topics  . Alcohol use: No    Allergies  Allergen Reactions  . Doxycycline Nausea Only  . Penicillins Rash  . Prevnar 13 [Pneumococcal 13-Val Conj Vacc] Rash    Localized swelling and erythema of upper extremity; patient refuses pneumococcal 23 and wants documented she will not take due to this reaction  . Sulfa Antibiotics Rash    Current Outpatient Medications  Medication Sig Dispense Refill  . amitriptyline (ELAVIL) 50 MG tablet Take 1 tablet (50 mg total) by mouth daily. 90 tablet 1  . Calcium Carbonate-Vitamin D 600-200 MG-UNIT CAPS Take 2 tablets by mouth daily.    . Calcium Glycerophosphate (PRELIEF PO) Take 2 tablets by mouth daily. As needed     . clobetasol ointment (TEMOVATE) 0.05 % Apply to affected area every night for 4 weeks, then every other day for 4 weeks and then twice a week for 4 weeks or until resolution. 30 g 0  . clobetasol ointment (TEMOVATE) 0.05 % Apply to affected area  every night for 4 weeks, then every other day for 4 weeks and then twice a week for 4 weeks or until resolution. 30 g 1  . fluticasone (FLONASE) 50 MCG/ACT nasal spray Place 2 sprays into both nostrils daily. 16 g 6  . MYRBETRIQ 50 MG TB24 tablet     . pantoprazole (PROTONIX) 40 MG tablet Take 1 tablet (40 mg total) by mouth daily. 90 tablet 1  . triamterene-hydrochlorothiazide (MAXZIDE-25) 37.5-25 MG tablet Take 1 tablet by mouth daily. 90 tablet 1   No current facility-administered medications for this visit.     Review of Systems  Constitutional: Negative for chills, fatigue, fever and unexpected weight change.  HENT: Negative for trouble swallowing.  Eyes: Negative for loss of vision.  Respiratory: Negative for cough, shortness of breath and wheezing.  Cardiovascular: Negative for chest pain, leg swelling, palpitations and syncope.  GI: Negative  for abdominal pain, blood in stool, diarrhea, nausea and vomiting.  GU: Negative for difficulty urinating, dysuria, frequency and hematuria.  Musculoskeletal: Negative for back pain, leg pain and joint pain.  Skin: Negative for rash.  Neurological: Negative for dizziness, headaches, light-headedness, numbness and seizures.  Psychiatric: Negative for behavioral problem, confusion, depressed mood and sleep disturbance.        Objective:  Objective   Vitals:   09/13/17 0811  BP: 118/64  Pulse: 81  Weight: 131 lb (59.4 kg)  Height: 5' 5.5" (1.664 Moss)   Body mass index is 21.47 kg/Moss.  Physical Exam  Constitutional: She is oriented to person, place, and time. She appears well-developed and well-nourished.  HENT:  Head: Normocephalic and atraumatic.  Eyes: Pupils are equal, round, and reactive to light. EOM are normal.  Cardiovascular: Normal rate and regular rhythm.  Pulmonary/Chest: Effort normal. No respiratory distress.  Neurological: She is alert and oriented to person, place, and time.  Skin: Skin is warm and dry.  Psychiatric: She has a normal mood and affect. Her behavior is normal. Judgment and thought content normal.  Nursing note and vitals reviewed.        Assessment/Plan:     67 yo with lichen sclerosis-  Will continue with treatment of perineum with clobetasol application 3 times a week. Will return in 12 weeks for an exam at that time. Will consider injection therapy with steroids, will call ahead so medication can be ordered if she desires this therapy.    More than 15 minutes were spent face to face with the patient in the room with more than 50% of the time spent providing counseling and discussing the plan of management. We discussed how she has been feeling with the treatment and plans for treatment moving forward.   Adelene Idler MD [facility], [facility] 09/13/17 8:39 AM

## 2017-09-27 DIAGNOSIS — M2012 Hallux valgus (acquired), left foot: Secondary | ICD-10-CM | POA: Diagnosis not present

## 2017-09-27 DIAGNOSIS — M2011 Hallux valgus (acquired), right foot: Secondary | ICD-10-CM | POA: Diagnosis not present

## 2017-09-27 DIAGNOSIS — M79672 Pain in left foot: Secondary | ICD-10-CM | POA: Diagnosis not present

## 2017-09-30 DIAGNOSIS — M79672 Pain in left foot: Secondary | ICD-10-CM | POA: Diagnosis not present

## 2017-09-30 DIAGNOSIS — M2011 Hallux valgus (acquired), right foot: Secondary | ICD-10-CM | POA: Diagnosis not present

## 2017-09-30 DIAGNOSIS — M2031 Hallux varus (acquired), right foot: Secondary | ICD-10-CM | POA: Diagnosis not present

## 2017-09-30 DIAGNOSIS — Z01818 Encounter for other preprocedural examination: Secondary | ICD-10-CM | POA: Diagnosis not present

## 2017-09-30 DIAGNOSIS — M2012 Hallux valgus (acquired), left foot: Secondary | ICD-10-CM | POA: Diagnosis not present

## 2017-09-30 DIAGNOSIS — M2032 Hallux varus (acquired), left foot: Secondary | ICD-10-CM | POA: Diagnosis not present

## 2017-10-11 DIAGNOSIS — M2012 Hallux valgus (acquired), left foot: Secondary | ICD-10-CM | POA: Diagnosis not present

## 2017-10-11 DIAGNOSIS — S93145A Subluxation of metatarsophalangeal joint of left lesser toe(s), initial encounter: Secondary | ICD-10-CM | POA: Diagnosis not present

## 2017-10-11 DIAGNOSIS — M2011 Hallux valgus (acquired), right foot: Secondary | ICD-10-CM | POA: Diagnosis not present

## 2017-10-18 DIAGNOSIS — Z23 Encounter for immunization: Secondary | ICD-10-CM | POA: Diagnosis not present

## 2017-11-09 DIAGNOSIS — S93145D Subluxation of metatarsophalangeal joint of left lesser toe(s), subsequent encounter: Secondary | ICD-10-CM | POA: Diagnosis not present

## 2017-11-09 DIAGNOSIS — M2012 Hallux valgus (acquired), left foot: Secondary | ICD-10-CM | POA: Diagnosis not present

## 2017-12-02 DIAGNOSIS — M2012 Hallux valgus (acquired), left foot: Secondary | ICD-10-CM | POA: Diagnosis not present

## 2017-12-02 DIAGNOSIS — M216X2 Other acquired deformities of left foot: Secondary | ICD-10-CM | POA: Diagnosis not present

## 2017-12-02 DIAGNOSIS — M66872 Spontaneous rupture of other tendons, left ankle and foot: Secondary | ICD-10-CM | POA: Diagnosis not present

## 2017-12-02 HISTORY — PX: BUNIONECTOMY: SHX129

## 2017-12-03 DIAGNOSIS — M2012 Hallux valgus (acquired), left foot: Secondary | ICD-10-CM | POA: Diagnosis not present

## 2017-12-03 DIAGNOSIS — S93145D Subluxation of metatarsophalangeal joint of left lesser toe(s), subsequent encounter: Secondary | ICD-10-CM | POA: Diagnosis not present

## 2017-12-05 ENCOUNTER — Other Ambulatory Visit: Payer: Self-pay | Admitting: Physician Assistant

## 2017-12-05 DIAGNOSIS — K219 Gastro-esophageal reflux disease without esophagitis: Secondary | ICD-10-CM

## 2017-12-05 DIAGNOSIS — N3010 Interstitial cystitis (chronic) without hematuria: Secondary | ICD-10-CM

## 2017-12-05 DIAGNOSIS — I10 Essential (primary) hypertension: Secondary | ICD-10-CM

## 2017-12-10 DIAGNOSIS — S93145D Subluxation of metatarsophalangeal joint of left lesser toe(s), subsequent encounter: Secondary | ICD-10-CM | POA: Diagnosis not present

## 2017-12-10 DIAGNOSIS — M2012 Hallux valgus (acquired), left foot: Secondary | ICD-10-CM | POA: Diagnosis not present

## 2017-12-14 ENCOUNTER — Ambulatory Visit: Payer: Medicare Other | Admitting: Obstetrics and Gynecology

## 2017-12-14 DIAGNOSIS — S93145D Subluxation of metatarsophalangeal joint of left lesser toe(s), subsequent encounter: Secondary | ICD-10-CM | POA: Diagnosis not present

## 2017-12-14 DIAGNOSIS — M2012 Hallux valgus (acquired), left foot: Secondary | ICD-10-CM | POA: Diagnosis not present

## 2017-12-28 DIAGNOSIS — M2012 Hallux valgus (acquired), left foot: Secondary | ICD-10-CM | POA: Diagnosis not present

## 2017-12-28 DIAGNOSIS — S93145D Subluxation of metatarsophalangeal joint of left lesser toe(s), subsequent encounter: Secondary | ICD-10-CM | POA: Diagnosis not present

## 2017-12-30 ENCOUNTER — Ambulatory Visit (INDEPENDENT_AMBULATORY_CARE_PROVIDER_SITE_OTHER): Payer: Medicare Other | Admitting: Obstetrics and Gynecology

## 2017-12-30 ENCOUNTER — Encounter: Payer: Self-pay | Admitting: Obstetrics and Gynecology

## 2017-12-30 VITALS — BP 152/90 | Ht 66.0 in | Wt 137.0 lb

## 2017-12-30 DIAGNOSIS — N898 Other specified noninflammatory disorders of vagina: Secondary | ICD-10-CM | POA: Diagnosis not present

## 2017-12-30 DIAGNOSIS — N904 Leukoplakia of vulva: Secondary | ICD-10-CM

## 2017-12-30 DIAGNOSIS — R102 Pelvic and perineal pain: Secondary | ICD-10-CM | POA: Diagnosis not present

## 2017-12-30 NOTE — Progress Notes (Signed)
Patient ID: [name], female   DOB: 08/24/1950, 67 y.o.   MRN: [id number]  Reason for Consult: Follow-up (Nothings changed)   Referred by Margaretann LovelessBurnette, Jennifer M, P*  Subjective:     HPI:  [name] is a 67 y.o. female  She is stable on her current dosage of clobetasol 3 times a week.  Feeling happy with continued 3 x week clobetasol ointment.  Only having pain with prolonged sitting. Recent foot surgery.Recovering.   Past Medical History:  Diagnosis Date  . Complication of anesthesia   . GERD (gastroesophageal reflux disease)   . Hyperlipidemia   . Hypertension   . IC (interstitial cystitis)   . PONV (postoperative nausea and vomiting)    NO PROBLEM     4/18 WITH RETINAL SURGERY   Family History  Problem Relation Age of Onset  . Cancer Father        Bladder  . Bladder Cancer Father   . Hypertension Father   . Coronary artery disease Father        Stent Placed 1990  . Liver disease Father   . Congestive Heart Failure Mother   . Healthy Sister   . Hypertension Sister   . Breast cancer Paternal Aunt   . Healthy Son   . Healthy Son    Past Surgical History:  Procedure Laterality Date  . BLADDER SUSPENSION     TRANSVAGINAL SLING  . BUNIONECTOMY  12/02/2017  . CATARACT EXTRACTION W/PHACO Right 09/29/2016   Procedure: CATARACT EXTRACTION PHACO AND INTRAOCULAR LENS PLACEMENT (IOC);  Surgeon: Galen ManilaPorfilio, [name], MD;  Location: [facility] ORS;  Service: Ophthalmology;  Laterality: Right;  US 00:47 AP% 21.6 CDE 10.26 fluid pack lot # [id number]  . CHOLECYSTECTOMY    . EYE SURGERY    . INSERTION OF MESH  2008   Trans vaginal mesh  . LASER ABLATION Right 11/21/2015   [facility] Dr. Lesly DukesSchnier    Short Social History:  Social History   Tobacco Use  . Smoking status: Former Smoker    Packs/day: 1.00    Years: 35.00    Pack years: 35.00    Last attempt to quit: 01/20/1996    Years since quitting: 22.0  . Smokeless tobacco: Never Used  Substance Use  Topics  . Alcohol use: No    Allergies  Allergen Reactions  . Doxycycline Nausea Only  . Penicillins Rash  . Pneumococcal 13-Val Conj Vacc Rash    Localized swelling and erythema of upper extremity; patient refuses pneumococcal 23 and wants documented she will not take due to this reaction Localized swelling and erythema of upper extremity; patient refuses pneumococcal 23 and wants documented she will not take due to this reaction  . Sulfa Antibiotics Rash    Current Outpatient Medications  Medication Sig Dispense Refill  . amitriptyline (ELAVIL) 50 MG tablet Take 1 tablet (50 mg total) by mouth daily. 90 tablet 1  . aspirin EC 325 MG tablet Take by mouth.    . Calcium Carbonate-Vitamin D 600-200 MG-UNIT CAPS Take 2 tablets by mouth daily.    . Calcium Glycerophosphate (PRELIEF PO) Take 2 tablets by mouth daily. As needed     . Cholecalciferol (VITAMIN D3) 125 MCG (5000 UT) TABS Take by mouth.    . clobetasol ointment (TEMOVATE) 0.05 % Apply to affected area every night for 4 weeks, then every other day for 4 weeks and then twice a week for 4 weeks or until resolution. 30 g  0  . fluticasone (FLONASE) 50 MCG/ACT nasal spray Place 2 sprays into both nostrils daily. 16 g 6  . MYRBETRIQ 50 MG TB24 tablet     . pantoprazole (PROTONIX) 40 MG tablet Take 1 tablet (40 mg total) by mouth daily. 90 tablet 1  . triamterene-hydrochlorothiazide (MAXZIDE-25) 37.5-25 MG tablet Take 1 tablet by mouth daily. 90 tablet 1   No current facility-administered medications for this visit.     Review of Systems  Constitutional: Negative for chills, fatigue, fever and unexpected weight change.  HENT: Negative for trouble swallowing.  Eyes: Negative for loss of vision.  Respiratory: Negative for cough, shortness of breath and wheezing.  Cardiovascular: Negative for chest pain, leg swelling, palpitations and syncope.  GI: Negative for abdominal pain, blood in stool, diarrhea, nausea and vomiting.  GU:  Negative for difficulty urinating, dysuria, frequency and hematuria.  Musculoskeletal: Negative for back pain, leg pain and joint pain.  Skin: Negative for rash.  Neurological: Negative for dizziness, headaches, light-headedness, numbness and seizures.  Psychiatric: Negative for behavioral problem, confusion, depressed mood and sleep disturbance.        Objective:  Objective   Vitals:   12/30/17 1005  BP: (!) 152/90  Weight: 137 lb (62.1 kg)  Height: 5\' 6"  (1.676 m)   Body mass index is 22.11 kg/m.  Physical Exam Genitourinary:           Assessment/Plan:     67 yo with lichen sclerosis- stable on current clobetasol treatment. Discussed could do a steroid injection of the lesions if desired or needed in the future. Much improvement on examination. Small redness by the rectum. Continue current therapy.  More than 15 minutes were spent face to face with the patient in the room with more than 50% of the time spent providing counseling and discussing the plan of management.    Adelene Idler MD [facility], [facility] 01/17/2018 12:45 PM

## 2017-12-31 ENCOUNTER — Other Ambulatory Visit: Payer: Self-pay | Admitting: Physician Assistant

## 2017-12-31 DIAGNOSIS — Z1231 Encounter for screening mammogram for malignant neoplasm of breast: Secondary | ICD-10-CM

## 2018-01-07 DIAGNOSIS — M2012 Hallux valgus (acquired), left foot: Secondary | ICD-10-CM | POA: Diagnosis not present

## 2018-01-07 DIAGNOSIS — S93145D Subluxation of metatarsophalangeal joint of left lesser toe(s), subsequent encounter: Secondary | ICD-10-CM | POA: Diagnosis not present

## 2018-01-17 ENCOUNTER — Encounter: Payer: Self-pay | Admitting: Obstetrics and Gynecology

## 2018-01-18 DIAGNOSIS — M2012 Hallux valgus (acquired), left foot: Secondary | ICD-10-CM | POA: Diagnosis not present

## 2018-01-28 DIAGNOSIS — M2560 Stiffness of unspecified joint, not elsewhere classified: Secondary | ICD-10-CM | POA: Diagnosis not present

## 2018-01-28 DIAGNOSIS — M79672 Pain in left foot: Secondary | ICD-10-CM | POA: Diagnosis not present

## 2018-01-28 DIAGNOSIS — M6281 Muscle weakness (generalized): Secondary | ICD-10-CM | POA: Diagnosis not present

## 2018-02-01 DIAGNOSIS — M79672 Pain in left foot: Secondary | ICD-10-CM | POA: Diagnosis not present

## 2018-02-04 DIAGNOSIS — M79672 Pain in left foot: Secondary | ICD-10-CM | POA: Diagnosis not present

## 2018-02-07 ENCOUNTER — Ambulatory Visit
Admission: RE | Admit: 2018-02-07 | Discharge: 2018-02-07 | Disposition: A | Discharge status: Home / Self Care | Payer: Medicare Other | Source: Ambulatory Visit | Attending: Physician Assistant | Admitting: Physician Assistant

## 2018-02-07 ENCOUNTER — Telehealth: Payer: Self-pay

## 2018-02-07 DIAGNOSIS — Z1231 Encounter for screening mammogram for malignant neoplasm of breast: Secondary | ICD-10-CM | POA: Insufficient documentation

## 2018-02-07 NOTE — Telephone Encounter (Signed)
-----   Message from Margaretann Loveless, [place] sent at 02/07/2018  8:35 AM EST ----- Normal mammogram. Repeat screening in one year.

## 2018-02-07 NOTE — Telephone Encounter (Signed)
Patient advised as below.  

## 2018-02-08 DIAGNOSIS — M79672 Pain in left foot: Secondary | ICD-10-CM | POA: Diagnosis not present

## 2018-02-11 DIAGNOSIS — M79672 Pain in left foot: Secondary | ICD-10-CM | POA: Diagnosis not present

## 2018-02-15 DIAGNOSIS — M79672 Pain in left foot: Secondary | ICD-10-CM | POA: Diagnosis not present

## 2018-02-18 DIAGNOSIS — M79672 Pain in left foot: Secondary | ICD-10-CM | POA: Diagnosis not present

## 2018-02-21 DIAGNOSIS — M79672 Pain in left foot: Secondary | ICD-10-CM | POA: Diagnosis not present

## 2018-02-25 DIAGNOSIS — M79672 Pain in left foot: Secondary | ICD-10-CM | POA: Diagnosis not present

## 2018-03-01 DIAGNOSIS — M79672 Pain in left foot: Secondary | ICD-10-CM | POA: Diagnosis not present

## 2018-03-04 DIAGNOSIS — M79672 Pain in left foot: Secondary | ICD-10-CM | POA: Diagnosis not present

## 2018-03-07 DIAGNOSIS — M79672 Pain in left foot: Secondary | ICD-10-CM | POA: Diagnosis not present

## 2018-03-14 DIAGNOSIS — M79672 Pain in left foot: Secondary | ICD-10-CM | POA: Diagnosis not present

## 2018-03-18 DIAGNOSIS — X58XXXD Exposure to other specified factors, subsequent encounter: Secondary | ICD-10-CM | POA: Diagnosis not present

## 2018-03-18 DIAGNOSIS — S93145D Subluxation of metatarsophalangeal joint of left lesser toe(s), subsequent encounter: Secondary | ICD-10-CM | POA: Diagnosis not present

## 2018-03-18 DIAGNOSIS — M2012 Hallux valgus (acquired), left foot: Secondary | ICD-10-CM | POA: Diagnosis not present

## 2018-04-14 DIAGNOSIS — R102 Pelvic and perineal pain: Secondary | ICD-10-CM | POA: Diagnosis not present

## 2018-04-14 DIAGNOSIS — R3915 Urgency of urination: Secondary | ICD-10-CM | POA: Diagnosis not present

## 2018-06-06 ENCOUNTER — Encounter: Payer: Medicare Other | Admitting: Physician Assistant

## 2018-08-10 ENCOUNTER — Other Ambulatory Visit: Payer: Self-pay

## 2018-08-10 ENCOUNTER — Ambulatory Visit (INDEPENDENT_AMBULATORY_CARE_PROVIDER_SITE_OTHER): Payer: Medicare Other | Admitting: Physician Assistant

## 2018-08-10 ENCOUNTER — Encounter: Payer: Self-pay | Admitting: Physician Assistant

## 2018-08-10 VITALS — BP 132/84 | HR 68 | Temp 98.1°F | Ht 66.0 in | Wt 134.0 lb

## 2018-08-10 DIAGNOSIS — E7800 Pure hypercholesterolemia, unspecified: Secondary | ICD-10-CM

## 2018-08-10 DIAGNOSIS — N3010 Interstitial cystitis (chronic) without hematuria: Secondary | ICD-10-CM

## 2018-08-10 DIAGNOSIS — I10 Essential (primary) hypertension: Secondary | ICD-10-CM

## 2018-08-10 DIAGNOSIS — E785 Hyperlipidemia, unspecified: Secondary | ICD-10-CM | POA: Insufficient documentation

## 2018-08-10 DIAGNOSIS — K219 Gastro-esophageal reflux disease without esophagitis: Secondary | ICD-10-CM

## 2018-08-10 DIAGNOSIS — Z0000 Encounter for general adult medical examination without abnormal findings: Secondary | ICD-10-CM

## 2018-08-10 MED ORDER — PANTOPRAZOLE SODIUM 40 MG PO TBEC: 40.0000 mg | DELAYED_RELEASE_TABLET | Freq: Every day | ORAL | 1 refills | Status: DC

## 2018-08-10 MED ORDER — TRIAMTERENE-HCTZ 37.5-25 MG PO TABS: 1.0000 | ORAL_TABLET | Freq: Every day | ORAL | 1 refills | Status: DC

## 2018-08-10 MED ORDER — AMITRIPTYLINE HCL 50 MG PO TABS: 50.0000 mg | ORAL_TABLET | Freq: Every day | ORAL | 1 refills | Status: DC

## 2018-08-10 NOTE — Patient Instructions (Signed)
Health Maintenance After Age 67 After age 67, you are at a higher risk for certain long-term diseases and infections as well as injuries from falls. Falls are a major cause of broken bones and head injuries in people who are older than age 67. Getting regular preventive care can help to keep you healthy and well. Preventive care includes getting regular testing and making lifestyle changes as recommended by your health care provider. Talk with your health care provider about:  Which screenings and tests you should have. A screening is a test that checks for a disease when you have no symptoms.  A diet and exercise plan that is right for you. What should I know about screenings and tests to prevent falls? Screening and testing are the best ways to find a health problem early. Early diagnosis and treatment give you the best chance of managing medical conditions that are common after age 67. Certain conditions and lifestyle choices may make you more likely to have a fall. Your health care provider may recommend:  Regular vision checks. Poor vision and conditions such as cataracts can make you more likely to have a fall. If you wear glasses, make sure to get your prescription updated if your vision changes.  Medicine review. Work with your health care provider to regularly review all of the medicines you are taking, including over-the-counter medicines. Ask your health care provider about any side effects that may make you more likely to have a fall. Tell your health care provider if any medicines that you take make you feel dizzy or sleepy.  Osteoporosis screening. Osteoporosis is a condition that causes the bones to get weaker. This can make the bones weak and cause them to break more easily.  Blood pressure screening. Blood pressure changes and medicines to control blood pressure can make you feel dizzy.  Strength and balance checks. Your health care provider may recommend certain tests to check your  strength and balance while standing, walking, or changing positions.  Foot health exam. Foot pain and numbness, as well as not wearing proper footwear, can make you more likely to have a fall.  Depression screening. You may be more likely to have a fall if you have a fear of falling, feel emotionally low, or feel unable to do activities that you used to do.  Alcohol use screening. Using too much alcohol can affect your balance and may make you more likely to have a fall. What actions can I take to lower my risk of falls? General instructions  Talk with your health care provider about your risks for falling. Tell your health care provider if: ? You fall. Be sure to tell your health care provider about all falls, even ones that seem minor. ? You feel dizzy, sleepy, or off-balance.  Take over-the-counter and prescription medicines only as told by your health care provider. These include any supplements.  Eat a healthy diet and maintain a healthy weight. A healthy diet includes low-fat dairy products, low-fat (lean) meats, and fiber from whole grains, beans, and lots of fruits and vegetables. Home safety  Remove any tripping hazards, such as rugs, cords, and clutter.  Install safety equipment such as grab bars in bathrooms and safety rails on stairs.  Keep rooms and walkways well-lit. Activity   Follow a regular exercise program to stay fit. This will help you maintain your balance. Ask your health care provider what types of exercise are appropriate for you.  If you need a cane or   walker, use it as recommended by your health care provider.  Wear supportive shoes that have nonskid soles. Lifestyle  Do not drink alcohol if your health care provider tells you not to drink.  If you drink alcohol, limit how much you have: ? 0-1 drink a day for women. ? 0-2 drinks a day for men.  Be aware of how much alcohol is in your drink. In the [place] one drink equals one typical bottle of beer (12  oz), one-half glass of wine (5 oz), or one shot of hard liquor (1 oz).  Do not use any products that contain nicotine or tobacco, such as cigarettes and e-cigarettes. If you need help quitting, ask your health care provider. Summary  Having a healthy lifestyle and getting preventive care can help to protect your health and wellness after age 67.  Screening and testing are the best way to find a health problem early and help you avoid having a fall. Early diagnosis and treatment give you the best chance for managing medical conditions that are more common for people who are older than age 67.  Falls are a major cause of broken bones and head injuries in people who are older than age 67. Take precautions to prevent a fall at home.  Work with your health care provider to learn what changes you can make to improve your health and wellness and to prevent falls. This information is not intended to replace advice given to you by your health care provider. Make sure you discuss any questions you have with your health care provider. Document Released: 11/18/2016 Document Revised: 04/28/2018 Document Reviewed: 11/18/2016 Elsevier Patient Education  2020 Elsevier Inc.  

## 2018-08-10 NOTE — Progress Notes (Signed)
Laura LitesDiane M Moss  MRN: [id number] DOB: 09/25/1950  Subjective:  HPI   The patient is a 68 year old female who presents for her annual Medicare wellness exam.  01/26/17 Cologaurd-positive, follow up Colonoscopy with Dr Candida PeelingElliott-normal 02/07/18 Mammogram-normal 11/30/16 Pap and HPV-normal and negative 08/24/17 BMD-Osteoporosis  Immunization History  Administered Date(s) Administered  . H1N1 12/22/2007  . Influenza Split 11/19/2005, 10/07/2009, 10/10/2010  . Influenza,inj,Quad PF,6+ Mos 10/20/2014  . Influenza-Unspecified 10/15/2015, 10/18/2017  . Pneumococcal Conjugate-13 06/03/2017  . Tdap 10/10/2010  . Zoster 02/16/2015    Patient Active Problem List   Diagnosis Date Noted  . Age-related osteoporosis without current pathological fracture 06/03/2017  . History of right cataract surgery 09/29/2016  . Chest pain on exertion 05/28/2016  . Right retinal detachment 05/01/2016  . Varicose veins of both lower extremities with inflammation 11/21/2015  . Chronic venous insufficiency 11/21/2015  . Pain in limb 11/21/2015  . GERD (gastroesophageal reflux disease) 11/23/2014  . Allergic rhinitis 07/17/2014  . History of tobacco use 02/26/2006  . Interstitial cystitis 02/22/2006  . Abdominal hernia with gangrene and obstruction 02/22/2006  . Hypertension 04/22/1999    Past Medical History:  Diagnosis Date  . Complication of anesthesia   . GERD (gastroesophageal reflux disease)   . Hyperlipidemia   . Hypertension   . IC (interstitial cystitis)   . PONV (postoperative nausea and vomiting)    NO PROBLEM     4/18 WITH RETINAL SURGERY    Social History   Socioeconomic History  . Marital status: Married    Spouse name: Not on file  . Number of children: 2  . Years of education: H/S  . Highest education level: Not on file  Occupational History  . Occupation: Full-time  Social Needs  . Financial resource strain: Not on file  . Food insecurity    Worry: Not on file    Inability:  Not on file  . Transportation needs    Medical: Not on file    Non-medical: Not on file  Tobacco Use  . Smoking status: Former Smoker    Packs/day: 1.00    Years: 35.00    Pack years: 35.00    Quit date: 01/20/1996    Years since quitting: 22.5  . Smokeless tobacco: Never Used  Substance and Sexual Activity  . Alcohol use: No  . Drug use: No  . Sexual activity: Yes    Birth control/protection: Post-menopausal  Lifestyle  . Physical activity    Days per week: 5 days    Minutes per session: 60 min  . Stress: To some extent  Relationships  . Social Musicianconnections    Talks on phone: Not on file    Gets together: Not on file    Attends religious service: Not on file    Active member of club or organization: Not on file    Attends meetings of clubs or organizations: Not on file    Relationship status: Not on file  . Intimate partner violence    Fear of current or ex partner: Not on file    Emotionally abused: Not on file    Physically abused: Not on file    Forced sexual activity: Not on file  Other Topics Concern  . Not on file  Social History Narrative  . Not on file    Outpatient Encounter Medications as of 08/10/2018  Medication Sig Note  . amitriptyline (ELAVIL) 50 MG tablet Take 1 tablet (50 mg total) by mouth daily.   .Marland Kitchen  Calcium Carbonate-Vitamin D 600-200 MG-UNIT CAPS Take 2 tablets by mouth daily. 11/23/2014: DX: 595.1 Received from: [facility]: Take by mouth.  . Calcium Glycerophosphate (PRELIEF PO) Take 2 tablets by mouth daily. As needed    . clobetasol ointment (TEMOVATE) 0.05 % Apply to affected area every night for 4 weeks, then every other day for 4 weeks and then twice a week for 4 weeks or until resolution.   . fluticasone (FLONASE) 50 MCG/ACT nasal spray Place 2 sprays into both nostrils daily.   Marland Kitchen MYRBETRIQ 50 MG TB24 tablet    . pantoprazole (PROTONIX) 40 MG tablet Take 1 tablet (40 mg total) by mouth daily.   Marland Kitchen  triamterene-hydrochlorothiazide (MAXZIDE-25) 37.5-25 MG tablet Take 1 tablet by mouth daily.   . vitamin C (ASCORBIC ACID) 500 MG tablet Take 500 mg by mouth daily.   Marland Kitchen aspirin EC 325 MG tablet Take by mouth.    No facility-administered encounter medications on file as of 08/10/2018.     Allergies  Allergen Reactions  . Doxycycline Nausea Only  . Penicillins Rash  . Pneumococcal 13-Val Conj Vacc Rash    Localized swelling and erythema of upper extremity; patient refuses pneumococcal 23 and wants documented she will not take due to this reaction Localized swelling and erythema of upper extremity; patient refuses pneumococcal 23 and wants documented she will not take due to this reaction  . Sulfa Antibiotics Rash    Review of Systems  Constitutional: Negative.   HENT: Negative.   Eyes: Negative.   Respiratory: Negative.   Cardiovascular: Negative.   Gastrointestinal: Negative.   Genitourinary: Negative.   Musculoskeletal: Negative.   Skin: Negative.   Neurological: Negative.   Endo/Heme/Allergies: Negative.   Psychiatric/Behavioral: Negative.     Objective:  BP 132/84 (BP Location: Right Arm, Patient Position: Sitting, Cuff Size: Normal)   Pulse 68   Temp 98.1 F (36.7 C) (Oral)   Ht 5\' 6"  (1.676 m)   Wt 134 lb (60.8 kg)   SpO2 99%   BMI 21.63 kg/m   Fall Risk  08/10/2018 06/03/2017 11/30/2016  Falls in the past year? 0 No No   Depression screen [facility] 2/9 08/10/2018 06/03/2017 11/30/2016 11/27/2015  Decreased Interest 0 0 0 0  Down, Depressed, Hopeless 0 0 0 0  PHQ - 2 Score 0 0 0 0   Functional Status Survey: Is the patient deaf or have difficulty hearing?: No Does the patient have difficulty seeing, even when wearing glasses/contacts?: No Does the patient have difficulty concentrating, remembering, or making decisions?: No Does the patient have difficulty walking or climbing stairs?: No Does the patient have difficulty dressing or bathing?: No Does the patient have  difficulty doing errands alone such as visiting a doctor's office or shopping?: No  Current Exercise Habits: Home exercise routine, Type of exercise: walking, Time (Minutes): 30, Frequency (Times/Week): 5, Weekly Exercise (Minutes/Week): 150     Office Visit from 08/10/2018 in [place]  AUDIT-C Score  0     Physical Exam  Constitutional: She is oriented to person, place, and time and well-developed, well-nourished, and in no distress. No distress.  HENT:  Head: Normocephalic and atraumatic.  Right Ear: External ear normal.  Left Ear: External ear normal.  Nose: Nose normal.  Mouth/Throat: Oropharynx is clear and moist. No oropharyngeal exudate.  Eyes: Pupils are equal, round, and reactive to light. Conjunctivae and EOM are normal. Right eye exhibits no discharge. Left eye exhibits no discharge. No scleral  icterus.  Neck: Normal range of motion. Neck supple. No JVD present. No tracheal deviation present. No thyromegaly present.  Cardiovascular: Normal rate, regular rhythm, normal heart sounds and intact distal pulses. Exam reveals no gallop and no friction rub.  No murmur heard. Pulmonary/Chest: Effort normal and breath sounds normal. No respiratory distress. She has no wheezes.  Abdominal: Soft. Bowel sounds are normal. She exhibits no distension. There is no abdominal tenderness.  Musculoskeletal: Normal range of motion.        General: No edema.  Lymphadenopathy:    She has no cervical adenopathy.  Neurological: She is alert and oriented to person, place, and time. No cranial nerve deficit. Gait normal. Coordination normal.  Skin: Skin is warm and dry. She is not diaphoretic.  Psychiatric: Mood, memory, affect and judgment normal.  Vitals reviewed.   Assessment and Plan :   1. Medicare annual wellness visit, subsequent Normal exam. Patient declines pneumococcal 23 vaccine. Had reaction to Prevnar 13. Up to date on screenings.  - CBC w/Diff/Platelet -  Comprehensive Metabolic Panel (CMET) - Lipid Profile  2. Essential hypertension Stable. Diagnosis pulled for medication refill. Continue current medical treatment plan. Will check labs as below and f/u pending results. - CBC w/Diff/Platelet - Comprehensive Metabolic Panel (CMET) - Lipid Profile - triamterene-hydrochlorothiazide (MAXZIDE-25) 37.5-25 MG tablet; Take 1 tablet by mouth daily.  Dispense: 90 tablet; Refill: 1  3. Pure hypercholesterolemia Diet controlled. Will check labs as below and f/u pending results. - CBC w/Diff/Platelet - Comprehensive Metabolic Panel (CMET) - Lipid Profile  4. Interstitial cystitis Stable. Diagnosis pulled for medication refill. Continue current medical treatment plan. - amitriptyline (ELAVIL) 50 MG tablet; Take 1 tablet (50 mg total) by mouth at bedtime.  Dispense: 90 tablet; Refill: 1  5. Gastroesophageal reflux disease without esophagitis Stable. Diagnosis pulled for medication refill. Continue current medical treatment plan. - pantoprazole (PROTONIX) 40 MG tablet; Take 1 tablet (40 mg total) by mouth daily.  Dispense: 90 tablet; Refill: 1

## 2018-08-11 ENCOUNTER — Telehealth: Payer: Self-pay

## 2018-08-11 LAB — CBC WITH DIFFERENTIAL/PLATELET
Basophils Absolute: 0 10*3/uL (ref 0.0–0.2)
Basos: 1 %
EOS (ABSOLUTE): 0.1 10*3/uL (ref 0.0–0.4)
Eos: 1 %
Hematocrit: 39.4 % (ref 34.0–46.6)
Hemoglobin: 13.8 g/dL (ref 11.1–15.9)
Immature Grans (Abs): 0 10*3/uL (ref 0.0–0.1)
Immature Granulocytes: 0 %
Lymphocytes Absolute: 1.5 10*3/uL (ref 0.7–3.1)
Lymphs: 19 %
MCH: 31 pg (ref 26.6–33.0)
MCHC: 35 g/dL (ref 31.5–35.7)
MCV: 89 fL (ref 79–97)
Monocytes Absolute: 0.5 10*3/uL (ref 0.1–0.9)
Monocytes: 6 %
Neutrophils Absolute: 5.6 10*3/uL (ref 1.4–7.0)
Neutrophils: 73 %
Platelets: 244 10*3/uL (ref 150–450)
RBC: 4.45 x10E6/uL (ref 3.77–5.28)
RDW: 12 % (ref 11.7–15.4)
WBC: 7.7 10*3/uL (ref 3.4–10.8)

## 2018-08-11 LAB — COMPREHENSIVE METABOLIC PANEL
ALT: 15 IU/L (ref 0–32)
AST: 18 IU/L (ref 0–40)
Albumin/Globulin Ratio: 1.7 (ref 1.2–2.2)
Albumin: 4.6 g/dL (ref 3.8–4.8)
Alkaline Phosphatase: 71 IU/L (ref 39–117)
BUN/Creatinine Ratio: 21 (ref 12–28)
BUN: 15 mg/dL (ref 8–27)
Bilirubin Total: 0.3 mg/dL (ref 0.0–1.2)
CO2: 24 mmol/L (ref 20–29)
Calcium: 9.5 mg/dL (ref 8.7–10.3)
Chloride: 94 mmol/L — ABNORMAL LOW (ref 96–106)
Creatinine, Ser: 0.71 mg/dL (ref 0.57–1.00)
GFR calc Af Amer: 103 mL/min/{1.73_m2} (ref >59)
GFR calc non Af Amer: 89 mL/min/{1.73_m2} (ref >59)
Globulin, Total: 2.7 g/dL (ref 1.5–4.5)
Glucose: 81 mg/dL (ref 65–99)
Potassium: 4.3 mmol/L (ref 3.5–5.2)
Sodium: 135 mmol/L (ref 134–144)
Total Protein: 7.3 g/dL (ref 6.0–8.5)

## 2018-08-11 LAB — LIPID PANEL
Chol/HDL Ratio: 2.7 ratio (ref 0.0–4.4)
Cholesterol, Total: 187 mg/dL (ref 100–199)
HDL: 69 mg/dL (ref >39)
LDL Calculated: 104 mg/dL — ABNORMAL HIGH (ref 0–99)
Triglycerides: 68 mg/dL (ref 0–149)
VLDL Cholesterol Cal: 14 mg/dL (ref 5–40)

## 2018-08-11 NOTE — Telephone Encounter (Signed)
-----   Message from Mar Daring, [place] sent at 08/11/2018  9:12 AM EDT ----- All labs are within normal limits and stable.  Thanks! -JB

## 2018-08-11 NOTE — Telephone Encounter (Signed)
Her LDL is just borderline elevated at 104. We use the hyperlipidemia due to this borderline reading to get the lipid panel covered every year.

## 2018-08-11 NOTE — Telephone Encounter (Signed)
Explained to patient

## 2018-08-11 NOTE — Telephone Encounter (Signed)
Pt advised.   She reports her chart shows she has high cholesterol.  She does not recall that being an issue in the past.  Please advise.    Thanks,   -Mickel Baas

## 2018-08-19 ENCOUNTER — Other Ambulatory Visit: Payer: Self-pay | Admitting: Obstetrics and Gynecology

## 2018-09-10 DIAGNOSIS — Z20828 Contact with and (suspected) exposure to other viral communicable diseases: Secondary | ICD-10-CM | POA: Diagnosis not present

## 2018-09-20 DIAGNOSIS — M2012 Hallux valgus (acquired), left foot: Secondary | ICD-10-CM | POA: Diagnosis not present

## 2018-09-20 DIAGNOSIS — M216X2 Other acquired deformities of left foot: Secondary | ICD-10-CM | POA: Diagnosis not present

## 2018-09-20 DIAGNOSIS — S93145D Subluxation of metatarsophalangeal joint of left lesser toe(s), subsequent encounter: Secondary | ICD-10-CM | POA: Diagnosis not present

## 2018-09-20 DIAGNOSIS — M79672 Pain in left foot: Secondary | ICD-10-CM | POA: Diagnosis not present

## 2018-09-20 DIAGNOSIS — M2011 Hallux valgus (acquired), right foot: Secondary | ICD-10-CM | POA: Diagnosis not present

## 2018-10-18 DIAGNOSIS — M2012 Hallux valgus (acquired), left foot: Secondary | ICD-10-CM | POA: Diagnosis not present

## 2018-10-18 DIAGNOSIS — S93145D Subluxation of metatarsophalangeal joint of left lesser toe(s), subsequent encounter: Secondary | ICD-10-CM | POA: Diagnosis not present

## 2018-10-18 DIAGNOSIS — M216X2 Other acquired deformities of left foot: Secondary | ICD-10-CM | POA: Diagnosis not present

## 2018-10-24 ENCOUNTER — Ambulatory Visit: Payer: Medicare Other | Admitting: Family Medicine

## 2018-10-24 ENCOUNTER — Other Ambulatory Visit: Payer: Self-pay

## 2018-10-24 ENCOUNTER — Ambulatory Visit (INDEPENDENT_AMBULATORY_CARE_PROVIDER_SITE_OTHER): Payer: Medicare Other | Admitting: Physician Assistant

## 2018-10-24 ENCOUNTER — Encounter: Payer: Self-pay | Admitting: Physician Assistant

## 2018-10-24 VITALS — Wt 132.0 lb

## 2018-10-24 DIAGNOSIS — H6981 Other specified disorders of Eustachian tube, right ear: Secondary | ICD-10-CM | POA: Diagnosis not present

## 2018-10-24 MED ORDER — PREDNISONE 10 MG (21) PO TBPK: ORAL_TABLET | ORAL | 0 refills | Status: DC

## 2018-10-24 NOTE — Progress Notes (Signed)
Patient: [name] Female    DOB: [birth year]   68 y.o.   MRN: [id number] Visit Date: 10/24/2018  Today's Provider: Mar Daring, PA-C   Chief Complaint  Patient presents with  . Ear Fullness   Subjective:    I,Joseline E. Rosas,RMA am acting as a Education administrator for [employer], PA-C.  Virtual Visit via Telephone Note  I connected with ROXANN VIERRA on 10/24/18 at  2:40 PM EDT by telephone and verified that I am speaking with the correct person using two identifiers.  Location: Patient: Home Provider: Home   I discussed the limitations, risks, security and privacy concerns of performing an evaluation and management service by telephone and the availability of in person appointments. I also discussed with the patient that there may be a patient responsible charge related to this service. The patient expressed understanding and agreed to proceed.   HPI  Patient c/o ear fullness mostly on the right side. Started over the weekend. Some sinus congestion, but no more than normal. No other symptoms. She has been using flonase daily. Has had cerumen impaction once before but reports that even after cleaning the symptoms of ear fullness persisted. She has not been exposed to covid recently. Denies fevers, chills, fatigue, cough, headaches, myalgias. Was tested a few weeks ago after a coworker became ill.   Allergies  Allergen Reactions  . Doxycycline Nausea Only  . Penicillins Rash  . Pneumococcal 13-Val Conj Vacc Rash    Localized swelling and erythema of upper extremity; patient refuses pneumococcal 23 and wants documented she will not take due to this reaction Localized swelling and erythema of upper extremity; patient refuses pneumococcal 23 and wants documented she will not take due to this reaction  . Sulfa Antibiotics Rash     Current Outpatient Medications:  .  amitriptyline (ELAVIL) 50 MG tablet, Take 1 tablet (50 mg total) by mouth at bedtime., Disp: 90  tablet, Rfl: 1 .  Calcium Carbonate-Vitamin D 600-200 MG-UNIT CAPS, Take 2 tablets by mouth daily., Disp: , Rfl:  .  Calcium Glycerophosphate (PRELIEF PO), Take 2 tablets by mouth daily. As needed , Disp: , Rfl:  .  clobetasol ointment (TEMOVATE) 0.05 %, Apply to affected area every night for 4 weeks, then every other day for 4 weeks and then twice a week for4 weeks or until resolution., Disp: 30 g, Rfl: 0 .  fluticasone (FLONASE) 50 MCG/ACT nasal spray, Place 2 sprays into both nostrils daily., Disp: 16 g, Rfl: 6 .  MYRBETRIQ 50 MG TB24 tablet, , Disp: , Rfl:  .  pantoprazole (PROTONIX) 40 MG tablet, Take 1 tablet (40 mg total) by mouth daily., Disp: 90 tablet, Rfl: 1 .  triamterene-hydrochlorothiazide (MAXZIDE-25) 37.5-25 MG tablet, Take 1 tablet by mouth daily., Disp: 90 tablet, Rfl: 1 .  vitamin C (ASCORBIC ACID) 500 MG tablet, Take 500 mg by mouth daily., Disp: , Rfl:  .  aspirin EC 325 MG tablet, Take by mouth., Disp: , Rfl:   Review of Systems  Constitutional: Negative for fatigue and fever.  HENT: Positive for ear pain (ear fullness) and sinus pressure. Negative for ear discharge, hearing loss, postnasal drip, sinus pain, sneezing, sore throat and tinnitus.   Respiratory: Negative for cough, chest tightness and shortness of breath.   Cardiovascular: Negative for chest pain, palpitations and leg swelling.  Gastrointestinal: Negative for abdominal pain.  Neurological: Negative for dizziness and headaches.    Social History  Tobacco Use  . Smoking status: Former Smoker    Packs/day: 1.00    Years: 35.00    Pack years: 35.00    Quit date: 01/20/1996    Years since quitting: 22.7  . Smokeless tobacco: Never Used  Substance Use Topics  . Alcohol use: No      Objective:   Wt 132 lb (59.9 kg)   BMI 21.31 kg/m  Vitals:   10/24/18 1120  Weight: 132 lb (59.9 kg)  Body mass index is 21.31 kg/m.   Physical Exam Vitals signs reviewed.  Constitutional:      General: She is  not in acute distress. Pulmonary:     Effort: Pulmonary effort is normal. No respiratory distress.  Neurological:     Mental Status: She is alert.  Psychiatric:        Mood and Affect: Mood normal.        Thought Content: Thought content normal.      No results found for any visits on 10/24/18.     Assessment & Plan     1. Dysfunction of right eustachian tube Suspect ETD vs cerumen impaction. Will treat with prednisone as below for possible ETD. She is to call if symptoms fail to improve or change.  - predniSONE (STERAPRED UNI-PAK 21 TAB) 10 MG (21) TBPK tablet; 6 day taper; take as directed on package instructions  Dispense: 21 tablet; Refill: 0   I discussed the assessment and treatment plan with the patient. The patient was provided an opportunity to ask questions and all were answered. The patient agreed with the plan and demonstrated an understanding of the instructions.   The patient was advised to call back or seek an in-person evaluation if the symptoms worsen or if the condition fails to improve as anticipated.  I provided 14 minutes of non-face-to-face time during this encounter.    Margaretann Loveless, PA-C  [facility]

## 2018-10-27 ENCOUNTER — Telehealth: Payer: Self-pay

## 2018-10-27 DIAGNOSIS — H6981 Other specified disorders of Eustachian tube, right ear: Secondary | ICD-10-CM

## 2018-10-27 MED ORDER — PREDNISONE 10 MG (21) PO TBPK: ORAL_TABLET | ORAL | 0 refills | Status: DC

## 2018-10-27 NOTE — Telephone Encounter (Signed)
NA will try later

## 2018-10-27 NOTE — Telephone Encounter (Signed)
Sent in another round of prednisone

## 2018-10-27 NOTE — Telephone Encounter (Signed)
Patient states her ears are starting to clog up again. She is on day 4 of the prednisone medication [name] prescribed. At first her ears improved, but then last night they started to feel clogged up again. Patient has plans to go out of town tomorrow morning at [place], and wants this taken care of before she leaves. Please advise.   Pharmacy: [facility].

## 2018-10-27 NOTE — Telephone Encounter (Signed)
Patient advised.

## 2018-10-27 NOTE — Telephone Encounter (Signed)
Please Review

## 2018-11-07 DIAGNOSIS — H9011 Conductive hearing loss, unilateral, right ear, with unrestricted hearing on the contralateral side: Secondary | ICD-10-CM | POA: Diagnosis not present

## 2018-11-07 DIAGNOSIS — H903 Sensorineural hearing loss, bilateral: Secondary | ICD-10-CM | POA: Diagnosis not present

## 2018-12-19 ENCOUNTER — Other Ambulatory Visit: Payer: Self-pay

## 2018-12-19 ENCOUNTER — Encounter: Payer: Self-pay | Admitting: Emergency Medicine

## 2018-12-19 ENCOUNTER — Ambulatory Visit
Admission: EM | Admit: 2018-12-19 | Discharge: 2018-12-19 | Disposition: A | Discharge status: Home / Self Care | Payer: Medicare Other | Attending: Emergency Medicine | Admitting: Emergency Medicine

## 2018-12-19 DIAGNOSIS — Z0189 Encounter for other specified special examinations: Secondary | ICD-10-CM | POA: Diagnosis not present

## 2018-12-19 DIAGNOSIS — Z20828 Contact with and (suspected) exposure to other viral communicable diseases: Secondary | ICD-10-CM

## 2018-12-19 DIAGNOSIS — Z20822 Contact with and (suspected) exposure to covid-19: Secondary | ICD-10-CM

## 2018-12-19 NOTE — ED Provider Notes (Signed)
[name]    CSN: [id number] Arrival date & time: 12/19/18  1022      History   Chief Complaint Chief Complaint  Patient presents with  . covid test    HPI [name] is a 68 y.o. female.   Patient presents with request for a COVID test.  Her husband tested positive 3 days ago.  She denies current symptoms, including fever, chills, rhinorrhea, congestion, sore throat, cough, shortness of breath, vomiting, diarrhea, rash.  No treatments attempted at home.  The history is provided by the patient.    Past Medical History:  Diagnosis Date  . Complication of anesthesia   . GERD (gastroesophageal reflux disease)   . Hyperlipidemia   . Hypertension   . IC (interstitial cystitis)   . PONV (postoperative nausea and vomiting)    NO PROBLEM     4/18 WITH RETINAL SURGERY    Patient Active Problem List   Diagnosis Date Noted  . Hyperlipidemia   . Age-related osteoporosis without current pathological fracture 06/03/2017  . History of right cataract surgery 09/29/2016  . Chest pain on exertion 05/28/2016  . Right retinal detachment 05/01/2016  . Varicose veins of both lower extremities with inflammation 11/21/2015  . Chronic venous insufficiency 11/21/2015  . Pain in limb 11/21/2015  . GERD (gastroesophageal reflux disease) 11/23/2014  . Allergic rhinitis 07/17/2014  . History of tobacco use 02/26/2006  . Interstitial cystitis 02/22/2006  . Abdominal hernia with gangrene and obstruction 02/22/2006  . Hypertension 04/22/1999    Past Surgical History:  Procedure Laterality Date  . BLADDER SUSPENSION     TRANSVAGINAL SLING  . BUNIONECTOMY  12/02/2017  . CATARACT EXTRACTION W/PHACO Right 09/29/2016   Procedure: CATARACT EXTRACTION PHACO AND INTRAOCULAR LENS PLACEMENT (IOC);  Surgeon: Galen Manila, MD;  Location: [facility] ORS;  Service: Ophthalmology;  Laterality: Right;  [place] 00:47 AP% 21.6 CDE 10.26 fluid pack lot # [id number]  . CHOLECYSTECTOMY    . EYE SURGERY     . INSERTION OF MESH  2008   Trans vaginal mesh  . LASER ABLATION Right 11/21/2015   [facility] Dr. Gilda Crease    OB History    Gravida  2   Para  2   Term  2   Preterm      AB      Living  2     SAB      TAB      Ectopic      Multiple      Live Births  2            Home Medications    Prior to Admission medications   Medication Sig Start Date End Date Taking? Authorizing Provider  amitriptyline (ELAVIL) 50 MG tablet Take 1 tablet (50 mg total) by mouth at bedtime. 08/10/18   Margaretann Loveless, PA-C  Calcium Carbonate-Vitamin D 600-200 MG-UNIT CAPS Take 2 tablets by mouth daily. 07/15/07   [provider]  Calcium Glycerophosphate (PRELIEF PO) Take 2 tablets by mouth daily. As needed     [provider]  clobetasol ointment (TEMOVATE) 0.05 % Apply to affected area every night for 4 weeks, then every other day for 4 weeks and then twice a week for4 weeks or until resolution. 08/19/18   Schuman, Christanna R, MD  fluticasone (FLONASE) 50 MCG/ACT nasal spray Place 2 sprays into both nostrils daily. 07/02/16   Margaretann Loveless, PA-C  MYRBETRIQ 50 MG TB24 tablet  06/03/17  [provider]  pantoprazole (PROTONIX) 40 MG tablet Take 1 tablet (40 mg total) by mouth daily. 08/10/18   Margaretann LovelessBurnette, Jennifer M, PA-C  predniSONE (STERAPRED UNI-PAK 21 TAB) 10 MG (21) TBPK tablet 6 day taper; take as directed on package instructions 10/27/18   Margaretann LovelessBurnette, Jennifer M, PA-C  triamterene-hydrochlorothiazide (MAXZIDE-25) 37.5-25 MG tablet Take 1 tablet by mouth daily. 08/10/18   Margaretann LovelessBurnette, Jennifer M, PA-C  vitamin C (ASCORBIC ACID) 500 MG tablet Take 500 mg by mouth daily.    [provider]    Family History Family History  Problem Relation Age of Onset  . Cancer Father        Bladder  . Bladder Cancer Father   . Hypertension Father   . Coronary artery disease Father        Stent Placed 1990  . Liver disease Father   .  Congestive Heart Failure Mother   . Healthy Sister   . Hypertension Sister   . Breast cancer Paternal Aunt   . Healthy Son   . Healthy Son     Social History Social History   Tobacco Use  . Smoking status: Former Smoker    Packs/day: 1.00    Years: 35.00    Pack years: 35.00    Quit date: 01/20/1996    Years since quitting: 22.9  . Smokeless tobacco: Never Used  Substance Use Topics  . Alcohol use: No  . Drug use: No     Allergies   Doxycycline, Penicillins, Pneumococcal 13-val conj vacc, and Sulfa antibiotics   Review of Systems Review of Systems  Constitutional: Negative for chills and fever.  HENT: Negative for congestion, ear pain, rhinorrhea and sore throat.   Eyes: Negative for pain and visual disturbance.  Respiratory: Negative for cough and shortness of breath.   Cardiovascular: Negative for chest pain and palpitations.  Gastrointestinal: Negative for abdominal pain, diarrhea, nausea and vomiting.  Genitourinary: Negative for dysuria and hematuria.  Musculoskeletal: Negative for arthralgias and back pain.  Skin: Negative for color change and rash.  Neurological: Negative for seizures and syncope.  All other systems reviewed and are negative.    Physical Exam Triage Vital Signs ED Triage Vitals  Enc Vitals Group     BP 12/19/18 1032 (!) 157/89     Pulse Rate 12/19/18 1032 82     Resp 12/19/18 1032 18     Temp 12/19/18 1032 97.8 F (36.6 C)     Temp src --      SpO2 12/19/18 1032 97 %     Weight 12/19/18 1030 132 lb (59.9 kg)     Height --      Head Circumference --      Peak Flow --      Pain Score --      Pain Loc --      Pain Edu? --      Excl. in GC? --    No data found.  Updated Vital Signs BP (!) 157/89   Pulse 82   Temp 97.8 F (36.6 C)   Resp 18   Wt 132 lb (59.9 kg)   SpO2 97%   BMI 21.31 kg/m   Visual Acuity Right Eye Distance:   Left Eye Distance:   Bilateral Distance:    Right Eye Near:   Left Eye Near:    Bilateral  Near:     Physical Exam Vitals signs and nursing note reviewed.  Constitutional:      General: She  is not in acute distress.    Appearance: She is well-developed.  HENT:     Head: Normocephalic and atraumatic.     Right Ear: Tympanic membrane normal.     Left Ear: Tympanic membrane normal.     Nose: Nose normal.     Mouth/Throat:     Mouth: Mucous membranes are moist.     Pharynx: Oropharynx is clear.  Eyes:     Conjunctiva/sclera: Conjunctivae normal.  Neck:     Musculoskeletal: Neck supple.  Cardiovascular:     Rate and Rhythm: Normal rate and regular rhythm.     Heart sounds: No murmur.  Pulmonary:     Effort: Pulmonary effort is normal. No respiratory distress.     Breath sounds: Normal breath sounds.  Abdominal:     General: Bowel sounds are normal.     Palpations: Abdomen is soft.     Tenderness: There is no abdominal tenderness. There is no guarding or rebound.  Skin:    General: Skin is warm and dry.     Findings: No rash.  Neurological:     General: No focal deficit present.     Mental Status: She is alert and oriented to person, place, and time.      UC Treatments / Results  Labs (all labs ordered are listed, but only abnormal results are displayed) Labs Reviewed  NOVEL CORONAVIRUS, NAA    EKG   Radiology No results found.  Procedures Procedures (including critical care time)  Medications Ordered in UC Medications - No data to display  Initial Impression / Assessment and Plan / UC Course  I have reviewed the triage vital signs and the nursing notes.  Pertinent labs & imaging results that were available during my care of the patient were reviewed by me and considered in my medical decision making (see chart for details).   Patient request for COVID test after exposure to her husband who tested positive 3 days ago.  COVID test performed here.  Instructed patient to self quarantine until the test result is back.  Instructed patient to go to the  emergency department if she develops high fever, shortness of breath, severe diarrhea, or other concerning symptoms.  Patient agrees with plan of care.    Final Clinical Impressions(s) / UC Diagnoses   Final diagnoses:  Patient requested diagnostic testing     Discharge Instructions     Your COVID test is pending.  You should self quarantine until your test result is back and is negative.    Go to the emergency department if you develop high fever, shortness of breath, severe diarrhea, or other concerning symptoms.       ED Prescriptions    None     PDMP not reviewed this encounter.   Sharion Balloon, NP 12/19/18 1110

## 2018-12-19 NOTE — Discharge Instructions (Addendum)
Your COVID test is pending.  You should self quarantine until your test result is back and is negative.   ° °Go to the emergency department if you develop high fever, shortness of breath, severe diarrhea, or other concerning symptoms.   ° °

## 2018-12-19 NOTE — ED Triage Notes (Signed)
Patient in office for covid test only  Husband tested positive for covid no symptoms

## 2018-12-20 LAB — NOVEL CORONAVIRUS, NAA: SARS-CoV-2, NAA: DETECTED — AB

## 2018-12-21 ENCOUNTER — Telehealth: Payer: Self-pay | Admitting: Emergency Medicine

## 2018-12-21 NOTE — Telephone Encounter (Signed)

## 2018-12-22 ENCOUNTER — Other Ambulatory Visit: Payer: Self-pay | Admitting: Nurse Practitioner

## 2018-12-22 ENCOUNTER — Telehealth: Payer: Self-pay | Admitting: Nurse Practitioner

## 2018-12-22 DIAGNOSIS — U071 COVID-19: Secondary | ICD-10-CM

## 2018-12-22 DIAGNOSIS — I10 Essential (primary) hypertension: Secondary | ICD-10-CM

## 2018-12-22 NOTE — Progress Notes (Signed)
  I connected by phone with [name] on 12/22/2018 at 9:41 AM to discuss the potential use of an new treatment for mild to moderate COVID-19 viral infection in non-hospitalized patients.  This patient is a 68 y.o. female that meets the FDA criteria for Emergency Use Authorization of bamlanivimab:  Has a (+) direct SARS-CoV-2 viral test result  Has mild or moderate COVID-19   Is ? 68 years of age and weighs ? 40 kg  Is NOT hospitalized due to COVID-19  Is NOT requiring oxygen therapy or requiring an increase in baseline oxygen flow rate due to COVID-19  Is within 10 days of symptom onset  Has at least one of the high risk factor(s) for progression to severe COVID-19 and/or hospitalization as defined in EUA.  Specific high risk criteria : Hypertension  Patient Active Problem List   Diagnosis Date Noted  . Hyperlipidemia   . Age-related osteoporosis without current pathological fracture 06/03/2017  . History of right cataract surgery 09/29/2016  . Chest pain on exertion 05/28/2016  . Right retinal detachment 05/01/2016  . Varicose veins of both lower extremities with inflammation 11/21/2015  . Chronic venous insufficiency 11/21/2015  . Pain in limb 11/21/2015  . GERD (gastroesophageal reflux disease) 11/23/2014  . Allergic rhinitis 07/17/2014  . History of tobacco use 02/26/2006  . Interstitial cystitis 02/22/2006  . Abdominal hernia with gangrene and obstruction 02/22/2006  . Hypertension 04/22/1999    I have spoken and communicated the following to the patient or parent/caregiver:  1. FDA has authorized the emergency use of bamlanivimab for the treatment of mild to moderate COVID-19 in adults and pediatric patients with positive results of direct SARS-CoV-2 viral testing who are 85 years of age and older weighing at least 40 kg, and who are at high risk for progressing to severe COVID-19 and/or hospitalization.  2. The significant known and potential risks and benefits of  bamlanivimab, and the extent to which such potential risks and benefits are unknown.  3. Information on available alternative treatments and the risks and benefits of those alternatives, including clinical trials.  4. Patients treated with bamlanivimab should continue to self-isolate and use infection control measures (e.g., wear mask, isolate, social distance, avoid sharing personal items, clean and disinfect "high touch" surfaces, and frequent handwashing) according to CDC guidelines.   5. The patient or parent/caregiver has the option to accept or refuse bamlanivimab.  After reviewing this information with the patient, The patient agreed to proceed with receiving the infusion of bamlanivimab and will be provided a copy of the Fact sheet prior to receiving the infusion.  [name] 12/22/2018 9:41 AM

## 2018-12-22 NOTE — Telephone Encounter (Signed)
.  Called to Discuss with patient about Covid symptoms and the use of bamlanivimab, a monoclonal antibody infusion for those with mild to moderate Covid symptoms and at a high risk of hospitalization.     Pt is qualified for this infusion at the [facility] infusion center due to co-morbid conditions and/or a member of an at-risk group.    Patient is currently being managed for the following problems;  Patient Active Problem List   Diagnosis Date Noted  . Hyperlipidemia   . Age-related osteoporosis without current pathological fracture 06/03/2017  . History of right cataract surgery 09/29/2016  . Chest pain on exertion 05/28/2016  . Right retinal detachment 05/01/2016  . Varicose veins of both lower extremities with inflammation 11/21/2015  . Chronic venous insufficiency 11/21/2015  . Pain in limb 11/21/2015  . GERD (gastroesophageal reflux disease) 11/23/2014  . Allergic rhinitis 07/17/2014  . History of tobacco use 02/26/2006  . Interstitial cystitis 02/22/2006  . Abdominal hernia with gangrene and obstruction 02/22/2006  . Hypertension 04/22/1999    Patient would like to think about treatment and call us back.

## 2018-12-23 ENCOUNTER — Ambulatory Visit (HOSPITAL_COMMUNITY)
Admission: RE | Admit: 2018-12-23 | Discharge: 2018-12-23 | Disposition: A | Discharge status: Home / Self Care | Payer: Medicare Other | Source: Ambulatory Visit | Attending: Critical Care Medicine | Admitting: Critical Care Medicine

## 2018-12-23 DIAGNOSIS — I10 Essential (primary) hypertension: Secondary | ICD-10-CM | POA: Insufficient documentation

## 2018-12-23 DIAGNOSIS — Z23 Encounter for immunization: Secondary | ICD-10-CM | POA: Diagnosis not present

## 2018-12-23 DIAGNOSIS — U071 COVID-19: Secondary | ICD-10-CM | POA: Diagnosis not present

## 2018-12-23 MED ORDER — ALBUTEROL SULFATE HFA 108 (90 BASE) MCG/ACT IN AERS: 2.0000 | INHALATION_SPRAY | Freq: Once | RESPIRATORY_TRACT | Status: DC | PRN

## 2018-12-23 MED ORDER — SODIUM CHLORIDE 0.9 % IV SOLN
700.0000 mg | Freq: Once | INTRAVENOUS | Status: AC
  Administered 2018-12-23: 700 mg via INTRAVENOUS
  Filled 2018-12-23: qty 20

## 2018-12-23 MED ORDER — SODIUM CHLORIDE 0.9 % IV SOLN: INTRAVENOUS | Status: DC | PRN

## 2018-12-23 MED ORDER — FAMOTIDINE IN NACL 20-0.9 MG/50ML-% IV SOLN: 20.0000 mg | Freq: Once | INTRAVENOUS | Status: DC | PRN

## 2018-12-23 MED ORDER — METHYLPREDNISOLONE SODIUM SUCC 125 MG IJ SOLR: 125.0000 mg | Freq: Once | INTRAMUSCULAR | Status: DC | PRN

## 2018-12-23 MED ORDER — DIPHENHYDRAMINE HCL 50 MG/ML IJ SOLN: 50.0000 mg | Freq: Once | INTRAMUSCULAR | Status: DC | PRN

## 2018-12-23 MED ORDER — EPINEPHRINE 0.3 MG/0.3ML IJ SOAJ: 0.3000 mg | Freq: Once | INTRAMUSCULAR | Status: DC | PRN

## 2018-12-23 NOTE — Progress Notes (Signed)
  Diagnosis: COVID-19  Physician: Dr. Joya Gaskins  Procedure: bamlanivimab infusion Provided patient with bamlanivimab fact sheet for patients, parents and caregivers prior to infusion.  Complications: Pt tolerated without difficulty.   Discharge: Discharged home   [name], Cleaster Corin 12/23/2018

## 2018-12-23 NOTE — Progress Notes (Signed)
Medication has been infusing for 15 minutes. Patient reports no complaints at this time. Will continue to monitor. 

## 2018-12-23 NOTE — Progress Notes (Addendum)
Infusion initiated at 1124.

## 2019-01-21 IMAGING — MG DIGITAL SCREENING BILATERAL MAMMOGRAM WITH TOMO AND CAD
6 of 10 series · 6 of 30 positions shown · non-contrast
Comparison: Previous exam(s).

CLINICAL DATA: Screening.

EXAM:
DIGITAL SCREENING BILATERAL MAMMOGRAM WITH TOMO AND CAD

[L MLO synth-2D]
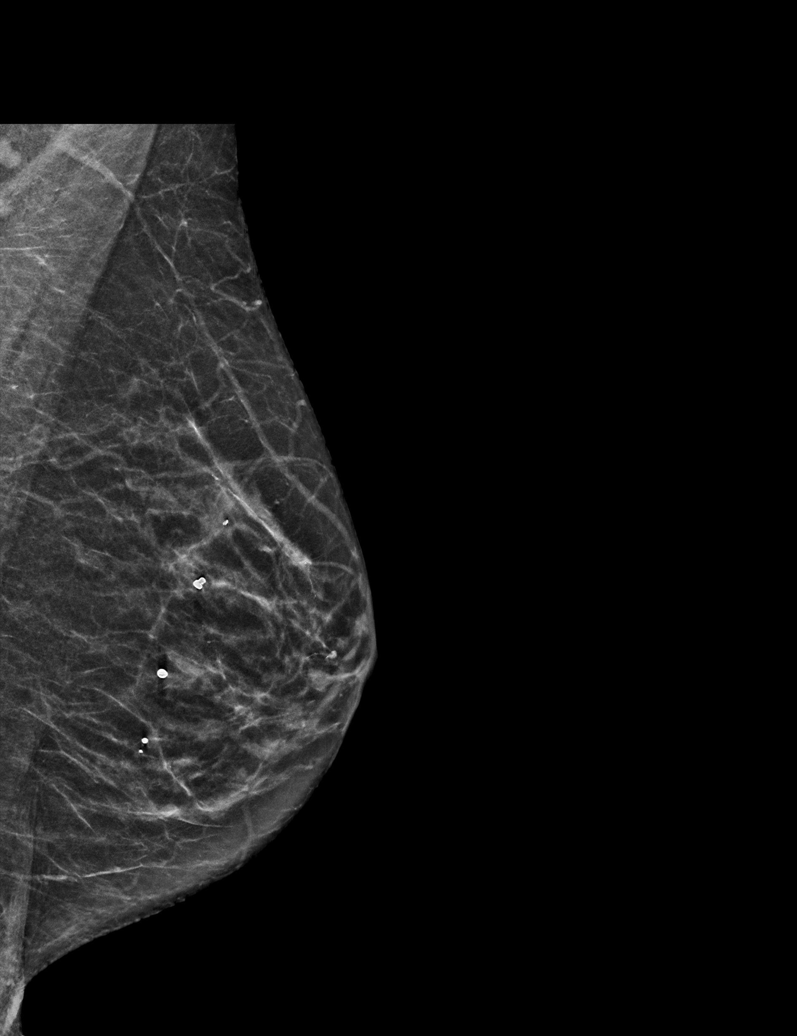

[L CC synth-2D]
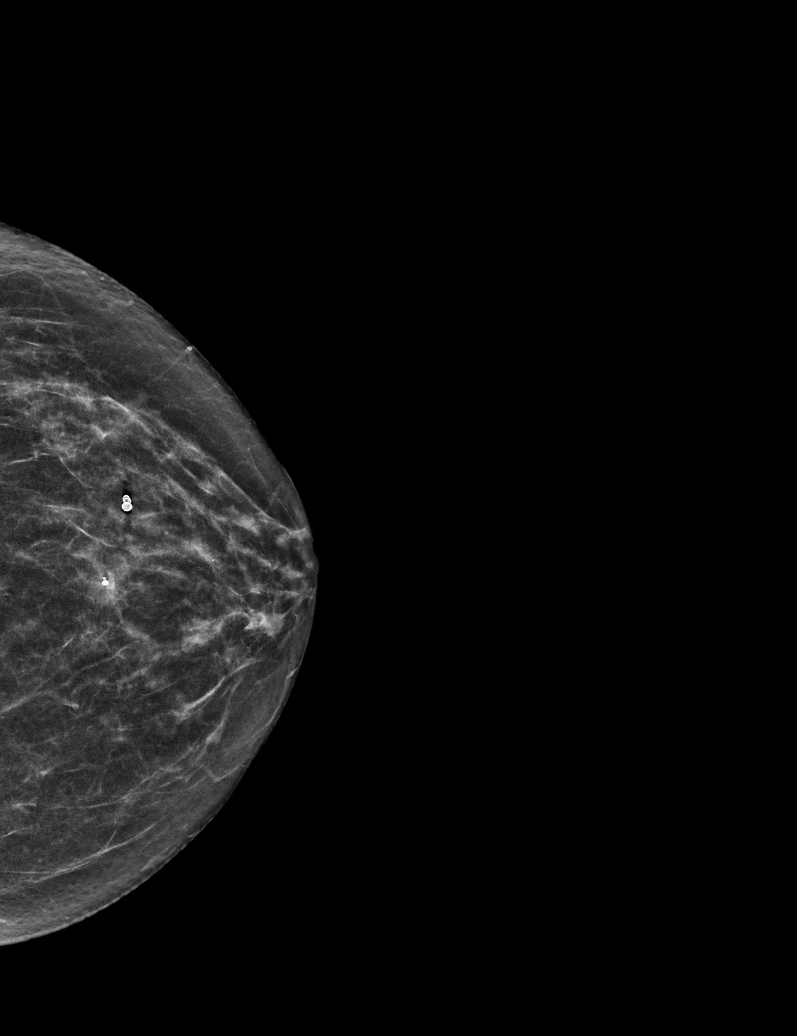

[R CC synth-2D (1 of 2)]
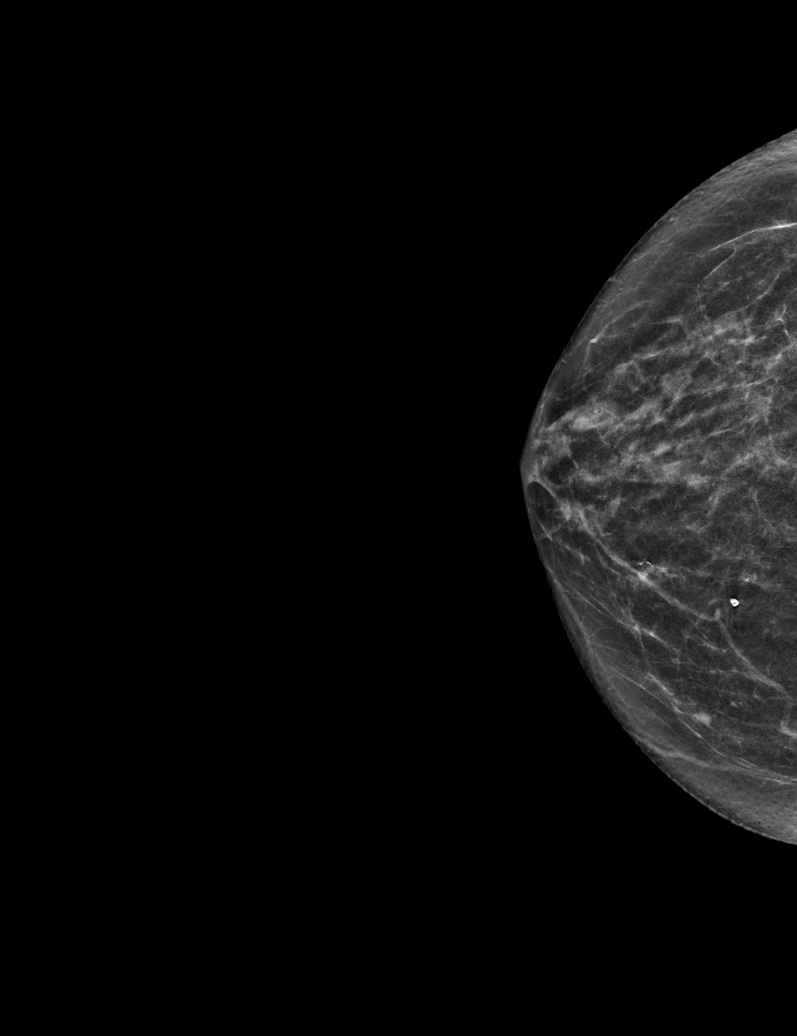

[R CC synth-2D (2 of 2)]
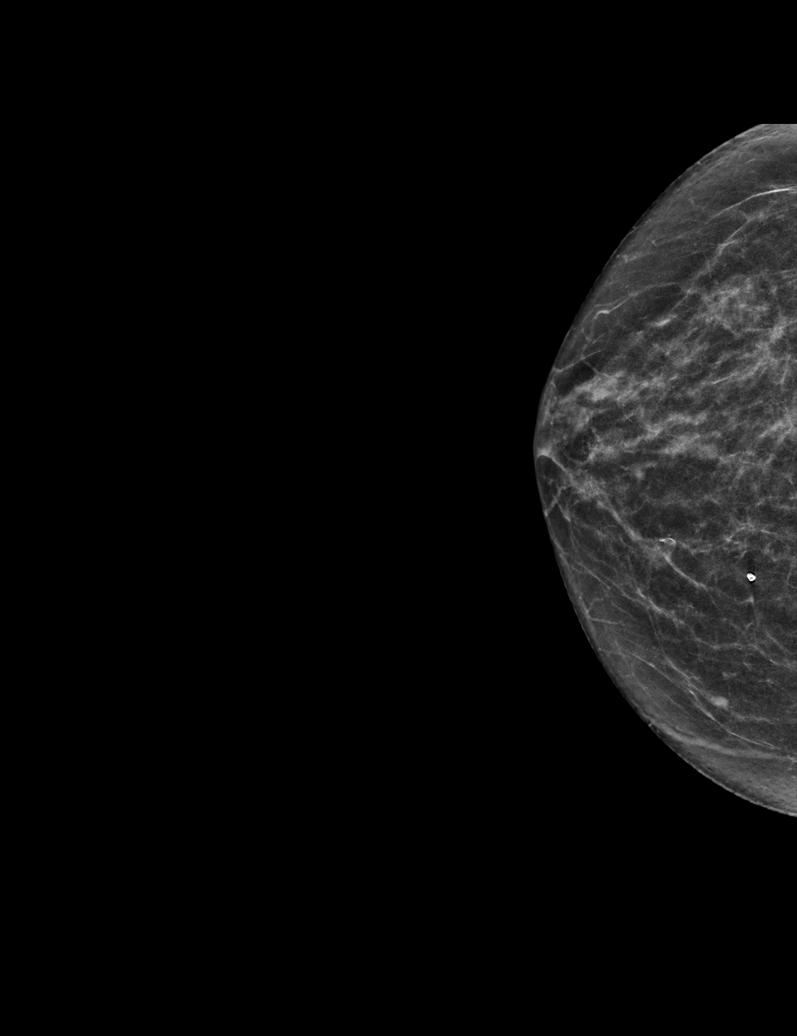

[R MLO synth-2D]
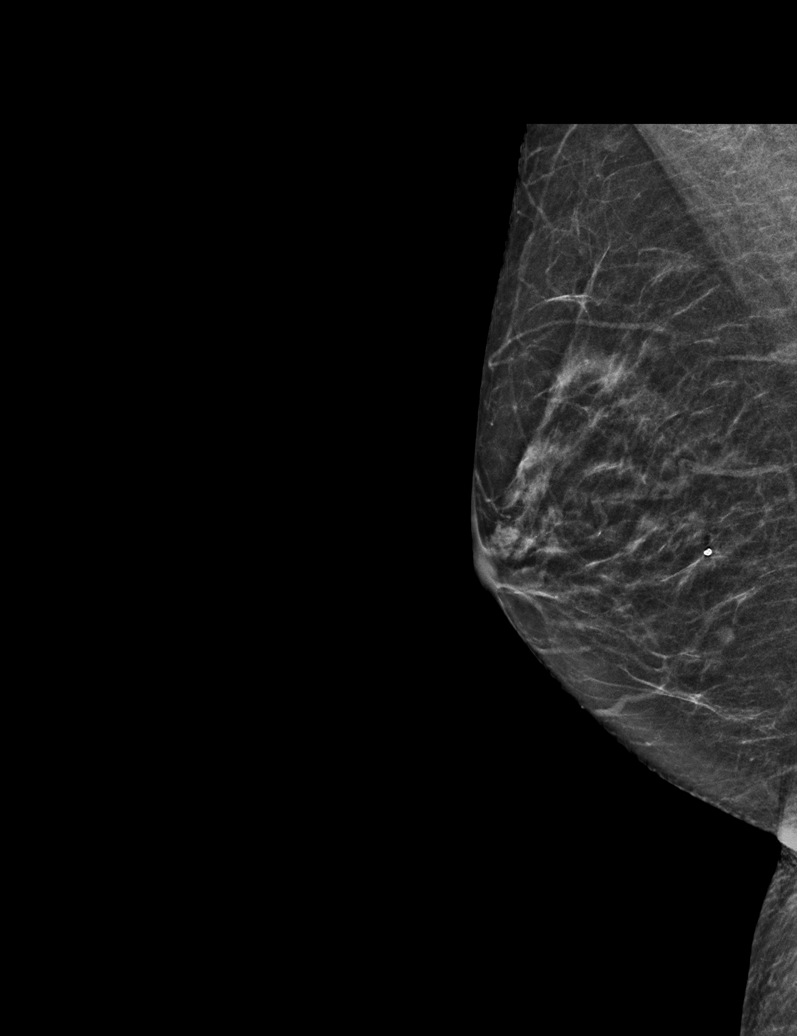

[R MLO tomo · tomo slice 23/44.0]
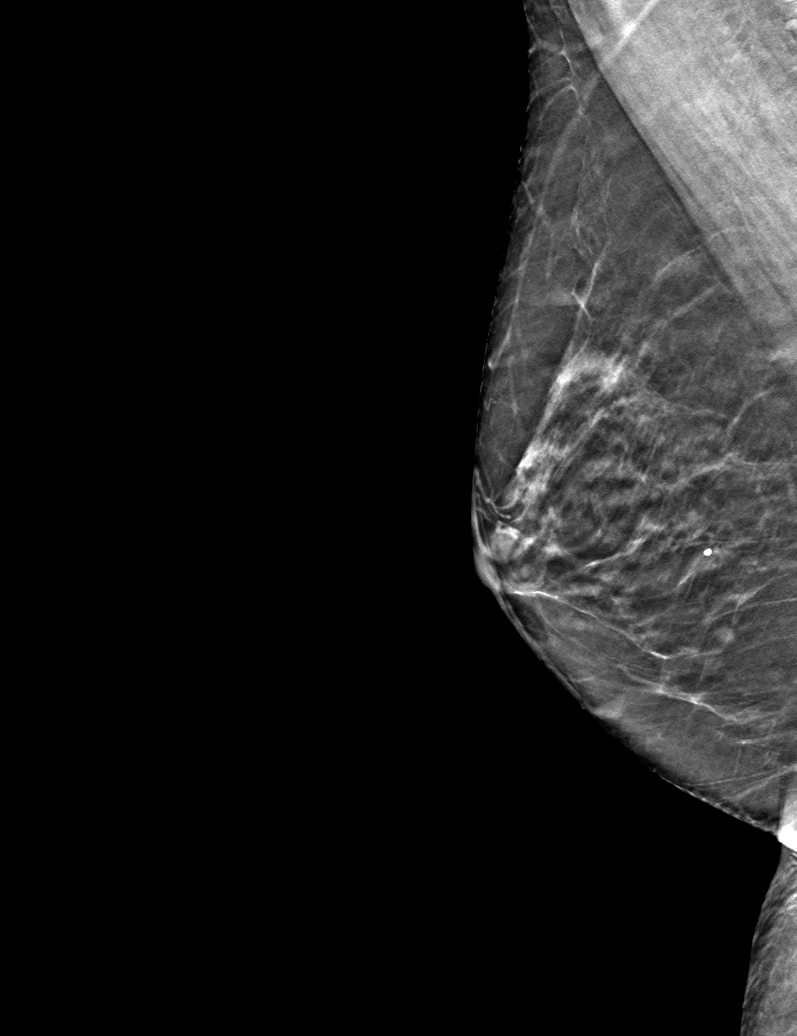

[6 of 30 positions shown; findings below may reference images not displayed]

ACR Breast Density Category b: There are scattered areas of
fibroglandular density.
FINDINGS: There are no findings suspicious for malignancy. Images were
processed with CAD.
IMPRESSION: No mammographic evidence of malignancy. A result letter of this
screening mammogram will be mailed directly to the patient.

RECOMMENDATION:
Screening mammogram in one year. (Code:[id number])

BI-RADS CATEGORY  1: Negative.

## 2019-01-27 ENCOUNTER — Other Ambulatory Visit: Payer: Self-pay | Admitting: Physician Assistant

## 2019-01-27 DIAGNOSIS — Z1231 Encounter for screening mammogram for malignant neoplasm of breast: Secondary | ICD-10-CM

## 2019-02-03 ENCOUNTER — Other Ambulatory Visit: Payer: Self-pay | Admitting: Physician Assistant

## 2019-02-03 DIAGNOSIS — N3010 Interstitial cystitis (chronic) without hematuria: Secondary | ICD-10-CM

## 2019-02-10 ENCOUNTER — Other Ambulatory Visit: Payer: Self-pay | Admitting: Physician Assistant

## 2019-02-10 DIAGNOSIS — I10 Essential (primary) hypertension: Secondary | ICD-10-CM

## 2019-02-10 DIAGNOSIS — K219 Gastro-esophageal reflux disease without esophagitis: Secondary | ICD-10-CM

## 2019-02-22 ENCOUNTER — Encounter: Payer: Self-pay | Admitting: Physician Assistant

## 2019-02-23 ENCOUNTER — Ambulatory Visit
Admission: RE | Admit: 2019-02-23 | Discharge: 2019-02-23 | Disposition: A | Discharge status: Home / Self Care | Payer: Medicare Other | Source: Ambulatory Visit | Attending: Physician Assistant | Admitting: Physician Assistant

## 2019-02-23 DIAGNOSIS — Z1231 Encounter for screening mammogram for malignant neoplasm of breast: Secondary | ICD-10-CM | POA: Insufficient documentation

## 2019-05-05 ENCOUNTER — Other Ambulatory Visit: Payer: Self-pay | Admitting: Physician Assistant

## 2019-05-05 DIAGNOSIS — N3010 Interstitial cystitis (chronic) without hematuria: Secondary | ICD-10-CM

## 2019-05-05 DIAGNOSIS — K219 Gastro-esophageal reflux disease without esophagitis: Secondary | ICD-10-CM

## 2019-05-05 DIAGNOSIS — I10 Essential (primary) hypertension: Secondary | ICD-10-CM

## 2019-05-05 NOTE — Telephone Encounter (Signed)
Call to patient- scheduled visit Monday am- medication follow up. Patient had COVID in 11/20- but has recovered and is doing well. Patient has enough medication to last until appointment- please hold request so she can get sufficient RF until next appointment.

## 2019-05-05 NOTE — Progress Notes (Signed)
Established patient visit     Patient: [name]   DOB: [birth year]   69 y.o. Female  MRN: [id number] Visit Date: 05/08/2019  Today's healthcare provider: Margaretann Loveless, PA-C  Subjective:    Chief Complaint  Patient presents with  . Follow-up    HTN   HPI Hypertension, follow-up  BP Readings from Last 3 Encounters:  05/08/19 138/80  12/23/18 139/77  12/19/18 (!) 157/89   Wt Readings from Last 3 Encounters:  05/08/19 133 lb 3.2 oz (60.4 kg)  12/19/18 132 lb (59.9 kg)  10/24/18 132 lb (59.9 kg)   She was last seen for hypertension 9 months ago.  BP at that visit was 132/84. Management since that visit includes none. She reports excellent compliance with treatment. She is not having side effects.  She is exercising. She is not adherent to low salt diet.   Outside blood pressures are not checking. Symptoms: No chest pain No chest pressure/discomfort No dyspnea (difficulty breathing) No lower extremity edema No orthopnea No palpitations No paroxysmal nocturnal dyspnea  No syncope  She does not smoke. She is following a Regular diet.  Last basic metabolic panel Lab Results  Component Value Date   GLUCOSE 81 08/10/2018   NA 135 08/10/2018   K 4.3 08/10/2018   CL 94 (L) 08/10/2018   CO2 24 08/10/2018   BUN 15 08/10/2018   CREATININE 0.71 08/10/2018   GFRNONAA 89 08/10/2018   GFRAA 103 08/10/2018   CALCIUM 9.5 08/10/2018   Last lipids Lab Results  Component Value Date   CHOL 187 08/10/2018   HDL 69 08/10/2018   LDLCALC 104 (H) 08/10/2018   TRIG 68 08/10/2018   CHOLHDL 2.7 08/10/2018   The 10-year ASCVD risk score Denman George DC Jr., et al., 2013) is: 9.6%   Lab Results  Component Value Date   NA 135 08/10/2018   K 4.3 08/10/2018   CL 94 (L) 08/10/2018   CO2 24 08/10/2018   GLUCOSE 81 08/10/2018   BUN 15 08/10/2018   CREATININE 0.71 08/10/2018   CALCIUM 9.5 08/10/2018   GFRNONAA 89 08/10/2018   GFRAA 103 08/10/2018    Lab Results    Component Value Date   CHOL 187 08/10/2018   HDL 69 08/10/2018   LDLCALC 104 (H) 08/10/2018   TRIG 68 08/10/2018   CHOLHDL 2.7 08/10/2018      Lab Results  Component Value Date   NA 135 08/10/2018   K 4.3 08/10/2018   CL 94 (L) 08/10/2018   CO2 24 08/10/2018   GLUCOSE 81 08/10/2018   BUN 15 08/10/2018   CREATININE 0.71 08/10/2018   CALCIUM 9.5 08/10/2018   GFRNONAA 89 08/10/2018   GFRAA 103 08/10/2018   Lab Results  Component Value Date   CHOL 187 08/10/2018   HDL 69 08/10/2018   LDLCALC 104 (H) 08/10/2018   TRIG 68 08/10/2018   CHOLHDL 2.7 08/10/2018     -----------------------------------------------------------------------------------------  Patient Active Problem List   Diagnosis Date Noted  . Hyperlipidemia   . Age-related osteoporosis without current pathological fracture 06/03/2017  . History of right cataract surgery 09/29/2016  . Chest pain on exertion 05/28/2016  . Right retinal detachment 05/01/2016  . Varicose veins of both lower extremities with inflammation 11/21/2015  . Chronic venous insufficiency 11/21/2015  . Pain in limb 11/21/2015  . GERD (gastroesophageal reflux disease) 11/23/2014  . Allergic rhinitis 07/17/2014  . History of tobacco use 02/26/2006  . Interstitial cystitis 02/22/2006  .  Abdominal hernia with gangrene and obstruction 02/22/2006  . Hypertension 04/22/1999   Past Medical History:  Diagnosis Date  . Complication of anesthesia   . GERD (gastroesophageal reflux disease)   . Hyperlipidemia   . Hypertension   . IC (interstitial cystitis)   . PONV (postoperative nausea and vomiting)    NO PROBLEM     4/18 WITH RETINAL SURGERY   Allergies  Allergen Reactions  . Doxycycline Nausea Only  . Penicillins Rash  . Pneumococcal 13-Val Conj Vacc Rash    Localized swelling and erythema of upper extremity; patient refuses pneumococcal 23 and wants documented she will not take due to this reaction Localized swelling and erythema of  upper extremity; patient refuses pneumococcal 23 and wants documented she will not take due to this reaction  . Sulfa Antibiotics Rash       Medications: Outpatient Medications Prior to Visit  Medication Sig  . amitriptyline (ELAVIL) 50 MG tablet Take 1 tablet (50 mg total) by mouth at bedtime.  . Calcium Carbonate-Vitamin D 600-200 MG-UNIT CAPS Take 2 tablets by mouth daily.  . Calcium Glycerophosphate (PRELIEF PO) Take 2 tablets by mouth daily. As needed   . clobetasol ointment (TEMOVATE) 0.05 % Apply to affected area every night for 4 weeks, then every other day for 4 weeks and then twice a week for4 weeks or until resolution.  . fluticasone (FLONASE) 50 MCG/ACT nasal spray Place 2 sprays into both nostrils daily.  Marland Kitchen MYRBETRIQ 50 MG TB24 tablet   . pantoprazole (PROTONIX) 40 MG tablet Take 1 tablet (40 mg total) by mouth daily.  Marland Kitchen triamterene-hydrochlorothiazide (MAXZIDE-25) 37.5-25 MG tablet Take 1 tablet by mouth daily.  . vitamin C (ASCORBIC ACID) 500 MG tablet Take 500 mg by mouth daily.  . [DISCONTINUED] predniSONE (STERAPRED UNI-PAK 21 TAB) 10 MG (21) TBPK tablet 6 day taper; take as directed on package instructions   No facility-administered medications prior to visit.    Review of Systems  Last CBC Lab Results  Component Value Date   WBC 7.7 08/10/2018   HGB 13.8 08/10/2018   HCT 39.4 08/10/2018   MCV 89 08/10/2018   MCH 31.0 08/10/2018   RDW 12.0 08/10/2018   PLT 244 97/67/3419   Last metabolic panel Lab Results  Component Value Date   GLUCOSE 81 08/10/2018   NA 135 08/10/2018   K 4.3 08/10/2018   CL 94 (L) 08/10/2018   CO2 24 08/10/2018   BUN 15 08/10/2018   CREATININE 0.71 08/10/2018   GFRNONAA 89 08/10/2018   GFRAA 103 08/10/2018   CALCIUM 9.5 08/10/2018   PROT 7.3 08/10/2018   ALBUMIN 4.6 08/10/2018   LABGLOB 2.7 08/10/2018   AGRATIO 1.7 08/10/2018   BILITOT 0.3 08/10/2018   ALKPHOS 71 08/10/2018   AST 18 08/10/2018   ALT 15 08/10/2018          Objective:    BP 138/80 (BP Location: Left Arm, Patient Position: Sitting, Cuff Size: Normal)   Pulse 78   Temp (!) 97.2 F (36.2 C) (Temporal)   Resp 16   Wt 133 lb 3.2 oz (60.4 kg)   BMI 21.50 kg/m  BP Readings from Last 3 Encounters:  05/08/19 138/80  12/23/18 139/77  12/19/18 (!) 157/89   Wt Readings from Last 3 Encounters:  05/08/19 133 lb 3.2 oz (60.4 kg)  12/19/18 132 lb (59.9 kg)  10/24/18 132 lb (59.9 kg)      Physical Exam Vitals reviewed.  Constitutional:  General: She is not in acute distress.    Appearance: Normal appearance. She is well-developed and normal weight. She is not ill-appearing or diaphoretic.  Cardiovascular:     Rate and Rhythm: Normal rate and regular rhythm.     Pulses: Normal pulses.     Heart sounds: Normal heart sounds. No murmur. No friction rub. No gallop.   Pulmonary:     Effort: Pulmonary effort is normal. No respiratory distress.     Breath sounds: Normal breath sounds. No wheezing or rales.  Musculoskeletal:     Cervical back: Normal range of motion and neck supple.  Neurological:     Mental Status: She is alert.  Psychiatric:        Mood and Affect: Mood normal.        Thought Content: Thought content normal.       No results found for any visits on 05/08/19.    Assessment & Plan:    1. Essential hypertension Stable. Continue Maxzide 37.5-25mg  daily. Return in July 2021 for AWV.    No follow-ups on file.     Delmer Islam, PA-C, have reviewed all documentation for this visit. The documentation on 05/08/19 for the exam, diagnosis, procedures, and orders are all accurate and complete.   Reine Just  [facility] [phone] (phone) [phone] (fax)  [facility]

## 2019-05-08 ENCOUNTER — Encounter: Payer: Self-pay | Admitting: Physician Assistant

## 2019-05-08 ENCOUNTER — Other Ambulatory Visit: Payer: Self-pay

## 2019-05-08 ENCOUNTER — Ambulatory Visit (INDEPENDENT_AMBULATORY_CARE_PROVIDER_SITE_OTHER): Payer: Medicare Other | Admitting: Physician Assistant

## 2019-05-08 VITALS — BP 138/80 | HR 78 | Temp 97.2°F | Resp 16 | Wt 133.2 lb

## 2019-05-08 DIAGNOSIS — I10 Essential (primary) hypertension: Secondary | ICD-10-CM

## 2019-05-08 NOTE — Patient Instructions (Signed)
DASH Eating Plan DASH stands for "Dietary Approaches to Stop Hypertension." The DASH eating plan is a healthy eating plan that has been shown to reduce high blood pressure (hypertension). It may also reduce your risk for type 2 diabetes, heart disease, and stroke. The DASH eating plan may also help with weight loss. What are tips for following this plan?  General guidelines  Avoid eating more than 2,300 mg (milligrams) of salt (sodium) a day. If you have hypertension, you may need to reduce your sodium intake to 1,500 mg a day.  Limit alcohol intake to no more than 1 drink a day for nonpregnant women and 2 drinks a day for men. One drink equals 12 oz of beer, 5 oz of wine, or 1 oz of hard liquor.  Work with your health care provider to maintain a healthy body weight or to lose weight. Ask what an ideal weight is for you.  Get at least 30 minutes of exercise that causes your heart to beat faster (aerobic exercise) most days of the week. Activities may include walking, swimming, or biking.  Work with your health care provider or diet and nutrition specialist (dietitian) to adjust your eating plan to your individual calorie needs. Reading food labels   Check food labels for the amount of sodium per serving. Choose foods with less than 5 percent of the Daily Value of sodium. Generally, foods with less than 300 mg of sodium per serving fit into this eating plan.  To find whole grains, look for the word "whole" as the first word in the ingredient list. Shopping  Buy products labeled as "low-sodium" or "no salt added."  Buy fresh foods. Avoid canned foods and premade or frozen meals. Cooking  Avoid adding salt when cooking. Use salt-free seasonings or herbs instead of table salt or sea salt. Check with your health care provider or pharmacist before using salt substitutes.  Do not fry foods. Cook foods using healthy methods such as baking, boiling, grilling, and broiling instead.  Cook with  heart-healthy oils, such as olive, canola, soybean, or sunflower oil. Meal planning  Eat a balanced diet that includes: ? 5 or more servings of fruits and vegetables each day. At each meal, try to fill half of your plate with fruits and vegetables. ? Up to 6-8 servings of whole grains each day. ? Less than 6 oz of lean meat, poultry, or fish each day. A 3-oz serving of meat is about the same size as a deck of cards. One egg equals 1 oz. ? 2 servings of low-fat dairy each day. ? A serving of nuts, seeds, or beans 5 times each week. ? Heart-healthy fats. Healthy fats called Omega-3 fatty acids are found in foods such as flaxseeds and coldwater fish, like sardines, salmon, and mackerel.  Limit how much you eat of the following: ? Canned or prepackaged foods. ? Food that is high in trans fat, such as fried foods. ? Food that is high in saturated fat, such as fatty meat. ? Sweets, desserts, sugary drinks, and other foods with added sugar. ? Full-fat dairy products.  Do not salt foods before eating.  Try to eat at least 2 vegetarian meals each week.  Eat more home-cooked food and less restaurant, buffet, and fast food.  When eating at a restaurant, ask that your food be prepared with less salt or no salt, if possible. What foods are recommended? The items listed may not be a complete list. Talk with your dietitian about   what dietary choices are best for you. Grains Whole-grain or whole-wheat bread. Whole-grain or whole-wheat pasta. Brown rice. Oatmeal. Quinoa. Bulgur. Whole-grain and low-sodium cereals. Pita bread. Low-fat, low-sodium crackers. Whole-wheat flour tortillas. Vegetables Fresh or frozen vegetables (raw, steamed, roasted, or grilled). Low-sodium or reduced-sodium tomato and vegetable juice. Low-sodium or reduced-sodium tomato sauce and tomato paste. Low-sodium or reduced-sodium canned vegetables. Fruits All fresh, dried, or frozen fruit. Canned fruit in natural juice (without  added sugar). Meat and other protein foods Skinless chicken or turkey. Ground chicken or turkey. Pork with fat trimmed off. Fish and seafood. Egg whites. Dried beans, peas, or lentils. Unsalted nuts, nut butters, and seeds. Unsalted canned beans. Lean cuts of beef with fat trimmed off. Low-sodium, lean deli meat. Dairy Low-fat (1%) or fat-free (skim) milk. Fat-free, low-fat, or reduced-fat cheeses. Nonfat, low-sodium ricotta or cottage cheese. Low-fat or nonfat yogurt. Low-fat, low-sodium cheese. Fats and oils Soft margarine without trans fats. Vegetable oil. Low-fat, reduced-fat, or light mayonnaise and salad dressings (reduced-sodium). Canola, safflower, olive, soybean, and sunflower oils. Avocado. Seasoning and other foods Herbs. Spices. Seasoning mixes without salt. Unsalted popcorn and pretzels. Fat-free sweets. What foods are not recommended? The items listed may not be a complete list. Talk with your dietitian about what dietary choices are best for you. Grains Baked goods made with fat, such as croissants, muffins, or some breads. Dry pasta or rice meal packs. Vegetables Creamed or fried vegetables. Vegetables in a cheese sauce. Regular canned vegetables (not low-sodium or reduced-sodium). Regular canned tomato sauce and paste (not low-sodium or reduced-sodium). Regular tomato and vegetable juice (not low-sodium or reduced-sodium). Pickles. Olives. Fruits Canned fruit in a light or heavy syrup. Fried fruit. Fruit in cream or butter sauce. Meat and other protein foods Fatty cuts of meat. Ribs. Fried meat. Bacon. Sausage. Bologna and other processed lunch meats. Salami. Fatback. Hotdogs. Bratwurst. Salted nuts and seeds. Canned beans with added salt. Canned or smoked fish. Whole eggs or egg yolks. Chicken or turkey with skin. Dairy Whole or 2% milk, cream, and half-and-half. Whole or full-fat cream cheese. Whole-fat or sweetened yogurt. Full-fat cheese. Nondairy creamers. Whipped toppings.  Processed cheese and cheese spreads. Fats and oils Butter. Stick margarine. Lard. Shortening. Ghee. Bacon fat. Tropical oils, such as coconut, palm kernel, or palm oil. Seasoning and other foods Salted popcorn and pretzels. Onion salt, garlic salt, seasoned salt, table salt, and sea salt. Worcestershire sauce. Tartar sauce. Barbecue sauce. Teriyaki sauce. Soy sauce, including reduced-sodium. Steak sauce. Canned and packaged gravies. Fish sauce. Oyster sauce. Cocktail sauce. Horseradish that you find on the shelf. Ketchup. Mustard. Meat flavorings and tenderizers. Bouillon cubes. Hot sauce and Tabasco sauce. Premade or packaged marinades. Premade or packaged taco seasonings. Relishes. Regular salad dressings. Where to find more information:  National Heart, Lung, and Blood Institute: www.nhlbi.nih.gov  American Heart Association: www.heart.org Summary  The DASH eating plan is a healthy eating plan that has been shown to reduce high blood pressure (hypertension). It may also reduce your risk for type 2 diabetes, heart disease, and stroke.  With the DASH eating plan, you should limit salt (sodium) intake to 2,300 mg a day. If you have hypertension, you may need to reduce your sodium intake to 1,500 mg a day.  When on the DASH eating plan, aim to eat more fresh fruits and vegetables, whole grains, lean proteins, low-fat dairy, and heart-healthy fats.  Work with your health care provider or diet and nutrition specialist (dietitian) to adjust your eating plan to your   individual calorie needs. This information is not intended to replace advice given to you by your health care provider. Make sure you discuss any questions you have with your health care provider. Document Revised: 12/18/2016 Document Reviewed: 12/30/2015 Elsevier Patient Education  2020 [employer]  

## 2019-05-15 ENCOUNTER — Other Ambulatory Visit: Payer: Self-pay

## 2019-05-15 ENCOUNTER — Ambulatory Visit
Admission: EM | Admit: 2019-05-15 | Discharge: 2019-05-15 | Disposition: A | Discharge status: Home / Self Care | Payer: Medicare Other

## 2019-05-15 DIAGNOSIS — R110 Nausea: Secondary | ICD-10-CM | POA: Diagnosis not present

## 2019-05-15 DIAGNOSIS — S81801A Unspecified open wound, right lower leg, initial encounter: Secondary | ICD-10-CM

## 2019-05-15 DIAGNOSIS — L03115 Cellulitis of right lower limb: Secondary | ICD-10-CM | POA: Diagnosis not present

## 2019-05-15 MED ORDER — DOXYCYCLINE HYCLATE 100 MG PO CAPS: 100.0000 mg | ORAL_CAPSULE | Freq: Two times a day (BID) | ORAL | 0 refills | Status: DC

## 2019-05-15 MED ORDER — ONDANSETRON HCL 4 MG PO TABS: 4.0000 mg | ORAL_TABLET | Freq: Four times a day (QID) | ORAL | 0 refills | Status: DC | PRN

## 2019-05-15 NOTE — Discharge Instructions (Addendum)
Take the antibiotic as directed.  Take the antinausea medication as needed.    Keep your wound clean and dry.  Wash it gently twice a day with soap and water.  Apply an antibiotic cream and bandage twice a day.    Follow-up with your primary care provider or return here if you see signs of worsening infection, such as increased pain, redness, pus-like drainage, warmth, fever, chills, or other concerning symptoms.    Your blood pressure is elevated today at 164/94.  Please have this rechecked by your primary care provider in 2-4 weeks.

## 2019-05-15 NOTE — ED Triage Notes (Addendum)
Pt is here after hitting her right lower leg into the dishwasher. Today is the first day she has been seen for this wound, pt has used Neosprin, Advil to relieve discomfort

## 2019-05-15 NOTE — ED Provider Notes (Signed)
[name]    CSN: [id number] Arrival date & time: 05/15/19  1304      History   Chief Complaint Chief Complaint  Patient presents with  . Leg Injury    HPI [name] is a 69 y.o. female.   Patient presents with a wound on her right lower leg x6 days.  She states she hit it on her dishwasher.  She has been treating it at home with Neosporin and Advil.  She states the wound is painful, red, and swollen.  She denies fever, chills, weakness, numbness, red streaks, drainage, or other symptoms.    The history is provided by the patient.    Past Medical History:  Diagnosis Date  . Complication of anesthesia   . GERD (gastroesophageal reflux disease)   . Hyperlipidemia   . Hypertension   . IC (interstitial cystitis)   . PONV (postoperative nausea and vomiting)    NO PROBLEM     4/18 WITH RETINAL SURGERY    Patient Active Problem List   Diagnosis Date Noted  . Hyperlipidemia   . Age-related osteoporosis without current pathological fracture 06/03/2017  . History of right cataract surgery 09/29/2016  . Chest pain on exertion 05/28/2016  . Right retinal detachment 05/01/2016  . Varicose veins of both lower extremities with inflammation 11/21/2015  . Chronic venous insufficiency 11/21/2015  . Pain in limb 11/21/2015  . GERD (gastroesophageal reflux disease) 11/23/2014  . Allergic rhinitis 07/17/2014  . History of tobacco use 02/26/2006  . Interstitial cystitis 02/22/2006  . Abdominal hernia with gangrene and obstruction 02/22/2006  . Hypertension 04/22/1999    Past Surgical History:  Procedure Laterality Date  . BLADDER SUSPENSION     TRANSVAGINAL SLING  . BUNIONECTOMY  12/02/2017  . CATARACT EXTRACTION W/PHACO Right 09/29/2016   Procedure: CATARACT EXTRACTION PHACO AND INTRAOCULAR LENS PLACEMENT (IOC);  Surgeon: Birder Robson, MD;  Location: [facility] ORS;  Service: Ophthalmology;  Laterality: Right;  [place] 00:47 AP% 21.6 CDE 10.26 fluid pack lot # [id number]    . CHOLECYSTECTOMY    . EYE SURGERY    . INSERTION OF MESH  2008   Trans vaginal mesh  . LASER ABLATION Right 11/21/2015   [facility] Dr. Delana Meyer    OB History    Gravida  2   Para  2   Term  2   Preterm      AB      Living  2     SAB      TAB      Ectopic      Multiple      Live Births  2            Home Medications    Prior to Admission medications   Medication Sig Start Date End Date Taking? Authorizing Provider  aspirin 325 MG EC tablet Take by mouth. 12/01/17  Yes [provider]  Cholecalciferol 125 MCG (5000 UT) TABS Take by mouth. 12/01/17  Yes [provider]  amitriptyline (ELAVIL) 50 MG tablet Take 1 tablet (50 mg total) by mouth at bedtime. 05/05/19   Mar Daring, PA-C  Calcium Carbonate-Vitamin D 600-200 MG-UNIT CAPS Take 2 tablets by mouth daily. 07/15/07   [provider]  Calcium Glycerophosphate (PRELIEF PO) Take 2 tablets by mouth daily. As needed     [provider]  clobetasol ointment (TEMOVATE) 0.05 % Apply to affected area every night for 4 weeks, then every other day for  4 weeks and then twice a week for4 weeks or until resolution. 08/19/18   Schuman, Jaquelyn Bitter, MD  doxycycline (VIBRAMYCIN) 100 MG capsule Take 1 capsule (100 mg total) by mouth 2 (two) times daily. 05/15/19   Mickie Bail, NP  fluticasone (FLONASE) 50 MCG/ACT nasal spray Place 2 sprays into both nostrils daily. 07/02/16   Margaretann Loveless, PA-C  MYRBETRIQ 50 MG TB24 tablet  06/03/17   [provider]  ondansetron (ZOFRAN) 4 MG tablet Take 1 tablet (4 mg total) by mouth every 6 (six) hours as needed for nausea or vomiting. 05/15/19   Mickie Bail, NP  pantoprazole (PROTONIX) 40 MG tablet Take 1 tablet (40 mg total) by mouth daily. 05/05/19   Margaretann Loveless, PA-C  triamterene-hydrochlorothiazide (MAXZIDE-25) 37.5-25 MG tablet Take 1 tablet by mouth daily. 05/05/19   Margaretann Loveless, PA-C   vitamin C (ASCORBIC ACID) 500 MG tablet Take 500 mg by mouth daily.    [provider]    Family History Family History  Problem Relation Age of Onset  . Cancer Father        Bladder  . Bladder Cancer Father   . Hypertension Father   . Coronary artery disease Father        Stent Placed 1990  . Liver disease Father   . Congestive Heart Failure Mother   . Healthy Sister   . Hypertension Sister   . Breast cancer Paternal Aunt   . Healthy Son   . Healthy Son     Social History Social History   Tobacco Use  . Smoking status: Former Smoker    Packs/day: 1.00    Years: 35.00    Pack years: 35.00    Quit date: 01/20/1996    Years since quitting: 23.3  . Smokeless tobacco: Never Used  Substance Use Topics  . Alcohol use: No  . Drug use: No     Allergies   Doxycycline, Penicillins, Pneumococcal 13-val conj vacc, and Sulfa antibiotics   Review of Systems Review of Systems  Constitutional: Negative for chills and fever.  HENT: Negative for ear pain and sore throat.   Eyes: Negative for pain and visual disturbance.  Respiratory: Negative for cough and shortness of breath.   Cardiovascular: Negative for chest pain and palpitations.  Gastrointestinal: Negative for abdominal pain and vomiting.  Genitourinary: Negative for dysuria and hematuria.  Musculoskeletal: Negative for arthralgias and back pain.  Skin: Positive for wound. Negative for color change and rash.  Neurological: Negative for seizures, syncope, weakness and numbness.  All other systems reviewed and are negative.    Physical Exam Triage Vital Signs ED Triage Vitals  Enc Vitals Group     BP      Pulse      Resp      Temp      Temp src      SpO2      Weight      Height      Head Circumference      Peak Flow      Pain Score      Pain Loc      Pain Edu?      Excl. in GC?    No data found.  Updated Vital Signs BP (!) 164/94 (BP Location: Left Arm)   Pulse 78   Temp 98.1 F (36.7 C)  (Oral)   Resp 15   SpO2 98%   Visual Acuity Right Eye Distance:  Left Eye Distance:   Bilateral Distance:    Right Eye Near:   Left Eye Near:    Bilateral Near:     Physical Exam Vitals and nursing note reviewed.  Constitutional:      General: She is not in acute distress.    Appearance: She is well-developed.  HENT:     Head: Normocephalic and atraumatic.     Mouth/Throat:     Mouth: Mucous membranes are moist.  Eyes:     Conjunctiva/sclera: Conjunctivae normal.  Cardiovascular:     Rate and Rhythm: Normal rate and regular rhythm.     Heart sounds: No murmur.  Pulmonary:     Effort: Pulmonary effort is normal. No respiratory distress.     Breath sounds: Normal breath sounds.  Abdominal:     Palpations: Abdomen is soft.     Tenderness: There is no abdominal tenderness.  Musculoskeletal:     Cervical back: Neck supple.  Skin:    General: Skin is warm and dry.     Findings: Lesion present.     Comments: Wound and cellulitis on right lower leg.  See pictures for details.    Neurological:     General: No focal deficit present.     Mental Status: She is alert and oriented to person, place, and time.     Sensory: No sensory deficit.     Motor: No weakness.     Gait: Gait normal.  Psychiatric:        Mood and Affect: Mood normal.        Behavior: Behavior normal.      UC Treatments / Results  Labs (all labs ordered are listed, but only abnormal results are displayed) Labs Reviewed - No data to display  EKG         Radiology No results found.  Procedures Procedures (including critical care time)  Medications Ordered in UC Medications - No data to display  Initial Impression / Assessment and Plan / UC Course  I have reviewed the triage vital signs and the nursing notes.  Pertinent labs & imaging results that were available during my care of the patient were reviewed by me and considered in my medical decision making (see chart for details).    Open wound on right lower leg with cellulitis of right lower leg;  Nausea.  Treating with doxycycline; patient has listed allergy for this medication but reports only nausea when taken previously; Zofran provided.  Wound care instructions and signs of worsening infection discussed with patient.  Referral to wound care clinic provided; wound care clinic will call patient for appointment scheduling.  Instructed patient to go to the ED if she has increased redness or develops red streaks or fever or other concerning symptoms.  Patient agrees to plan of care.      Final Clinical Impressions(s) / UC Diagnoses   Final diagnoses:  Cellulitis of leg, right  Open wound of right lower leg, initial encounter  Nausea     Discharge Instructions     Take the antibiotic as directed.  Take the antinausea medication as needed.    Keep your wound clean and dry.  Wash it gently twice a day with soap and water.  Apply an antibiotic cream and bandage twice a day.    Follow-up with your primary care provider or return here if you see signs of worsening infection, such as increased pain, redness, pus-like drainage, warmth, fever, chills, or other concerning symptoms.  Your blood pressure is elevated today at 164/94.  Please have this rechecked by your primary care provider in 2-4 weeks.         ED Prescriptions    Medication Sig Dispense Auth. Provider   doxycycline (VIBRAMYCIN) 100 MG capsule Take 1 capsule (100 mg total) by mouth 2 (two) times daily. 20 capsule Mickie Bail, NP   ondansetron (ZOFRAN) 4 MG tablet Take 1 tablet (4 mg total) by mouth every 6 (six) hours as needed for nausea or vomiting. 12 tablet Mickie Bail, NP     PDMP not reviewed this encounter.   Mickie Bail, NP 05/15/19 [id number]

## 2019-05-16 ENCOUNTER — Ambulatory Visit: Payer: Medicare Other | Admitting: Physician Assistant

## 2019-05-18 ENCOUNTER — Encounter: Payer: Medicare Other | Attending: Physician Assistant | Admitting: Physician Assistant

## 2019-05-18 ENCOUNTER — Other Ambulatory Visit: Payer: Self-pay

## 2019-05-18 DIAGNOSIS — E785 Hyperlipidemia, unspecified: Secondary | ICD-10-CM | POA: Diagnosis not present

## 2019-05-18 DIAGNOSIS — I10 Essential (primary) hypertension: Secondary | ICD-10-CM | POA: Insufficient documentation

## 2019-05-18 DIAGNOSIS — Z880 Allergy status to penicillin: Secondary | ICD-10-CM | POA: Insufficient documentation

## 2019-05-18 DIAGNOSIS — Z87891 Personal history of nicotine dependence: Secondary | ICD-10-CM | POA: Diagnosis not present

## 2019-05-18 DIAGNOSIS — X58XXXA Exposure to other specified factors, initial encounter: Secondary | ICD-10-CM | POA: Diagnosis not present

## 2019-05-18 DIAGNOSIS — Z882 Allergy status to sulfonamides status: Secondary | ICD-10-CM | POA: Diagnosis not present

## 2019-05-18 DIAGNOSIS — L97812 Non-pressure chronic ulcer of other part of right lower leg with fat layer exposed: Secondary | ICD-10-CM | POA: Diagnosis present

## 2019-05-18 DIAGNOSIS — S81801A Unspecified open wound, right lower leg, initial encounter: Secondary | ICD-10-CM | POA: Insufficient documentation

## 2019-05-18 DIAGNOSIS — Z8249 Family history of ischemic heart disease and other diseases of the circulatory system: Secondary | ICD-10-CM | POA: Diagnosis not present

## 2019-05-18 DIAGNOSIS — L03115 Cellulitis of right lower limb: Secondary | ICD-10-CM | POA: Insufficient documentation

## 2019-05-19 NOTE — Progress Notes (Signed)
MARLINDA, MIRANDA ([id number]) Visit Report for 05/18/2019 Allergy List Details Patient Name: [name], [name]. Date of Service: 05/18/2019 9:45 AM Medical Record Number: [id number] Patient Account Number: [id number] Date of Birth/Sex: 1950-12-29 (68 y.o. F) Treating RN: Curtis Sites Primary Care Naylee Frankowski: Joycelyn Man Other Clinician: Referring Rayce Brahmbhatt: TATE, Tresa Endo Treating Anthany Thornhill/Extender: STONE III, HOYT Weeks in Treatment: 0 Allergies Active Allergies penicillin Reaction: rash Sulfa (Sulfonamide Antibiotics) Reaction: rash Allergy Notes Electronic Signature(s) Signed: 05/18/2019 4:42:54 PM By: Curtis Sites Entered By: Curtis Sites on 05/18/2019 10:26:54 [name] ([id number]) -------------------------------------------------------------------------------- Arrival Information Details Patient Name: [name]. Date of Service: 05/18/2019 9:45 AM Medical Record Number: [id number] Patient Account Number: [id number] Date of Birth/Sex: 04-05-1950 (68 y.o. F) Treating RN: Curtis Sites Primary Care Tyaira Heward: Joycelyn Man Other Clinician: Referring Deandre Brannan: Wendee Beavers Treating Renny Gunnarson/Extender: Linwood Dibbles, HOYT Weeks in Treatment: 0 Visit Information Patient Arrived: Ambulatory Arrival Time: 10:25 Accompanied By: self Transfer Assistance: None Patient Identification Verified: Yes Secondary Verification Process Completed: Yes Electronic Signature(s) Signed: 05/18/2019 4:42:54 PM By: Curtis Sites Entered By: Curtis Sites on 05/18/2019 10:25:57 [name] ([id number]) -------------------------------------------------------------------------------- Clinic Level of Care Assessment Details Patient Name: [name]. Date of Service: 05/18/2019 9:45 AM Medical Record Number: [id number] Patient Account Number: [id number] Date of Birth/Sex: 30-Aug-1950 (68 y.o. F) Treating RN: Rodell Perna Primary Care Justan Gaede: Joycelyn Man Other Clinician: Referring  Kshawn Canal: TATE, Tresa Endo Treating Eudell Mcphee/Extender: Linwood Dibbles, HOYT Weeks in Treatment: 0 Clinic Level of Care Assessment Items TOOL 1 Quantity Score []  - Use when EandM and Procedure is performed on INITIAL visit 0 ASSESSMENTS - Nursing Assessment / Reassessment X - General Physical Exam (combine w/ comprehensive assessment (listed just below) when performed on new 1 20 pt. evals) X- 1 25 Comprehensive Assessment (HX, ROS, Risk Assessments, Wounds Hx, etc.) ASSESSMENTS - Wound and Skin Assessment / Reassessment []  - Dermatologic / Skin Assessment (not related to wound area) 0 ASSESSMENTS - Ostomy and/or Continence Assessment and Care []  - Incontinence Assessment and Management 0 []  - 0 Ostomy Care Assessment and Management (repouching, etc.) PROCESS - Coordination of Care X - Simple Patient / Family Education for ongoing care 1 15 []  - 0 Complex (extensive) Patient / Family Education for ongoing care X- 1 10 Staff obtains , Records, Test Results / Process Orders []  - 0 Staff telephones HHA, Nursing Homes / Clarify orders / etc []  - 0 Routine Transfer to another Facility (non-emergent condition) []  - 0 Routine Hospital Admission (non-emergent condition) X- 1 15 New Admissions / / Ordering NPWT, Apligraf, etc. []  - 0 Emergency Hospital Admission (emergent condition) PROCESS - Special Needs []  - Pediatric / Minor Patient Management 0 []  - 0 Isolation Patient Management []  - 0 Hearing / Language / Visual special needs []  - 0 Assessment of Community assistance (transportation, D/C planning, etc.) []  - 0 Additional assistance / Altered mentation []  - 0 Support Surface(s) Assessment (bed, cushion, seat, etc.) INTERVENTIONS - Miscellaneous []  - External ear exam 0 []  - 0 Patient Transfer (multiple staff / / Similar devices) []  - 0 Simple Staple / Suture removal (25 or less) []  - 0 Complex Staple / Suture removal (26 or more) []   - 0 Hypo/Hyperglycemic Management (do not check if billed separately) []  - 0 Ankle / Brachial Index (ABI) - do not check if billed separately Has the patient been seen at the hospital within the last three years: Yes Total Score: 85 Level Of Care: New/Established - Level 3  [name], [name] ([id number]) Electronic Signature(s) Signed: 05/18/2019 12:47:05 PM By: Army Melia Entered By: Army Melia on 05/18/2019 11:00:53 [name], [name] ([id number]) -------------------------------------------------------------------------------- Encounter Discharge Information Details Patient Name: [name], [name]. Date of Service: 05/18/2019 9:45 AM Medical Record Number: [id number] Patient Account Number: [id number] Date of Birth/Sex: May 22, 1950 (68 y.o. F) Treating RN: Army Melia Primary Care Mailee Klaas: Fenton Malling Other Clinician: Referring Ismeal Heider: TATE, Claiborne Billings Treating Mana Haberl/Extender: Melburn Hake, HOYT Weeks in Treatment: 0 Encounter Discharge Information Items Post Procedure Vitals Discharge Condition: Stable Temperature (F): 98.4 Ambulatory Status: Ambulatory Pulse (bpm): 76 Discharge Destination: Home Respiratory Rate (breaths/min): 16 Transportation: Private Auto Blood Pressure (mmHg): 153/83 Accompanied By: self Schedule Follow-up Appointment: Yes Clinical Summary of Care: Electronic Signature(s) Signed: 05/18/2019 12:47:05 PM By: Army Melia Entered By: Army Melia on 05/18/2019 11:02:30 Scotti, Georgia Duff ([id number]) -------------------------------------------------------------------------------- Lower Extremity Assessment Details Patient Name: [name]. Date of Service: 05/18/2019 9:45 AM Medical Record Number: [id number] Patient Account Number: [id number] Date of Birth/Sex: [birth year] (68 y.o. F) Treating RN: Montey Hora Primary Care Notnamed Scholz: Fenton Malling Other Clinician: Referring Aydan Levitz: TATE, Claiborne Billings Treating Linsay Vogt/Extender: Melburn Hake, HOYT Weeks in  Treatment: 0 Edema Assessment Assessed: [Left: No] [Right: No] Edema: [Left: N] [Right: o] Calf Left: Right: Point of Measurement: 30 cm From Medial Instep cm 32.5 cm Ankle Left: Right: Point of Measurement: 10 cm From Medial Instep cm 21 cm Vascular Assessment Pulses: Dorsalis Pedis Palpable: [Right:Yes] Doppler Audible: [Right:Yes] Posterior Tibial Palpable: [Right:Yes Yes] Electronic Signature(s) Signed: 05/18/2019 4:42:54 PM By: Montey Hora Entered By: Montey Hora on 05/18/2019 10:41:52 Ureste, Laura M. ([id number]) -------------------------------------------------------------------------------- Multi Wound Chart Details Patient Name: [name]. Date of Service: 05/18/2019 9:45 AM Medical Record Number: [id number] Patient Account Number: [id number] Date of Birth/Sex: [birth year] (68 y.o. F) Treating RN: Army Melia Primary Care Mertie Haslem: Fenton Malling Other Clinician: Referring Tye Juarez: TATE, Claiborne Billings Treating Matilyn Fehrman/Extender: Melburn Hake, HOYT Weeks in Treatment: 0 Vital Signs Height(in): 66 Pulse(bpm): 76 Weight(lbs): 130 Blood Pressure(mmHg): 153/83 Body Mass Index(BMI): 21 Temperature(F): 98.4 Respiratory Rate(breaths/min): 16 Photos: [N/A:N/A] Wound Location: Right, Lateral Lower Leg N/A N/A Wounding Event: Trauma N/A N/A Primary Etiology: Trauma, Other N/A N/A Comorbid History: Hypertension N/A N/A Date Acquired: 05/10/2019 N/A N/A Weeks of Treatment: 0 N/A N/A Wound Status: Open N/A N/A Measurements L x W x D (cm) 3.4x3x0.3 N/A N/A Area (cm) : 8.011 N/A N/A Volume (cm) : 2.403 N/A N/A Classification: Full Thickness Without Exposed N/A N/A Support Structures Exudate Amount: Medium N/A N/A Exudate Type: Serous N/A N/A Exudate Color: amber N/A N/A Wound Margin: Flat and Intact N/A N/A Granulation Amount: Small (1-33%) N/A N/A Granulation Quality: Pink N/A N/A Necrotic Amount: Large (67-100%) N/A N/A Exposed Structures: Fat Layer  (Subcutaneous Tissue) N/A N/A Exposed: Yes Fascia: No Tendon: No Muscle: No Joint: No Bone: No Epithelialization: None N/A N/A Treatment Notes Electronic Signature(s) Signed: 05/18/2019 12:47:05 PM By: Army Melia Entered By: Army Melia on 05/18/2019 10:56:49 [name] ([id number]) -------------------------------------------------------------------------------- [place] Details Patient Name: [name]. Date of Service: 05/18/2019 9:45 AM Medical Record Number: [id number] Patient Account Number: [id number] Date of Birth/Sex: 1950-07-22 (68 y.o. F) Treating RN: Army Melia Primary Care Vaun Hyndman: Fenton Malling Other Clinician: Referring Kallyn Demarcus: Barkley Boards Treating Ainara Eldridge/Extender: Melburn Hake, HOYT Weeks in Treatment: 0 Active Inactive Orientation to the Wound Care Program Nursing Diagnoses: Knowledge deficit related to the wound healing center program Goals: Patient/caregiver will verbalize understanding of the [place] Date Initiated: 05/18/2019 Target Resolution Date: 05/26/2019  Goal Status: Active Interventions: Provide education on orientation to the wound center Notes: Wound/Skin Impairment Nursing Diagnoses: Impaired tissue integrity Goals: Ulcer/skin breakdown will have a volume reduction of 30% by week 4 Date Initiated: 05/18/2019 Target Resolution Date: 06/15/2019 Goal Status: Active Interventions: Assess ulceration(s) every visit Notes: Electronic Signature(s) Signed: 05/18/2019 12:47:05 PM By: Rodell Perna Entered By: Rodell Perna on 05/18/2019 10:56:34 [name], Laura M. ([id number]) -------------------------------------------------------------------------------- Pain Assessment Details Patient Name: [name]. Date of Service: 05/18/2019 9:45 AM Medical Record Number: [id number] Patient Account Number: [id number] Date of Birth/Sex: [birth year] (68 y.o. F) Treating RN: Curtis Sites Primary Care  Murel Shenberger: Joycelyn Man Other Clinician: Referring Chany Woolworth: TATE, Tresa Endo Treating Kensie Susman/Extender: Linwood Dibbles, HOYT Weeks in Treatment: 0 Active Problems Location of Pain Severity and Description of Pain Patient Has Paino Yes Site Locations Pain Location: Pain in Ulcers With Dressing Change: Yes Duration of the Pain. Constant / Intermittento Constant Pain Management and Medication Current Pain Management: Notes "hurts a little bit" Electronic Signature(s) Signed: 05/18/2019 4:42:54 PM By: Curtis Sites Entered By: Curtis Sites on 05/18/2019 10:26:21 [name] ([id number]) -------------------------------------------------------------------------------- Patient/Caregiver Education Details Patient Name: [name]. Date of Service: 05/18/2019 9:45 AM Medical Record Number: [id number] Patient Account Number: [id number] Date of Birth/Gender: 1950-11-11 (69 y.o. F) Treating RN: Rodell Perna Primary Care Physician: Joycelyn Man Other Clinician: Referring Physician: Wendee Beavers Treating Physician/Extender: Skeet Simmer in Treatment: 0 Education Assessment Education Provided To: Patient Education Topics Provided Wound/Skin Impairment: Handouts: Caring for Your Ulcer Methods: Demonstration, Explain/Verbal Responses: State content correctly Electronic Signature(s) Signed: 05/18/2019 12:47:05 PM By: Rodell Perna Entered By: Rodell Perna on 05/18/2019 11:01:03 [place], Laura M. ([id number]) -------------------------------------------------------------------------------- Wound Assessment Details Patient Name: [name]. Date of Service: 05/18/2019 9:45 AM Medical Record Number: [id number] Patient Account Number: [id number] Date of Birth/Sex: [birth year] (68 y.o. F) Treating RN: Curtis Sites Primary Care Dave Mergen: Joycelyn Man Other Clinician: Referring Katelinn Justice: TATE, Tresa Endo Treating Vladislav Axelson/Extender: Linwood Dibbles, HOYT Weeks in Treatment: 0 Wound  Status Wound Number: 1 Primary Etiology: Trauma, Other Wound Location: Right, Lateral Lower Leg Wound Status: Open Wounding Event: Trauma Comorbid History: Hypertension Date Acquired: 05/10/2019 Weeks Of Treatment: 0 Clustered Wound: No Photos Wound Measurements Length: (cm) 3.4 Width: (cm) 3 Depth: (cm) 0.3 Area: (cm) 8.011 Volume: (cm) 2.403 % Reduction in Area: % Reduction in Volume: Epithelialization: None Tunneling: No Undermining: No Wound Description Classification: Full Thickness Without Exposed Support Structu Wound Margin: Flat and Intact Exudate Amount: Medium Exudate Type: Serous Exudate Color: amber res Foul Odor After Cleansing: No Slough/Fibrino Yes Wound Bed Granulation Amount: Small (1-33%) Exposed Structure Granulation Quality: Pink Fascia Exposed: No Necrotic Amount: Large (67-100%) Fat Layer (Subcutaneous Tissue) Exposed: Yes Necrotic Quality: Adherent Slough Tendon Exposed: No Muscle Exposed: No Joint Exposed: No Bone Exposed: No Treatment Notes Wound #1 (Right, Lateral Lower Leg) Notes scell, ABD, conform, tubi F Electronic Signature(s) Signed: 05/18/2019 4:42:54 PM By: Garlan Fillers ([id number]) Entered By: Curtis Sites on 05/18/2019 10:39:03 [name] ([id number]) -------------------------------------------------------------------------------- Vitals Details Patient Name: [name]. Date of Service: 05/18/2019 9:45 AM Medical Record Number: [id number] Patient Account Number: [id number] Date of Birth/Sex: [birth year] (68 y.o. F) Treating RN: Curtis Sites Primary Care Tyren Dugar: Joycelyn Man Other Clinician: Referring Jaterrius Ricketson: TATE, Tresa Endo Treating Suliman Termini/Extender: Linwood Dibbles, HOYT Weeks in Treatment: 0 Vital Signs Time Taken: 10:27 Temperature (F): 98.4 Height (in): 66 Pulse (bpm): 76 Source: Stated Respiratory Rate (breaths/min): 16 Weight (lbs): 130 Blood Pressure (mmHg): 153/83 Source:  Measured Reference  Range: 80 - 120 mg / dl Body Mass Index (BMI): 21 Electronic Signature(s) Signed: 05/18/2019 4:42:54 PM By: Curtis Sites Entered By: Curtis Sites on 05/18/2019 10:27:55

## 2019-05-19 NOTE — Progress Notes (Signed)
[name], [name] ([id number]) Visit Report for 05/18/2019 Abuse/Suicide Risk Screen Details Patient Name: [name], [name]. Date of Service: 05/18/2019 9:45 AM Medical Record Number: [id number] Patient Account Number: [id number] Date of Birth/Sex: Sep 05, 1950 (69 y.o. F) Treating RN: Montey Hora Primary Care Vickey Ewbank: Fenton Malling Other Clinician: Referring Varie Machamer: TATE, Claiborne Billings Treating Shakedra Beam/Extender: Melburn Hake, HOYT Weeks in Treatment: 0 Abuse/Suicide Risk Screen Items Answer ABUSE RISK SCREEN: Has anyone close to you tried to hurt or harm you recentlyo No Do you feel uncomfortable with anyone in your familyo No Has anyone forced you do things that you didnot want to doo No Electronic Signature(s) Signed: 05/18/2019 4:42:54 PM By: Montey Hora Entered By: Montey Hora on 05/18/2019 10:28:06 [name] ([id number]) -------------------------------------------------------------------------------- Activities of Daily Living Details Patient Name: [name], [name]. Date of Service: 05/18/2019 9:45 AM Medical Record Number: [id number] Patient Account Number: [id number] Date of Birth/Sex: 01-10-1951 (69 y.o. F) Treating RN: Montey Hora Primary Care Sandrina Heaton: Fenton Malling Other Clinician: Referring Bekka Qian: TATE, Claiborne Billings Treating Kirsi Hugh/Extender: Melburn Hake, HOYT Weeks in Treatment: 0 Activities of Daily Living Items Answer Activities of Daily Living (Please select one for each item) Drive Automobile Completely Able Take Medications Completely Able Use Telephone Completely Able Care for Appearance Completely Able Use Toilet Completely Able Bath / Shower Completely Able Dress Self Completely Able Feed Self Completely Able Walk Completely Able Get In / Out Bed Completely Able Housework Completely Able Prepare Meals Completely [place] for Self Completely Able Electronic Signature(s) Signed: 05/18/2019 4:42:54 PM By: Montey Hora Entered By: Montey Hora on 05/18/2019 10:28:27 [name] ([id number]) -------------------------------------------------------------------------------- Education Screening Details Patient Name: [name]. Date of Service: 05/18/2019 9:45 AM Medical Record Number: [id number] Patient Account Number: [id number] Date of Birth/Sex: Nov 11, 1950 (69 y.o. F) Treating RN: Montey Hora Primary Care Phoenicia Pirie: Fenton Malling Other Clinician: Referring Ashon Rosenberg: Barkley Boards Treating Hadi Dubin/Extender: Sharalyn Ink in Treatment: 0 Primary Learner Assessed: Patient Learning Preferences/Education Level/Primary Language Learning Preference: Explanation, Demonstration Highest Education Level: High School Preferred Language: English Cognitive Barrier Language Barrier: No Translator Needed: No Memory Deficit: No Emotional Barrier: No Cultural/Religious Beliefs Affecting Medical Care: No Physical Barrier Impaired Vision: No Impaired Hearing: No Decreased Hand dexterity: No Knowledge/Comprehension Knowledge Level: Medium Comprehension Level: Medium Ability to understand written instructions: Medium Ability to understand verbal instructions: Medium Motivation Anxiety Level: Calm Cooperation: Cooperative Education Importance: Acknowledges Need Interest in Health Problems: Asks Questions Perception: Coherent Willingness to Engage in Self-Management Medium Activities: Readiness to Engage in Self-Management Medium Activities: Electronic Signature(s) Signed: 05/18/2019 4:42:54 PM By: Montey Hora Entered By: Montey Hora on 05/18/2019 10:28:49 [name] ([id number]) -------------------------------------------------------------------------------- Fall Risk Assessment Details Patient Name: [name]. Date of Service: 05/18/2019 9:45 AM Medical Record Number: [id number] Patient Account Number: [id number] Date of Birth/Sex: 27-Sep-1950 (69 y.o. F) Treating  RN: Montey Hora Primary Care Endya Austin: Fenton Malling Other Clinician: Referring Zully Frane: TATE, Claiborne Billings Treating Achsah Mcquade/Extender: Melburn Hake, HOYT Weeks in Treatment: 0 Fall Risk Assessment Items Have you had 2 or more falls in the last 12 monthso 0 No Have you had any fall that resulted in injury in the last 12 monthso 0 No FALLS RISK SCREEN History of falling - immediate or within 3 months 0 No Secondary diagnosis (Do you have 2 or more medical diagnoseso) 0 No Ambulatory aid None/bed rest/wheelchair/nurse 0 Yes Crutches/cane/walker 0 No Furniture 0 No Intravenous therapy Access/Saline/Heparin Lock 0 No Gait/Transferring Normal/ bed rest/ wheelchair 0 Yes Weak (short steps  with or without shuffle, stooped but able to lift head while walking, may 0 No seek support from furniture) Impaired (short steps with shuffle, may have difficulty arising from chair, head down, impaired 0 No balance) Mental Status Oriented to own ability 0 Yes Electronic Signature(s) Signed: 05/18/2019 4:42:54 PM By: Curtis Sites Entered By: Curtis Sites on 05/18/2019 10:29:02 [name] ([id number]) -------------------------------------------------------------------------------- Foot Assessment Details Patient Name: [name]. Date of Service: 05/18/2019 9:45 AM Medical Record Number: [id number] Patient Account Number: [id number] Date of Birth/Sex: Mar 12, 1950 (69 y.o. F) Treating RN: Curtis Sites Primary Care Pebble Botkin: Joycelyn Man Other Clinician: Referring Codylee Patil: TATE, Tresa Endo Treating Regnald Bowens/Extender: Linwood Dibbles, HOYT Weeks in Treatment: 0 Foot Assessment Items Site Locations + = Sensation present, - = Sensation absent, C = Callus, U = Ulcer R = Redness, W = Warmth, M = Maceration, PU = Pre-ulcerative lesion F = Fissure, S = Swelling, D = Dryness Assessment Right: Left: Other Deformity: No No Prior Foot Ulcer: No No Prior Amputation: No No Charcot Joint: No  No Ambulatory Status: Ambulatory Without Help Gait: Steady Electronic Signature(s) Signed: 05/18/2019 4:42:54 PM By: Curtis Sites Entered By: Curtis Sites on 05/18/2019 10:29:19 [name] ([id number]) -------------------------------------------------------------------------------- Nutrition Risk Screening Details Patient Name: [name]. Date of Service: 05/18/2019 9:45 AM Medical Record Number: [id number] Patient Account Number: [id number] Date of Birth/Sex: [birth year] (68 y.o. F) Treating RN: Curtis Sites Primary Care Jairo Bellew: Joycelyn Man Other Clinician: Referring Arianny Pun: TATE, Tresa Endo Treating Gilmore List/Extender: Linwood Dibbles, HOYT Weeks in Treatment: 0 Height (in): 66 Weight (lbs): 130 Body Mass Index (BMI): 21 Nutrition Risk Screening Items Score Screening NUTRITION RISK SCREEN: I have an illness or condition that made me change the kind and/or amount of food I eat 0 No I eat fewer than two meals per day 0 No I eat few fruits and vegetables, or milk products 0 No I have three or more drinks of beer, liquor or wine almost every day 0 No I have tooth or mouth problems that make it hard for me to eat 0 No I don't always have enough money to buy the food I need 0 No I eat alone most of the time 0 No I take three or more different prescribed or over-the-counter drugs a day 1 Yes Without wanting to, I have lost or gained 10 pounds in the last six months 0 No I am not always physically able to shop, cook and/or feed myself 0 No Nutrition Protocols Good Risk Protocol 0 No interventions needed Moderate Risk Protocol High Risk Proctocol Risk Level: Good Risk Score: 1 Electronic Signature(s) Signed: 05/18/2019 4:42:54 PM By: Curtis Sites Entered By: Curtis Sites on 05/18/2019 10:29:08

## 2019-05-20 NOTE — Progress Notes (Signed)
[name] ([id number]) Visit Report for 05/18/2019 Chief Complaint Document Details Patient Name: [name], [name]. Date of Service: 05/18/2019 9:45 AM Medical Record Number: [id number] Patient Account Number: [id number] Date of Birth/Sex: 29-Jun-1950 (68 y.o. F) Treating RN: [name] Primary Care Provider: Fenton Moss Other Clinician: Referring Provider: Barkley Moss Treating Provider/Extender: [name], [name] Weeks in Treatment: 0 Information Obtained from: Patient Chief Complaint Right LE Ulcer with Cellulitis Electronic Signature(s) Signed: 05/18/2019 10:47:19 AM By: Laura Keeler PA-C Entered By: [name] on 05/18/2019 10:47:19 [name] ([id number]) -------------------------------------------------------------------------------- Debridement Details Patient Name: [name]. Date of Service: 05/18/2019 9:45 AM Medical Record Number: [id number] Patient Account Number: [id number] Date of Birth/Sex: 10/05/1950 (68 y.o. F) Treating RN: [name] Primary Care Provider: Fenton Moss Other Clinician: Referring Provider: TATE, Claiborne Moss Treating Provider/Extender: [name], [name] Weeks in Treatment: 0 Debridement Performed for Wound #1 Right,Lateral Lower Leg Assessment: Performed By: Physician Laura III, Cherish Runde E., PA-C Debridement Type: Debridement Level of Consciousness (Pre- Awake and Alert procedure): Pre-procedure Verification/Time Out Yes - 10:50 Taken: Start Time: 10:51 Pain Control: Lidocaine Total Area Debrided (L x W): 3.4 (cm) x 3 (cm) = 10.2 (cm) Tissue and other material Non-Viable, Slough, Subcutaneous, Slough debrided: Level: Skin/Subcutaneous Tissue Debridement Description: Excisional Instrument: Curette Bleeding: Minimum Hemostasis Achieved: Pressure End Time: 10:56 Response to Treatment: Procedure was tolerated well Level of Consciousness (Post- Awake and Alert procedure): Post Debridement Measurements of Total  Wound Length: (cm) 3.4 Width: (cm) 3 Depth: (cm) 0.3 Volume: (cm) 2.403 Character of Wound/Ulcer Post Debridement: Stable Post Procedure Diagnosis Same as Pre-procedure Electronic Signature(s) Signed: 05/18/2019 12:47:05 PM By: [name] Signed: 05/19/2019 5:05:02 PM By: Laura Keeler PA-C Entered By: [name] on 05/18/2019 10:57:36 Wachter, Laura M. ([id number]) -------------------------------------------------------------------------------- HPI Details Patient Name: [name]. Date of Service: 05/18/2019 9:45 AM Medical Record Number: [id number] Patient Account Number: [id number] Date of Birth/Sex: [birth year] (68 y.o. F) Treating RN: [name] Primary Care Provider: Fenton Moss Other Clinician: Referring Provider: Barkley Moss Treating Provider/Extender: [name], [name] Weeks in Treatment: 0 History of Present Illness HPI Description: 05/19/2019 upon evaluation today patient presents for initial inspection here in our clinic concerning an injury that she has to the right lateral lower extremity. She does have associated cellulitis as well. Unfortunately she is continuing to have some discomfort at this point. She has been seen and prescribed doxycycline which she is currently taking that is excellent news. With that being said she fortunately does not appear to have any significant edema of the lower extremities at this point other than right around the wound area I do not think she has venous stasis which should be good for her as far as healing is concerned she is less likely to have complications. We still may want to use something such as Tubigrip in order to help with just some of the local edema at this time. The patient is in agreement with that plan. I am also can have to likely perform some debridement to clear away some of the necrotic tissue currently as far as the wound surface is concerned. The patient has a history of hypertension but otherwise has no  major medical problems. Electronic Signature(s) Signed: 05/19/2019 4:47:06 PM By: Laura Keeler PA-C Entered By: [name] on 05/19/2019 16:47:06 [name], [name] ([id number]) -------------------------------------------------------------------------------- Physical Exam Details Patient Name: [name], [name]. Date of Service: 05/18/2019 9:45 AM Medical Record Number: [id number] Patient Account Number: [id number] Date of Birth/Sex:  August 20, 1950 (69 y.o. F) Treating RN: [name] Primary Care Provider: Joycelyn Moss Other Clinician: Referring Provider: TATE, Tresa Moss Treating Provider/Extender: [name], [name] Weeks in Treatment: 0 Constitutional patient is hypertensive.. pulse regular and within target range for patient.Marland Kitchen respirations regular, non-labored and within target range for patient.Marland Kitchen temperature within target range for patient.. Well-nourished and well-hydrated in no acute distress. Eyes conjunctiva clear no eyelid edema noted. pupils equal round and reactive to light and accommodation. Ears, Nose, Mouth, and Throat no gross abnormality of ear auricles or external auditory canals. normal hearing noted during conversation. mucus membranes moist. Respiratory normal breathing without difficulty. Cardiovascular 2+ dorsalis pedis/posterior tibialis pulses. no clubbing, cyanosis, significant edema, <3 sec cap refill. Musculoskeletal normal gait and posture. no significant deformity or arthritic changes, no loss or range of motion, no clubbing. Psychiatric this patient is able to make decisions and demonstrates good insight into disease process. Alert and Oriented x 3. pleasant and cooperative. Notes Upon inspection patient's wound bed did require some sharp debridement to clear away some of the slough and necrotic debris from the surface of the wound. She actually tolerated this with some discomfort but fortunately I was able to clear away most of the necrotic debris  without complication and post debridement the wound actually appears to be doing somewhat better. Fortunately there is no evidence of systemic infection though again there is still some evidence of local infection fortunately she is on antibiotic therapy at this point. Doxycycline Electronic Signature(s) Signed: 05/19/2019 4:49:58 PM By: Lenda Kelp PA-C Entered By: Lenda Kelp on 05/19/2019 16:49:58 [name] ([id number]) -------------------------------------------------------------------------------- Physician Orders Details Patient Name: [name]. Date of Service: 05/18/2019 9:45 AM Medical Record Number: [id number] Patient Account Number: [id number] Date of Birth/Sex: 01-18-1951 (68 y.o. F) Treating RN: [name] Primary Care Provider: Joycelyn Moss Other Clinician: Referring Provider: TATE, Tresa Moss Treating Provider/Extender: [name], Lenville Hibberd Weeks in Treatment: 0 Verbal / Phone Orders: No Diagnosis Coding ICD-10 Coding Code Description S81.801A Unspecified open wound, right lower leg, initial encounter L97.812 Non-pressure chronic ulcer of other part of right lower leg with fat layer exposed L03.115 Cellulitis of right lower limb I10 Essential (primary) hypertension Wound Cleansing Wound #1 Right,Lateral Lower Leg o Clean wound with Normal Saline. - in office o Dial antibacterial soap, wash wounds, rinse and pat dry prior to dressing wounds Primary Wound Dressing Wound #1 Right,Lateral Lower Leg o Silver Alginate Secondary Dressing o ABD and Kerlix/Conform Dressing Change Frequency Wound #1 Right,Lateral Lower Leg o Change dressing every other day. Follow-up Appointments Wound #1 Right,Lateral Lower Leg o Return Appointment in 1 week. Edema Control Wound #1 Right,Lateral Lower Leg o Elevate legs to the level of the heart and pump ankles as often as possible o Other: - tubi grip Electronic Signature(s) Signed: 05/18/2019 12:47:05 PM  By: [name] Signed: 05/19/2019 5:05:02 PM By: Lenda Kelp PA-C Entered By: [name] on 05/18/2019 11:00:22 [name] ([id number]) -------------------------------------------------------------------------------- Problem List Details Patient Name: [name], [name]. Date of Service: 05/18/2019 9:45 AM Medical Record Number: [id number] Patient Account Number: [id number] Date of Birth/Sex: 04-Nov-1950 (68 y.o. F) Treating RN: [name] Primary Care Provider: Joycelyn Moss Other Clinician: Referring Provider: Wendee Beavers Treating Provider/Extender: [name], Abanoub Hanken Weeks in Treatment: 0 Active Problems ICD-10 Encounter Code Description Active Date MDM Diagnosis S81.801A Unspecified open wound, right lower leg, initial encounter 05/18/2019 No Yes L97.812 Non-pressure chronic ulcer of other part of right lower leg with fat layer 05/18/2019 No Yes  exposed L03.115 Cellulitis of right lower limb 05/18/2019 No Yes I10 Essential (primary) hypertension 05/18/2019 No Yes Inactive Problems Resolved Problems Electronic Signature(s) Signed: 05/18/2019 10:46:53 AM By: Lenda Kelp PA-C Entered By: Lenda Kelp on 05/18/2019 10:46:53 Arocho, Roselinda M. ([id number]) -------------------------------------------------------------------------------- Progress Note Details Patient Name: [name]. Date of Service: 05/18/2019 9:45 AM Medical Record Number: [id number] Patient Account Number: [id number] Date of Birth/Sex: 10-10-1950 (68 y.o. F) Treating RN: [name] Primary Care Provider: Joycelyn Moss Other Clinician: Referring Provider: Wendee Beavers Treating Provider/Extender: [name], Eaven Schwager Weeks in Treatment: 0 Subjective Chief Complaint Information obtained from Patient Right LE Ulcer with Cellulitis History of Present Illness (HPI) 05/19/2019 upon evaluation today patient presents for initial inspection here in our clinic concerning an injury that she has to the right  lateral lower extremity. She does have associated cellulitis as well. Unfortunately she is continuing to have some discomfort at this point. She has been seen and prescribed doxycycline which she is currently taking that is excellent news. With that being said she fortunately does not appear to have any significant edema of the lower extremities at this point other than right around the wound area I do not think she has venous stasis which should be good for her as far as healing is concerned she is less likely to have complications. We still may want to use something such as Tubigrip in order to help with just some of the local edema at this time. The patient is in agreement with that plan. I am also can have to likely perform some debridement to clear away some of the necrotic tissue currently as far as the wound surface is concerned. The patient has a history of hypertension but otherwise has no major medical problems. Patient History Information obtained from Patient. Allergies penicillin (Reaction: rash), Sulfa (Sulfonamide Antibiotics) (Reaction: rash) Family History Cancer - Father, Diabetes - Siblings, Heart Disease - Mother,Father, Hypertension - Mother,Father, Stroke - Mother, No family history of Hereditary Spherocytosis, Kidney Disease, Lung Disease, Seizures, Thyroid Problems, Tuberculosis. Social History Former smoker - 1998, Marital Status - Married, Alcohol Use - Never, Drug Use - No History, Caffeine Use - Daily. Medical History Cardiovascular Patient has history of Hypertension Denies history of Angina, Arrhythmia, Congestive Heart Failure, Coronary Artery Disease, Deep Vein Thrombosis, Hypotension, Myocardial Infarction, Peripheral Arterial Disease, Peripheral Venous Disease, Phlebitis, Vasculitis Integumentary (Skin) Denies history of History of Burn, History of pressure wounds Medical And Surgical History Notes Cardiovascular HLD Review of Systems (ROS) Constitutional  Symptoms (General Health) Denies complaints or symptoms of Fatigue, Fever, Chills, Marked Weight Change. Eyes Denies complaints or symptoms of Dry Eyes, Vision Changes, Glasses / Contacts. Ear/Nose/Mouth/Throat Denies complaints or symptoms of Difficult clearing ears, Sinusitis. Hematologic/Lymphatic Denies complaints or symptoms of Bleeding / Clotting Disorders, Human Immunodeficiency Virus. Respiratory Denies complaints or symptoms of Chronic or frequent coughs, Shortness of Breath. Cardiovascular Denies complaints or symptoms of Chest pain, LE edema. Gastrointestinal Denies complaints or symptoms of Frequent diarrhea, Nausea, Vomiting. Endocrine Denies complaints or symptoms of Hepatitis, Thyroid disease, Polydypsia (Excessive Thirst). Genitourinary Denies complaints or symptoms of Kidney failure/ Dialysis, Incontinence/dribbling. Immunological Denies complaints or symptoms of Hives, Itching. Integumentary (Skin) Complains or has symptoms of Wounds. Denies complaints or symptoms of Bleeding or bruising tendency, Breakdown, Swelling. [name], [name]. ([id number]) Musculoskeletal Denies complaints or symptoms of Muscle Pain, Muscle Weakness. Neurologic Denies complaints or symptoms of Numbness/parasthesias, Focal/Weakness. Psychiatric Denies complaints or symptoms of Anxiety, Claustrophobia. Objective Constitutional patient is hypertensive.. pulse regular  and within target range for patient.Marland Kitchen respirations regular, non-labored and within target range for patient.Marland Kitchen temperature within target range for patient.. Well-nourished and well-hydrated in no acute distress. Vitals Time Taken: 10:27 AM, Height: 66 in, Source: Stated, Weight: 130 lbs, Source: Measured, BMI: 21, Temperature: 98.4 F, Pulse: 76 bpm, Respiratory Rate: 16 breaths/min, Blood Pressure: 153/83 mmHg. Eyes conjunctiva clear no eyelid edema noted. pupils equal round and reactive to light and accommodation. Ears, Nose,  Mouth, and Throat no gross abnormality of ear auricles or external auditory canals. normal hearing noted during conversation. mucus membranes moist. Respiratory normal breathing without difficulty. Cardiovascular 2+ dorsalis pedis/posterior tibialis pulses. no clubbing, cyanosis, significant edema, Musculoskeletal normal gait and posture. no significant deformity or arthritic changes, no loss or range of motion, no clubbing. Psychiatric this patient is able to make decisions and demonstrates good insight into disease process. Alert and Oriented x 3. pleasant and cooperative. General Notes: Upon inspection patient's wound bed did require some sharp debridement to clear away some of the slough and necrotic debris from the surface of the wound. She actually tolerated this with some discomfort but fortunately I was able to clear away most of the necrotic debris without complication and post debridement the wound actually appears to be doing somewhat better. Fortunately there is no evidence of systemic infection though again there is still some evidence of local infection fortunately she is on antibiotic therapy at this point. Doxycycline Integumentary (Hair, Skin) Wound #1 status is Open. Original cause of wound was Trauma. The wound is located on the Right,Lateral Lower Leg. The wound measures 3.4cm length x 3cm width x 0.3cm depth; 8.011cm^2 area and 2.403cm^3 volume. There is Fat Layer (Subcutaneous Tissue) Exposed exposed. There is no tunneling or undermining noted. There is a medium amount of serous drainage noted. The wound margin is flat and intact. There is small (1-33%) pink granulation within the wound bed. There is a large (67-100%) amount of necrotic tissue within the wound bed including Adherent Slough. Assessment Active Problems ICD-10 Unspecified open wound, right lower leg, initial encounter Non-pressure chronic ulcer of other part of right lower leg with fat layer  exposed Cellulitis of right lower limb Essential (primary) hypertension Procedures Rathke, Laura M. ([id number]) Wound #1 Pre-procedure diagnosis of Wound #1 is a Trauma, Other located on the Right,Lateral Lower Leg . There was a Excisional Skin/Subcutaneous Tissue Debridement with a total area of 10.2 sq cm performed by Laura III, Hoda Hon E., PA-C. With the following instrument(s): Curette to remove Non-Viable tissue/material. Material removed includes Subcutaneous Tissue and Slough and after achieving pain control using Lidocaine. A time out was conducted at 10:50, prior to the start of the procedure. A Minimum amount of bleeding was controlled with Pressure. The procedure was tolerated well. Post Debridement Measurements: 3.4cm length x 3cm width x 0.3cm depth; 2.403cm^3 volume. Character of Wound/Ulcer Post Debridement is stable. Post procedure Diagnosis Wound #1: Same as Pre-Procedure Plan Wound Cleansing: Wound #1 Right,Lateral Lower Leg: Clean wound with Normal Saline. - in office Dial antibacterial soap, wash wounds, rinse and pat dry prior to dressing wounds Primary Wound Dressing: Wound #1 Right,Lateral Lower Leg: Silver Alginate Secondary Dressing: ABD and Kerlix/Conform Dressing Change Frequency: Wound #1 Right,Lateral Lower Leg: Change dressing every other day. Follow-up Appointments: Wound #1 Right,Lateral Lower Leg: Return Appointment in 1 week. Edema Control: Wound #1 Right,Lateral Lower Leg: Elevate legs to the level of the heart and pump ankles as often as possible Other: - tubi grip 1. My suggestion  at this time is good to be that we go ahead and initiate treatment with a silver alginate dressing which I think is good to be the best option for the patient at this point. 2. I am also can recommend currently that we have the patient continue to elevate her leg to some degree when she is sitting again she is not swelling too much and I think the Tubigrip will help in  this regard but nonetheless I still do not want to let things get out of control. 3. She will continue with the doxycycline I think that is a good option for her at this point. We will see patient back for reevaluation in 1 week here in the clinic. If anything worsens or changes patient will contact our office for additional recommendations. Electronic Signature(s) Signed: 05/19/2019 4:52:18 PM By: Lenda KelpStone III, Pennye Beeghly PA-C Entered By: Lenda KelpStone III, Jakeb Lamping on 05/19/2019 16:52:17 [name], Laura M. ([id number]) -------------------------------------------------------------------------------- ROS/PFSH Details Patient Name: [name], Laura M. Date of Service: 05/18/2019 9:45 AM Medical Record Number: [id number] Patient Account Number: [id number] Date of Birth/Sex: 11/11/1950 (68 y.o. F) Treating RN: Curtis Sitesorthy, Joanna Primary Care Provider: Joycelyn ManBurnette, Jennifer Other Clinician: Referring Provider: TATE, Tresa EndoKELLY Treating Provider/Extender: Laura DibblesSTONE III, Rida Loudin Weeks in Treatment: 0 Information Obtained From Patient Constitutional Symptoms (General Health) Complaints and Symptoms: Negative for: Fatigue; Fever; Chills; Marked Weight Change Eyes Complaints and Symptoms: Negative for: Dry Eyes; Vision Changes; Glasses / Contacts Ear/Nose/Mouth/Throat Complaints and Symptoms: Negative for: Difficult clearing ears; Sinusitis Hematologic/Lymphatic Complaints and Symptoms: Negative for: Bleeding / Clotting Disorders; Human Immunodeficiency Virus Respiratory Complaints and Symptoms: Negative for: Chronic or frequent coughs; Shortness of Breath Cardiovascular Complaints and Symptoms: Negative for: Chest pain; LE edema Medical History: Positive for: Hypertension Negative for: Angina; Arrhythmia; Congestive Heart Failure; Coronary Artery Disease; Deep Vein Thrombosis; Hypotension; Myocardial Infarction; Peripheral Arterial Disease; Peripheral Venous Disease; Phlebitis; Vasculitis Past Medical History  Notes: HLD Gastrointestinal Complaints and Symptoms: Negative for: Frequent diarrhea; Nausea; Vomiting Endocrine Complaints and Symptoms: Negative for: Hepatitis; Thyroid disease; Polydypsia (Excessive Thirst) Genitourinary Complaints and Symptoms: Negative for: Kidney failure/ Dialysis; Incontinence/dribbling Immunological Complaints and Symptoms: Negative for: Hives; Itching [name], Nikko M. ([id number]) Integumentary (Skin) Complaints and Symptoms: Positive for: Wounds Negative for: Bleeding or bruising tendency; Breakdown; Swelling Medical History: Negative for: History of Burn; History of pressure wounds Musculoskeletal Complaints and Symptoms: Negative for: Muscle Pain; Muscle Weakness Neurologic Complaints and Symptoms: Negative for: Numbness/parasthesias; Focal/Weakness Psychiatric Complaints and Symptoms: Negative for: Anxiety; Claustrophobia Oncologic Immunizations Pneumococcal Vaccine: Received Pneumococcal Vaccination: Yes Immunization Notes: up to date Implantable Devices None Family and Social History Cancer: Yes - Father; Diabetes: Yes - Siblings; Heart Disease: Yes - Mother,Father; Hereditary Spherocytosis: No; Hypertension: Yes - Mother,Father; Kidney Disease: No; Lung Disease: No; Seizures: No; Stroke: Yes - Mother; Thyroid Problems: No; Tuberculosis: No; Former smoker - 1998; Marital Status - Married; Alcohol Use: Never; Drug Use: No History; Caffeine Use: Daily; Financial Concerns: No; Food, Clothing or Shelter Needs: No; Support System Lacking: No; Transportation Concerns: No Electronic Signature(s) Signed: 05/18/2019 4:42:54 PM By: Curtis Sitesorthy, Joanna Signed: 05/19/2019 5:05:02 PM By: Lenda KelpStone III, Erique Kaser PA-C Entered By: Curtis Sitesorthy, Joanna on 05/18/2019 10:31:45 [name], Berkley M. ([id number]) -------------------------------------------------------------------------------- SuperBill Details Patient Name: [name], Lillis M. Date of Service: 05/18/2019 Medical Record  Number: [id number] Patient Account Number: [id number] Date of Birth/Sex: 02/26/1950 [age] y.o. F) Treating RN: Laura PernaScott, Dajea Primary Care Provider: Joycelyn ManBurnette, Jennifer Other Clinician: Referring Provider: TATE, Tresa EndoKELLY Treating Provider/Extender: Laura III, Keenan Trefry Weeks in Treatment: 0 Diagnosis Coding ICD-10 Codes Code  Description S81.801A Unspecified open wound, right lower leg, initial encounter L97.812 Non-pressure chronic ulcer of other part of right lower leg with fat layer exposed L03.115 Cellulitis of right lower limb I10 Essential (primary) hypertension Facility Procedures CPT4 Code: 79038333 Description: 99213 - WOUND CARE VISIT-LEV 3 EST PT Modifier: Quantity: 1 CPT4 Code: 83291916 Description: 11042 - DEB SUBQ TISSUE 20 SQ CM/< Modifier: Quantity: 1 CPT4 Code: Description: ICD-10 Diagnosis Description L97.812 Non-pressure chronic ulcer of other part of right lower leg with fat laye Modifier: r exposed Quantity: Physician Procedures CPT4 Code: 6060045 Description: WC PHYS LEVEL 3 o NEW PT Modifier: 25 Quantity: 1 CPT4 Code: Description: ICD-10 Diagnosis Description S81.801A Unspecified open wound, right lower leg, initial encounter L97.812 Non-pressure chronic ulcer of other part of right lower leg with fat lay L03.115 Cellulitis of right lower limb I10 Essential (primary)  hypertension Modifier: er exposed Quantity: CPT4 Code: 9977414 Description: 11042 - WC PHYS SUBQ TISS 20 SQ CM Modifier: Quantity: 1 CPT4 Code: Description: ICD-10 Diagnosis Description L97.812 Non-pressure chronic ulcer of other part of right lower leg with fat lay Modifier: er exposed Quantity: Electronic Signature(s) Signed: 05/18/2019 5:56:35 PM By: Lenda Kelp PA-C Entered By: Lenda Kelp on 05/18/2019 17:56:35

## 2019-05-25 ENCOUNTER — Encounter: Payer: Medicare Other | Attending: Physician Assistant | Admitting: Physician Assistant

## 2019-05-25 ENCOUNTER — Other Ambulatory Visit: Payer: Self-pay

## 2019-05-25 DIAGNOSIS — I10 Essential (primary) hypertension: Secondary | ICD-10-CM | POA: Insufficient documentation

## 2019-05-25 DIAGNOSIS — L97812 Non-pressure chronic ulcer of other part of right lower leg with fat layer exposed: Secondary | ICD-10-CM | POA: Insufficient documentation

## 2019-05-25 NOTE — Progress Notes (Addendum)
[name] ([id number]) Visit Report for 05/25/2019 Chief Complaint Document Details Patient Name: [name], [name]. Date of Service: 05/25/2019 9:15 AM Medical Record Number: [id number] Patient Account Number: [id number] Date of Birth/Sex: Feb 18, 1950 (69 y.o. F) Treating RN: Army Melia Primary Care Provider: Fenton Malling Other Clinician: Referring Provider: Fenton Malling Treating Provider/Extender: Melburn Hake, Deon Duer Weeks in Treatment: 1 Information Obtained from: Patient Chief Complaint Right LE Ulcer with Cellulitis Electronic Signature(s) Signed: 05/25/2019 9:21:26 AM By: Worthy Keeler PA-C Entered By: Worthy Keeler on 05/25/2019 09:21:26 [name] ([id number]) -------------------------------------------------------------------------------- Debridement Details Patient Name: [name]. Date of Service: 05/25/2019 9:15 AM Medical Record Number: [id number] Patient Account Number: [id number] Date of Birth/Sex: [birth year] (69 y.o. F) Treating RN: Army Melia Primary Care Provider: Fenton Malling Other Clinician: Referring Provider: Fenton Malling Treating Provider/Extender: Melburn Hake, Clint Strupp Weeks in Treatment: 1 Debridement Performed for Wound #1 Right,Lateral Lower Leg Assessment: Performed By: Physician STONE III, Marcio Hoque E., PA-C Debridement Type: Debridement Level of Consciousness (Pre- Awake and Alert procedure): Pre-procedure Verification/Time Out Yes - 09:37 Taken: Start Time: 09:38 Pain Control: Lidocaine Total Area Debrided (L x W): 3.6 (cm) x 2.8 (cm) = 10.08 (cm) Tissue and other material Viable, Non-Viable, Subcutaneous, Skin: Dermis debrided: Level: Skin/Subcutaneous Tissue Debridement Description: Excisional Instrument: Forceps, Scissors Bleeding: Minimum Hemostasis Achieved: Pressure End Time: 09:40 Response to Treatment: Procedure was tolerated well Level of Consciousness (Post- Awake and Alert procedure): Post Debridement  Measurements of Total Wound Length: (cm) 3.6 Width: (cm) 2.8 Depth: (cm) 0.2 Volume: (cm) 1.583 Character of Wound/Ulcer Post Debridement: Stable Post Procedure Diagnosis Same as Pre-procedure Electronic Signature(s) Signed: 05/25/2019 11:36:51 AM By: Army Melia Signed: 05/25/2019 4:59:38 PM By: Worthy Keeler PA-C Entered By: Army Melia on 05/25/2019 09:39:26 [name], Laura M. ([id number]) -------------------------------------------------------------------------------- HPI Details Patient Name: [name]. Date of Service: 05/25/2019 9:15 AM Medical Record Number: [id number] Patient Account Number: [id number] Date of Birth/Sex: 1950-06-25 (69 y.o. F) Treating RN: Army Melia Primary Care Provider: Fenton Malling Other Clinician: Referring Provider: Fenton Malling Treating Provider/Extender: Melburn Hake, Annaliese Saez Weeks in Treatment: 1 History of Present Illness HPI Description: 05/19/2019 upon evaluation today patient presents for initial inspection here in our clinic concerning an injury that she has to the right lateral lower extremity. She does have associated cellulitis as well. Unfortunately she is continuing to have some discomfort at this point. She has been seen and prescribed doxycycline which she is currently taking that is excellent news. With that being said she fortunately does not appear to have any significant edema of the lower extremities at this point other than right around the wound area I do not think she has venous stasis which should be good for her as far as healing is concerned she is less likely to have complications. We still may want to use something such as Tubigrip in order to help with just some of the local edema at this time. The patient is in agreement with that plan. I am also can have to likely perform some debridement to clear away some of the necrotic tissue currently as far as the wound surface is concerned. The patient has a history of  hypertension but otherwise has no major medical problems. 05/25/2019 upon evaluation today patient appears to be doing roughly the same with regard to her wound. Unfortunately the skin flap that I was hoping would survive does not appear to have. This is going to require debridement in order to clear this away so that the  wound can heal appropriately. That was discussed with the patient today and she understands and was completely expecting that based on how it looked as well. I would kind of forewarned her last week. Electronic Signature(s) Signed: 05/25/2019 4:39:36 PM By: Lenda Kelp PA-C Entered By: Lenda Kelp on 05/25/2019 16:39:36 [name], [name] ([id number]) -------------------------------------------------------------------------------- Physical Exam Details Patient Name: [name], [name]. Date of Service: 05/25/2019 9:15 AM Medical Record Number: [id number] Patient Account Number: [id number] Date of Birth/Sex: 07/10/1950 (69 y.o. F) Treating RN: Rodell Perna Primary Care Provider: Joycelyn Man Other Clinician: Referring Provider: Joycelyn Man Treating Provider/Extender: Linwood Dibbles, Toia Micale Weeks in Treatment: 1 Constitutional Well-nourished and well-hydrated in no acute distress. Respiratory normal breathing without difficulty. Psychiatric this patient is able to make decisions and demonstrates good insight into disease process. Alert and Oriented x 3. pleasant and cooperative. Notes Upon inspection patient's wound bed did require sharp debridement and this was a fairly aggressive debridement to remove the necrotic flap from the surface of the wound. This actually did make the wound significantly deeper compared to prior to debridement which was to be expected. However I was able to get it fairly clean today. Electronic Signature(s) Signed: 05/25/2019 4:40:03 PM By: Lenda Kelp PA-C Entered By: Lenda Kelp on 05/25/2019 16:40:02 [name]  ([id number]) -------------------------------------------------------------------------------- Physician Orders Details Patient Name: [name]. Date of Service: 05/25/2019 9:15 AM Medical Record Number: [id number] Patient Account Number: [id number] Date of Birth/Sex: September 15, 1950 (68 y.o. F) Treating RN: Rodell Perna Primary Care Provider: Joycelyn Man Other Clinician: Referring Provider: Joycelyn Man Treating Provider/Extender: Linwood Dibbles, Nilesh Stegall Weeks in Treatment: 1 Verbal / Phone Orders: No Diagnosis Coding ICD-10 Coding Code Description S81.801A Unspecified open wound, right lower leg, initial encounter L97.812 Non-pressure chronic ulcer of other part of right lower leg with fat layer exposed L03.115 Cellulitis of right lower limb I10 Essential (primary) hypertension Wound Cleansing Wound #1 Right,Lateral Lower Leg o Clean wound with Normal Saline. - in office o Dial antibacterial soap, wash wounds, rinse and pat dry prior to dressing wounds Primary Wound Dressing Wound #1 Right,Lateral Lower Leg o Silver Alginate Secondary Dressing o ABD and Kerlix/Conform Dressing Change Frequency Wound #1 Right,Lateral Lower Leg o Change dressing every other day. Follow-up Appointments Wound #1 Right,Lateral Lower Leg o Return Appointment in 1 week. Edema Control Wound #1 Right,Lateral Lower Leg o Elevate legs to the level of the heart and pump ankles as often as possible o Other: - tubi grip Electronic Signature(s) Signed: 05/25/2019 11:36:51 AM By: Rodell Perna Signed: 05/25/2019 4:59:38 PM By: Lenda Kelp PA-C Entered By: Rodell Perna on 05/25/2019 09:44:07 [name], Laura M. ([id number]) -------------------------------------------------------------------------------- Problem List Details Patient Name: CANDELA, KRUL. Date of Service: 05/25/2019 9:15 AM Medical Record Number: [id number] Patient Account Number: [id number] Date of Birth/Sex: 06-10-1950 (68 y.o.  F) Treating RN: Rodell Perna Primary Care Provider: Joycelyn Man Other Clinician: Referring Provider: Joycelyn Man Treating Provider/Extender: Linwood Dibbles, Corrina Steffensen Weeks in Treatment: 1 Active Problems ICD-10 Encounter Code Description Active Date MDM Diagnosis S81.801A Unspecified open wound, right lower leg, initial encounter 05/18/2019 No Yes L97.812 Non-pressure chronic ulcer of other part of right lower leg with fat layer 05/18/2019 No Yes exposed L03.115 Cellulitis of right lower limb 05/18/2019 No Yes I10 Essential (primary) hypertension 05/18/2019 No Yes Inactive Problems Resolved Problems Electronic Signature(s) Signed: 05/25/2019 9:21:11 AM By: Lenda Kelp PA-C Entered By: Lenda Kelp on 05/25/2019 09:21:11 [name] ([id number]) -------------------------------------------------------------------------------- Progress Note Details Patient  Name: [name], [name]. Date of Service: 05/25/2019 9:15 AM Medical Record Number: [id number] Patient Account Number: [id number] Date of Birth/Sex: [birth year] (68 y.o. F) Treating RN: Rodell Perna Primary Care Provider: Joycelyn Man Other Clinician: Referring Provider: Joycelyn Man Treating Provider/Extender: Linwood Dibbles, [place] Mccuen Weeks in Treatment: 1 Subjective Chief Complaint Information obtained from Patient Right LE Ulcer with Cellulitis History of Present Illness (HPI) 05/19/2019 upon evaluation today patient presents for initial inspection here in our clinic concerning an injury that she has to the right lateral lower extremity. She does have associated cellulitis as well. Unfortunately she is continuing to have some discomfort at this point. She has been seen and prescribed doxycycline which she is currently taking that is excellent news. With that being said she fortunately does not appear to have any significant edema of the lower extremities at this point other than right around the wound area I do not think  she has venous stasis which should be good for her as far as healing is concerned she is less likely to have complications. We still may want to use something such as Tubigrip in order to help with just some of the local edema at this time. The patient is in agreement with that plan. I am also can have to likely perform some debridement to clear away some of the necrotic tissue currently as far as the wound surface is concerned. The patient has a history of hypertension but otherwise has no major medical problems. 05/25/2019 upon evaluation today patient appears to be doing roughly the same with regard to her wound. Unfortunately the skin flap that I was hoping would survive does not appear to have. This is going to require debridement in order to clear this away so that the wound can heal appropriately. That was discussed with the patient today and she understands and was completely expecting that based on how it looked as well. I would kind of forewarned her last week. Objective Constitutional Well-nourished and well-hydrated in no acute distress. Vitals Time Taken: 9:24 AM, Height: 66 in, Weight: 130 lbs, BMI: 21, Temperature: 97.7 F, Pulse: 68 bpm, Respiratory Rate: 16 breaths/min, Blood Pressure: 160/72 mmHg. Respiratory normal breathing without difficulty. Psychiatric this patient is able to make decisions and demonstrates good insight into disease process. Alert and Oriented x 3. pleasant and cooperative. General Notes: Upon inspection patient's wound bed did require sharp debridement and this was a fairly aggressive debridement to remove the necrotic flap from the surface of the wound. This actually did make the wound significantly deeper compared to prior to debridement which was to be expected. However I was able to get it fairly clean today. Integumentary (Hair, Skin) Wound #1 status is Open. Original cause of wound was Trauma. The wound is located on the Right,Lateral Lower Leg. The  wound measures 3.6cm length x 2.8cm width x 0.2cm depth; 7.917cm^2 area and 1.583cm^3 volume. There is Fat Layer (Subcutaneous Tissue) Exposed exposed. There is no tunneling or undermining noted. There is a medium amount of serous drainage noted. The wound margin is flat and intact. There is medium (34-66%) pink granulation within the wound bed. There is a medium (34-66%) amount of necrotic tissue within the wound bed including Eschar and Adherent Slough. Assessment Active Problems ICD-10 [name], [name]. ([id number]) Unspecified open wound, right lower leg, initial encounter Non-pressure chronic ulcer of other part of right lower leg with fat layer exposed Cellulitis of right lower limb Essential (primary) hypertension Procedures Wound #1 Pre-procedure diagnosis of Wound #  1 is a Trauma, Other located on the Right,Lateral Lower Leg . There was a Excisional Skin/Subcutaneous Tissue Debridement with a total area of 10.08 sq cm performed by STONE III, Esmee Fallaw E., PA-C. With the following instrument(s): Forceps, and Scissors to remove Viable and Non-Viable tissue/material. Material removed includes Subcutaneous Tissue and Skin: Dermis and after achieving pain control using Lidocaine. A time out was conducted at 09:37, prior to the start of the procedure. A Minimum amount of bleeding was controlled with Pressure. The procedure was tolerated well. Post Debridement Measurements: 3.6cm length x 2.8cm width x 0.2cm depth; 1.583cm^3 volume. Character of Wound/Ulcer Post Debridement is stable. Post procedure Diagnosis Wound #1: Same as Pre-Procedure Plan Wound Cleansing: Wound #1 Right,Lateral Lower Leg: Clean wound with Normal Saline. - in office Dial antibacterial soap, wash wounds, rinse and pat dry prior to dressing wounds Primary Wound Dressing: Wound #1 Right,Lateral Lower Leg: Silver Alginate Secondary Dressing: ABD and Kerlix/Conform Dressing Change Frequency: Wound #1 Right,Lateral Lower  Leg: Change dressing every other day. Follow-up Appointments: Wound #1 Right,Lateral Lower Leg: Return Appointment in 1 week. Edema Control: Wound #1 Right,Lateral Lower Leg: Elevate legs to the level of the heart and pump ankles as often as possible Other: - tubi grip 1. My suggestion currently is can be that we go ahead and initiate treatment with a continuation of the silver alginate dressing. We may switch to something different in the future but I still believe this may be best based on the amount of drainage she is experiencing. 2. I am also can recommend continuation of the Tubigrip which does seem to be beneficial for her. 3. We will continue to monitor for any signs of infection the right now she seems to be doing well in that regard I do not see any evidence of infection currently. We will see patient back for reevaluation in 1 week here in the clinic. If anything worsens or changes patient will contact our office for additional recommendations. Electronic Signature(s) Signed: 05/25/2019 4:40:38 PM By: Lenda Kelp PA-C Entered By: Lenda Kelp on 05/25/2019 16:40:37 [name], [name] ([id number]) -------------------------------------------------------------------------------- SuperBill Details Patient Name: [name]. Date of Service: 05/25/2019 Medical Record Number: [id number] Patient Account Number: [id number] Date of Birth/Sex: [birth year] (69 y.o. F) Treating RN: Rodell Perna Primary Care Provider: Joycelyn Man Other Clinician: Referring Provider: Joycelyn Man Treating Provider/Extender: Linwood Dibbles, Niraj Kudrna Weeks in Treatment: 1 Diagnosis Coding ICD-10 Codes Code Description [phone] Unspecified open wound, right lower leg, initial encounter L97.812 Non-pressure chronic ulcer of other part of right lower leg with fat layer exposed L03.115 Cellulitis of right lower limb I10 Essential (primary) hypertension Facility Procedures CPT4 Code:  [id number] Description: 11042 - DEB SUBQ TISSUE 20 SQ CM/< Modifier: Quantity: 1 CPT4 Code: Description: ICD-10 Diagnosis Description S81.801A Unspecified open wound, right lower leg, initial encounter L97.812 Non-pressure chronic ulcer of other part of right lower leg with fat lay Modifier: er exposed Quantity: Physician Procedures CPT4 Code: [id number] Description: 11042 - WC PHYS SUBQ TISS 20 SQ CM Modifier: Quantity: 1 CPT4 Code: Description: ICD-10 Diagnosis Description S81.801A Unspecified open wound, right lower leg, initial encounter L97.812 Non-pressure chronic ulcer of other part of right lower leg with fat lay Modifier: er exposed Quantity: Electronic Signature(s) Signed: 05/25/2019 4:41:44 PM By: Lenda Kelp PA-C Entered By: Lenda Kelp on 05/25/2019 16:41:44

## 2019-05-25 NOTE — Progress Notes (Addendum)
JESENIA, SPERA ([id number]) Visit Report for 05/25/2019 Arrival Information Details Patient Name: [name], [name]. Date of Service: 05/25/2019 9:15 AM Medical Record Number: [id number] Patient Account Number: [id number] Date of Birth/Sex: 09/02/1950 (68 y.o. F) Treating RN: Curtis Sites Primary Care Catheline Hixon: Joycelyn Man Other Clinician: Referring Lisette Mancebo: Joycelyn Man Treating Marjani Kobel/Extender: Linwood Dibbles, HOYT Weeks in Treatment: 1 Visit Information History Since Last Visit Added or deleted any medications: No Patient Arrived: Ambulatory Any new allergies or adverse reactions: No Arrival Time: 09:21 Had a fall or experienced change in No Accompanied By: self activities of daily living that may affect Transfer Assistance: None risk of falls: Patient Identification Verified: Yes Signs or symptoms of abuse/neglect since last visito No Secondary Verification Process Completed: Yes Hospitalized since last visit: No Implantable device outside of the clinic excluding No cellular tissue based products placed in the center since last visit: Has Dressing in Place as Prescribed: Yes Has Compression in Place as Prescribed: Yes Pain Present Now: No Electronic Signature(s) Signed: 05/25/2019 4:19:43 PM By: Curtis Sites Entered By: Curtis Sites on 05/25/2019 09:23:01 [name] ([id number]) -------------------------------------------------------------------------------- Encounter Discharge Information Details Patient Name: [name]. Date of Service: 05/25/2019 9:15 AM Medical Record Number: [id number] Patient Account Number: [id number] Date of Birth/Sex: 1950-11-17 (68 y.o. F) Treating RN: Rodell Perna Primary Care Kayleb Warshaw: Joycelyn Man Other Clinician: Referring Laurali Goddard: Joycelyn Man Treating Carly Sabo/Extender: Linwood Dibbles, HOYT Weeks in Treatment: 1 Encounter Discharge Information Items Post Procedure Vitals Discharge Condition: Stable Temperature (F):  97.7 Ambulatory Status: Ambulatory Pulse (bpm): 68 Discharge Destination: Home Respiratory Rate (breaths/min): 16 Transportation: Private Auto Blood Pressure (mmHg): 160/72 Accompanied By: self Schedule Follow-up Appointment: Yes Clinical Summary of Care: Electronic Signature(s) Signed: 05/25/2019 11:36:51 AM By: Rodell Perna Entered By: Rodell Perna on 05/25/2019 09:44:57 [name], Laura M. ([id number]) -------------------------------------------------------------------------------- Lower Extremity Assessment Details Patient Name: [name]. Date of Service: 05/25/2019 9:15 AM Medical Record Number: [id number] Patient Account Number: [id number] Date of Birth/Sex: [birth year] (68 y.o. F) Treating RN: Curtis Sites Primary Care Lynsey Ange: Joycelyn Man Other Clinician: Referring Laiana Fratus: Joycelyn Man Treating Aizah Gehlhausen/Extender: STONE III, HOYT Weeks in Treatment: 1 Edema Assessment Assessed: [Left: No] [Right: No] Edema: [Left: N] [Right: o] Vascular Assessment Pulses: Dorsalis Pedis Palpable: [Right:Yes] Electronic Signature(s) Signed: 05/25/2019 4:19:43 PM By: Curtis Sites Entered By: Curtis Sites on 05/25/2019 09:27:09 [name], [name] ([id number]) -------------------------------------------------------------------------------- Multi Wound Chart Details Patient Name: [name]. Date of Service: 05/25/2019 9:15 AM Medical Record Number: [id number] Patient Account Number: [id number] Date of Birth/Sex: 05-12-1950 (68 y.o. F) Treating RN: Rodell Perna Primary Care Yanelie Abraha: Joycelyn Man Other Clinician: Referring Beverlee Wilmarth: Joycelyn Man Treating Kristian Hazzard/Extender: Linwood Dibbles, HOYT Weeks in Treatment: 1 Vital Signs Height(in): 66 Pulse(bpm): 68 Weight(lbs): 130 Blood Pressure(mmHg): 160/72 Body Mass Index(BMI): 21 Temperature(F): 97.7 Respiratory Rate(breaths/min): 16 Photos: [N/A:N/A] Wound Location: Right, Lateral Lower Leg N/A N/A Wounding  Event: Trauma N/A N/A Primary Etiology: Trauma, Other N/A N/A Comorbid History: Hypertension N/A N/A Date Acquired: 05/10/2019 N/A N/A Weeks of Treatment: 1 N/A N/A Wound Status: Open N/A N/A Measurements L x W x D (cm) 3.6x2.8x0.2 N/A N/A Area (cm) : 7.917 N/A N/A Volume (cm) : 1.583 N/A N/A % Reduction in Area: 1.20% N/A N/A % Reduction in Volume: 34.10% N/A N/A Classification: Full Thickness Without Exposed N/A N/A Support Structures Exudate Amount: Medium N/A N/A Exudate Type: Serous N/A N/A Exudate Color: amber N/A N/A Wound Margin: Flat and Intact N/A N/A Granulation Amount: Medium (34-66%) N/A N/A Granulation Quality: Pink N/A N/A  Necrotic Amount: Medium (34-66%) N/A N/A Necrotic Tissue: Eschar, Adherent Slough N/A N/A Exposed Structures: Fat Layer (Subcutaneous Tissue) N/A N/A Exposed: Yes Fascia: No Tendon: No Muscle: No Joint: No Bone: No Epithelialization: None N/A N/A Treatment Notes Electronic Signature(s) Signed: 05/25/2019 11:36:51 AM By: Rodell Perna Entered By: Rodell Perna on 05/25/2019 09:38:39 [name], [name] ([id number]) -------------------------------------------------------------------------------- Multi-Disciplinary Care Plan Details Patient Name: [name]. Date of Service: 05/25/2019 9:15 AM Medical Record Number: [id number] Patient Account Number: [id number] Date of Birth/Sex: 05-07-1950 (68 y.o. F) Treating RN: Rodell Perna Primary Care Catera Hankins: Joycelyn Man Other Clinician: Referring Anvika Gashi: Joycelyn Man Treating Shiela Bruns/Extender: Linwood Dibbles, HOYT Weeks in Treatment: 1 Active Inactive Orientation to the Wound Care Program Nursing Diagnoses: Knowledge deficit related to the wound healing center program Goals: Patient/caregiver will verbalize understanding of the Wound Healing Center Program Date Initiated: 05/18/2019 Target Resolution Date: 05/26/2019 Goal Status: Active Interventions: Provide education on orientation to the  wound center Notes: Wound/Skin Impairment Nursing Diagnoses: Impaired tissue integrity Goals: Ulcer/skin breakdown will have a volume reduction of 30% by week 4 Date Initiated: 05/18/2019 Target Resolution Date: 06/15/2019 Goal Status: Active Interventions: Assess ulceration(s) every visit Notes: Electronic Signature(s) Signed: 05/25/2019 11:36:51 AM By: Rodell Perna Entered By: Rodell Perna on 05/25/2019 09:38:32 [name], Laura M. ([id number]) -------------------------------------------------------------------------------- Pain Assessment Details Patient Name: [name]. Date of Service: 05/25/2019 9:15 AM Medical Record Number: [id number] Patient Account Number: [id number] Date of Birth/Sex: Jan 02, 1951 (68 y.o. F) Treating RN: Curtis Sites Primary Care Annemarie Sebree: Joycelyn Man Other Clinician: Referring Aima Mcwhirt: Joycelyn Man Treating Sunny Gains/Extender: Linwood Dibbles, HOYT Weeks in Treatment: 1 Active Problems Location of Pain Severity and Description of Pain Patient Has Paino Yes Site Locations Pain Location: Pain in Ulcers With Dressing Change: Yes Pain Management and Medication Current Pain Management: Electronic Signature(s) Signed: 05/25/2019 4:19:43 PM By: Curtis Sites Entered By: Curtis Sites on 05/25/2019 09:24:34 [name] ([id number]) -------------------------------------------------------------------------------- Patient/Caregiver Education Details Patient Name: [name]. Date of Service: 05/25/2019 9:15 AM Medical Record Number: [id number] Patient Account Number: [id number] Date of Birth/Gender: January 25, 1950 (68 y.o. F) Treating RN: Rodell Perna Primary Care Physician: Joycelyn Man Other Clinician: Referring Physician: Joycelyn Man Treating Physician/Extender: Skeet Simmer in Treatment: 1 Education Assessment Education Provided To: Patient Education Topics Provided Wound/Skin Impairment: Handouts: Caring for Your  Ulcer Methods: Demonstration, Explain/Verbal Responses: State content correctly Electronic Signature(s) Signed: 05/25/2019 11:36:51 AM By: Rodell Perna Entered By: Rodell Perna on 05/25/2019 09:44:25 [name], Laura M. ([id number]) -------------------------------------------------------------------------------- Wound Assessment Details Patient Name: [name]. Date of Service: 05/25/2019 9:15 AM Medical Record Number: [id number] Patient Account Number: [id number] Date of Birth/Sex: [birth year] (68 y.o. F) Treating RN: Curtis Sites Primary Care Nico Rogness: Joycelyn Man Other Clinician: Referring Stephani Janak: Joycelyn Man Treating Navaeh Kehres/Extender: Linwood Dibbles, HOYT Weeks in Treatment: 1 Wound Status Wound Number: 1 Primary Etiology: Trauma, Other Wound Location: Right, Lateral Lower Leg Wound Status: Open Wounding Event: Trauma Comorbid History: Hypertension Date Acquired: 05/10/2019 Weeks Of Treatment: 1 Clustered Wound: No Photos Wound Measurements Length: (cm) 3.6 Width: (cm) 2.8 Depth: (cm) 0.2 Area: (cm) 7.917 Volume: (cm) 1.583 % Reduction in Area: 1.2% % Reduction in Volume: 34.1% Epithelialization: None Tunneling: No Undermining: No Wound Description Classification: Full Thickness Without Exposed Support Structu Wound Margin: Flat and Intact Exudate Amount: Medium Exudate Type: Serous Exudate Color: amber res Foul Odor After Cleansing: No Slough/Fibrino Yes Wound Bed Granulation Amount: Medium (34-66%) Exposed Structure Granulation Quality: Pink Fascia Exposed: No Necrotic Amount: Medium (34-66%) Fat Layer (Subcutaneous Tissue)  Exposed: Yes Necrotic Quality: Eschar, Adherent Slough Tendon Exposed: No Muscle Exposed: No Joint Exposed: No Bone Exposed: No Treatment Notes Wound #1 (Right, Lateral Lower Leg) Notes scell, ABD, conform, tubi F Electronic Signature(s) Signed: 05/25/2019 4:19:43 PM By: Alfonse Alpers ([id number]) Entered  By: Montey Hora on 05/25/2019 09:28:33 [name] ([id number]) -------------------------------------------------------------------------------- Vitals Details Patient Name: [name]. Date of Service: 05/25/2019 9:15 AM Medical Record Number: [id number] Patient Account Number: [id number] Date of Birth/Sex: Jun 07, 1950 (68 y.o. F) Treating RN: Montey Hora Primary Care Deonta Bomberger: Fenton Malling Other Clinician: Referring Treniyah Lynn: Fenton Malling Treating Santi Troung/Extender: Melburn Hake, HOYT Weeks in Treatment: 1 Vital Signs Time Taken: 09:24 Temperature (F): 97.7 Height (in): 66 Pulse (bpm): 68 Weight (lbs): 130 Respiratory Rate (breaths/min): 16 Body Mass Index (BMI): 21 Blood Pressure (mmHg): 160/72 Reference Range: 80 - 120 mg / dl Electronic Signature(s) Signed: 05/25/2019 4:19:43 PM By: Montey Hora Entered By: Montey Hora on 05/25/2019 11:94:17

## 2019-06-01 ENCOUNTER — Other Ambulatory Visit: Payer: Self-pay

## 2019-06-01 ENCOUNTER — Encounter: Payer: Medicare Other | Admitting: Internal Medicine

## 2019-06-01 DIAGNOSIS — L97812 Non-pressure chronic ulcer of other part of right lower leg with fat layer exposed: Secondary | ICD-10-CM | POA: Diagnosis not present

## 2019-06-02 NOTE — Progress Notes (Signed)
[name], [name] ([id number]) Visit Report for 06/01/2019 Arrival Information Details Patient Name: [name], [name]. Date of Service: 06/01/2019 9:15 AM Medical Record Number: [id number] Patient Account Number: [id number] Date of Birth/Sex: [birth year] (68 y.o. F) Treating RN: Montey Hora Primary Care Saher Davee: Fenton Malling Other Clinician: Referring Keanna Tugwell: Fenton Malling Treating Alliya Marcon/Extender: Tito Dine in Treatment: 2 Visit Information History Since Last Visit Added or deleted any medications: No Patient Arrived: Ambulatory Any new allergies or adverse reactions: No Arrival Time: 09:25 Had a fall or experienced change in No Accompanied By: self activities of daily living that may affect Transfer Assistance: None risk of falls: Patient Identification Verified: Yes Signs or symptoms of abuse/neglect since last visito No Secondary Verification Process Completed: Yes Hospitalized since last visit: No Implantable device outside of the clinic excluding No cellular tissue based products placed in the center since last visit: Has Dressing in Place as Prescribed: Yes Has Compression in Place as Prescribed: Yes Pain Present Now: No Electronic Signature(s) Signed: 06/01/2019 5:03:46 PM By: Montey Hora Entered By: Montey Hora on 06/01/2019 09:26:11 [name] ([id number]) -------------------------------------------------------------------------------- Encounter Discharge Information Details Patient Name: [name]. Date of Service: 06/01/2019 9:15 AM Medical Record Number: [id number] Patient Account Number: [id number] Date of Birth/Sex: 26-May-1950 (68 y.o. F) Treating RN: Army Melia Primary Care Noemi Bellissimo: Fenton Malling Other Clinician: Referring Jazper Nikolai: Fenton Malling Treating Aiman Noe/Extender: Tito Dine in Treatment: 2 Encounter Discharge Information Items Post Procedure Vitals Discharge Condition:  Stable Temperature (F): 97.5 Ambulatory Status: Ambulatory Pulse (bpm): 86 Discharge Destination: Home Respiratory Rate (breaths/min): 16 Transportation: Private Auto Blood Pressure (mmHg): 167/80 Accompanied By: self Schedule Follow-up Appointment: Yes Clinical Summary of Care: Electronic Signature(s) Signed: 06/01/2019 11:19:24 AM By: Army Melia Entered By: Army Melia on 06/01/2019 09:48:00 Favaro, [name] ([id number]) -------------------------------------------------------------------------------- Lower Extremity Assessment Details Patient Name: [name]. Date of Service: 06/01/2019 9:15 AM Medical Record Number: [id number] Patient Account Number: [id number] Date of Birth/Sex: [birth year] (68 y.o. F) Treating RN: Montey Hora Primary Care Jalise Zawistowski: Fenton Malling Other Clinician: Referring Roshunda Keir: Fenton Malling Treating Samamtha Tiegs/Extender: Ricard Dillon Weeks in Treatment: 2 Edema Assessment Assessed: [Left: No] [Right: No] Edema: [Left: N] [Right: o] Vascular Assessment Pulses: Dorsalis Pedis Palpable: [Right:Yes] Electronic Signature(s) Signed: 06/01/2019 5:03:46 PM By: Montey Hora Entered By: Montey Hora on 06/01/2019 09:33:00 Wendland, [name] ([id number]) -------------------------------------------------------------------------------- Multi Wound Chart Details Patient Name: [name]. Date of Service: 06/01/2019 9:15 AM Medical Record Number: [id number] Patient Account Number: [id number] Date of Birth/Sex: [birth year] (68 y.o. F) Treating RN: Army Melia Primary Care Rory Montel: Fenton Malling Other Clinician: Referring Kanae Ignatowski: Fenton Malling Treating Kalif Kattner/Extender: Tito Dine in Treatment: 2 Vital Signs Height(in): 66 Pulse(bpm): 7 Weight(lbs): 130 Blood Pressure(mmHg): 167/80 Body Mass Index(BMI): 21 Temperature(F): 97.5 Respiratory Rate(breaths/min): 16 Photos: [N/A:N/A] Wound Location: Right, Lateral  Lower Leg N/A N/A Wounding Event: Trauma N/A N/A Primary Etiology: Trauma, Other N/A N/A Comorbid History: Hypertension N/A N/A Date Acquired: 05/10/2019 N/A N/A Weeks of Treatment: 2 N/A N/A Wound Status: Open N/A N/A Measurements L x W x D (cm) 3.4x3.1x0.2 N/A N/A Area (cm) : 8.278 N/A N/A Volume (cm) : 1.656 N/A N/A % Reduction in Area: -3.30% N/A N/A % Reduction in Volume: 31.10% N/A N/A Classification: Full Thickness Without Exposed N/A N/A Support Structures Exudate Amount: Medium N/A N/A Exudate Type: Serous N/A N/A Exudate Color: amber N/A N/A Wound Margin: Flat and Intact N/A N/A Granulation Amount: Large (67-100%) N/A N/A Granulation Quality: Pink N/A N/A  Necrotic Amount: Small (1-33%) N/A N/A Exposed Structures: Fat Layer (Subcutaneous Tissue) N/A N/A Exposed: Yes Fascia: No Tendon: No Muscle: No Joint: No Bone: No Epithelialization: None N/A N/A Debridement: Debridement - Excisional N/A N/A Pre-procedure Verification/Time 09:43 N/A N/A Out Taken: Pain Control: Lidocaine N/A N/A Tissue Debrided: Subcutaneous, Slough N/A N/A Level: Skin/Subcutaneous Tissue N/A N/A Debridement Area (sq cm): 10.54 N/A N/A Instrument: Curette N/A N/A Bleeding: Minimum N/A N/A Hemostasis Achieved: Pressure N/A N/A Debridement Treatment Procedure was tolerated well N/A N/A Response: 3.4x3.1x0.2 N/A N/A [name], [name]. ([id number]) Post Debridement Measurements L x W x D (cm) Post Debridement Volume: 1.656 N/A N/A (cm) Procedures Performed: Debridement N/A N/A Treatment Notes Wound #1 (Right, Lateral Lower Leg) Notes Hblue ABD, conform, tubi F Electronic Signature(s) Signed: 06/01/2019 5:28:47 PM By: Baltazar Najjar MD Entered By: Baltazar Najjar on 06/01/2019 09:52:33 [name] ([id number]) -------------------------------------------------------------------------------- Multi-Disciplinary Care Plan Details Patient Name: [name], [name]. Date of Service: 06/01/2019 9:15  AM Medical Record Number: [id number] Patient Account Number: [id number] Date of Birth/Sex: 1950-08-19 (68 y.o. F) Treating RN: Rodell Perna Primary Care Usbaldo Pannone: Joycelyn Man Other Clinician: Referring Darly Massi: Joycelyn Man Treating Obie Kallenbach/Extender: Altamese [place] in Treatment: 2 Active Inactive Orientation to the Wound Care Program Nursing Diagnoses: Knowledge deficit related to the wound healing center program Goals: Patient/caregiver will verbalize understanding of the Wound Healing Center Program Date Initiated: 05/18/2019 Target Resolution Date: 05/26/2019 Goal Status: Active Interventions: Provide education on orientation to the wound center Notes: Wound/Skin Impairment Nursing Diagnoses: Impaired tissue integrity Goals: Ulcer/skin breakdown will have a volume reduction of 30% by week 4 Date Initiated: 05/18/2019 Target Resolution Date: 06/15/2019 Goal Status: Active Interventions: Assess ulceration(s) every visit Notes: Electronic Signature(s) Signed: 06/01/2019 11:19:24 AM By: Rodell Perna Entered By: Rodell Perna on 06/01/2019 09:40:41 [name], Laura M. ([id number]) -------------------------------------------------------------------------------- Pain Assessment Details Patient Name: [name]. Date of Service: 06/01/2019 9:15 AM Medical Record Number: [id number] Patient Account Number: [id number] Date of Birth/Sex: [birth year] (68 y.o. F) Treating RN: Curtis Sites Primary Care Helga Asbury: Joycelyn Man Other Clinician: Referring Fallon Haecker: Joycelyn Man Treating Bailen Geffre/Extender: Altamese [place] in Treatment: 2 Active Problems Location of Pain Severity and Description of Pain Patient Has Paino No Site Locations Pain Management and Medication Current Pain Management: Electronic Signature(s) Signed: 06/01/2019 5:03:46 PM By: Curtis Sites Entered By: Curtis Sites on 06/01/2019 09:26:20 [name]  ([id number]) -------------------------------------------------------------------------------- Patient/Caregiver Education Details Patient Name: [name]. Date of Service: 06/01/2019 9:15 AM Medical Record Number: [id number] Patient Account Number: [id number] Date of Birth/Gender: 10/13/1950 (68 y.o. F) Treating RN: Rodell Perna Primary Care Physician: Joycelyn Man Other Clinician: Referring Physician: Joycelyn Man Treating Physician/Extender: Altamese Franquez in Treatment: 2 Education Assessment Education Provided To: Patient Education Topics Provided Wound/Skin Impairment: Handouts: Caring for Your Ulcer Methods: Demonstration, Explain/Verbal Responses: State content correctly Electronic Signature(s) Signed: 06/01/2019 11:19:24 AM By: Rodell Perna Entered By: Rodell Perna on 06/01/2019 09:44:30 Lunt, Cira M. ([id number]) -------------------------------------------------------------------------------- Wound Assessment Details Patient Name: [name]. Date of Service: 06/01/2019 9:15 AM Medical Record Number: [id number] Patient Account Number: [id number] Date of Birth/Sex: 1950-06-19 (68 y.o. F) Treating RN: Curtis Sites Primary Care Aleathea Pugmire: Joycelyn Man Other Clinician: Referring Rodriquez Thorner: Joycelyn Man Treating Dashiel Bergquist/Extender: Altamese [place] in Treatment: 2 Wound Status Wound Number: 1 Primary Etiology: Trauma, Other Wound Location: Right, Lateral Lower Leg Wound Status: Open Wounding Event: Trauma Comorbid History: Hypertension Date Acquired: 05/10/2019 Weeks Of Treatment: 2 Clustered Wound: No Photos Wound Measurements Length: (  cm) 3.4 Width: (cm) 3.1 Depth: (cm) 0.2 Area: (cm) 8.278 Volume: (cm) 1.656 % Reduction in Area: -3.3% % Reduction in Volume: 31.1% Epithelialization: None Tunneling: No Undermining: No Wound Description Classification: Full Thickness Without Exposed Support Structu Wound Margin: Flat  and Intact Exudate Amount: Medium Exudate Type: Serous Exudate Color: amber res Foul Odor After Cleansing: No Slough/Fibrino Yes Wound Bed Granulation Amount: Large (67-100%) Exposed Structure Granulation Quality: Pink Fascia Exposed: No Necrotic Amount: Small (1-33%) Fat Layer (Subcutaneous Tissue) Exposed: Yes Necrotic Quality: Adherent Slough Tendon Exposed: No Muscle Exposed: No Joint Exposed: No Bone Exposed: No Treatment Notes Wound #1 (Right, Lateral Lower Leg) Notes Hblue ABD, conform, tubi F Electronic Signature(s) Signed: 06/01/2019 5:03:46 PM By: Garlan Fillers ([id number]) Entered By: Curtis Sites on 06/01/2019 09:32:45 [name] ([id number]) -------------------------------------------------------------------------------- Vitals Details Patient Name: [name]. Date of Service: 06/01/2019 9:15 AM Medical Record Number: [id number] Patient Account Number: [id number] Date of Birth/Sex: 02/28/1950 (68 y.o. F) Treating RN: Curtis Sites Primary Care Amay Mijangos: Joycelyn Man Other Clinician: Referring Raynard Mapps: Joycelyn Man Treating Arlis Yale/Extender: Altamese Beg[place] in Treatment: 2 Vital Signs Time Taken: 09:26 Temperature (F): 97.5 Height (in): 66 Pulse (bpm): 86 Weight (lbs): 130 Respiratory Rate (breaths/min): 16 Body Mass Index (BMI): 21 Blood Pressure (mmHg): 167/80 Reference Range: 80 - 120 mg / dl Electronic Signature(s) Signed: 06/01/2019 5:03:46 PM By: Curtis Sites Entered By: Curtis Sites on 06/01/2019 09:27:51

## 2019-06-02 NOTE — Progress Notes (Signed)
MERRIDY, PASCOE ([id number]) Visit Report for 06/01/2019 Debridement Details Patient Name: [name], [name]. Date of Service: 06/01/2019 9:15 AM Medical Record Number: [id number] Patient Account Number: [id number] Date of Birth/Sex: Jul 14, 1950 (68 y.o. F) Treating RN: Rodell Perna Primary Care Provider: Joycelyn Man Other Clinician: Referring Provider: Joycelyn Man Treating Provider/Extender: Altamese [place] in Treatment: 2 Debridement Performed for Wound #1 Right,Lateral Lower Leg Assessment: Performed By: Physician Maxwell Caul, MD Debridement Type: Debridement Level of Consciousness (Pre- Awake and Alert procedure): Pre-procedure Verification/Time Out Yes - 09:43 Taken: Start Time: 09:43 Pain Control: Lidocaine Total Area Debrided (L x W): 3.4 (cm) x 3.1 (cm) = 10.54 (cm) Tissue and other material Viable, Non-Viable, Slough, Subcutaneous, Slough debrided: Level: Skin/Subcutaneous Tissue Debridement Description: Excisional Instrument: Curette Bleeding: Minimum Hemostasis Achieved: Pressure End Time: 09:44 Response to Treatment: Procedure was tolerated well Level of Consciousness (Post- Awake and Alert procedure): Post Debridement Measurements of Total Wound Length: (cm) 3.4 Width: (cm) 3.1 Depth: (cm) 0.2 Volume: (cm) 1.656 Character of Wound/Ulcer Post Debridement: Stable Post Procedure Diagnosis Same as Pre-procedure Electronic Signature(s) Signed: 06/01/2019 11:19:24 AM By: Rodell Perna Signed: 06/01/2019 5:28:47 PM By: Baltazar Najjar MD Entered By: Baltazar Najjar on 06/01/2019 09:52:43 [name] ([id number]) -------------------------------------------------------------------------------- HPI Details Patient Name: [name]. Date of Service: 06/01/2019 9:15 AM Medical Record Number: [id number] Patient Account Number: [id number] Date of Birth/Sex: 1950-06-15 (68 y.o. F) Treating RN: Rodell Perna Primary Care Provider: Joycelyn Man Other Clinician: Referring Provider: Joycelyn Man Treating Provider/Extender: Altamese [place] in Treatment: 2 History of Present Illness HPI Description: 05/19/2019 upon evaluation today patient presents for initial inspection here in our clinic concerning an injury that she has to the right lateral lower extremity. She does have associated cellulitis as well. Unfortunately she is continuing to have some discomfort at this point. She has been seen and prescribed doxycycline which she is currently taking that is excellent news. With that being said she fortunately does not appear to have any significant edema of the lower extremities at this point other than right around the wound area I do not think she has venous stasis which should be good for her as far as healing is concerned she is less likely to have complications. We still may want to use something such as Tubigrip in order to help with just some of the local edema at this time. The patient is in agreement with that plan. I am also can have to likely perform some debridement to clear away some of the necrotic tissue currently as far as the wound surface is concerned. The patient has a history of hypertension but otherwise has no major medical problems. 05/25/2019 upon evaluation today patient appears to be doing roughly the same with regard to her wound. Unfortunately the skin flap that I was hoping would survive does not appear to have. This is going to require debridement in order to clear this away so that the wound can heal appropriately. That was discussed with the patient today and she understands and was completely expecting that based on how it looked as well. I would kind of forewarned her last week. 5/13; patient with a traumatic wound on the right lateral calf area. [Traumatized on [employer] door]. She has been using silver alginate. This was apparently complicated by cellulitis initially. Not currently on  antibiotics. Electronic Signature(s) Signed: 06/01/2019 5:28:47 PM By: Baltazar Najjar MD Entered By: Baltazar Najjar on 06/01/2019 09:53:21 [name] ([id number]) -------------------------------------------------------------------------------- Physical Exam  Details Patient Name: [name], [name]. Date of Service: 06/01/2019 9:15 AM Medical Record Number: [id number] Patient Account Number: [id number] Date of Birth/Sex: Jun 10, 1950 (68 y.o. F) Treating RN: Rodell Perna Primary Care Provider: Joycelyn Man Other Clinician: Referring Provider: Joycelyn Man Treating Provider/Extender: Altamese [place] in Treatment: 2 Constitutional Patient is hypertensive.. Pulse regular and within target range for patient.Marland Kitchen Respirations regular, non-labored and within target range.. Temperature is normal and within the target range for the patient.Marland Kitchen appears in no distress. Notes Wound exam; the patient has a rim of epithelialization however she required debridement with a #5 curette to remove very fibrinous necrotic debris from the wound surface hemostasis with direct pressure there is no evidence of infection Electronic Signature(s) Signed: 06/01/2019 5:28:47 PM By: Baltazar Najjar MD Entered By: Baltazar Najjar on 06/01/2019 09:54:11 [name] ([id number]) -------------------------------------------------------------------------------- Physician Orders Details Patient Name: [name]. Date of Service: 06/01/2019 9:15 AM Medical Record Number: [id number] Patient Account Number: [id number] Date of Birth/Sex: 1950-07-22 (68 y.o. F) Treating RN: Rodell Perna Primary Care Provider: Joycelyn Man Other Clinician: Referring Provider: Joycelyn Man Treating Provider/Extender: Altamese [place] in Treatment: 2 Verbal / Phone Orders: No Diagnosis Coding Wound Cleansing Wound #1 Right,Lateral Lower Leg o Clean wound with Normal Saline. - in office o Dial  antibacterial soap, wash wounds, rinse and pat dry prior to dressing wounds Primary Wound Dressing Wound #1 Right,Lateral Lower Leg o Hydrafera Blue Ready Transfer Secondary Dressing o ABD and Kerlix/Conform Dressing Change Frequency Wound #1 Right,Lateral Lower Leg o Change dressing every other day. Follow-up Appointments Wound #1 Right,Lateral Lower Leg o Return Appointment in 1 week. Edema Control Wound #1 Right,Lateral Lower Leg o Elevate legs to the level of the heart and pump ankles as often as possible o Other: - tubi grip Electronic Signature(s) Signed: 06/01/2019 11:19:24 AM By: Rodell Perna Signed: 06/01/2019 5:28:47 PM By: Baltazar Najjar MD Entered By: Rodell Perna on 06/01/2019 09:46:52 Domzalski, Orlin Hilding ([id number]) -------------------------------------------------------------------------------- Problem List Details Patient Name: [name], [name]. Date of Service: 06/01/2019 9:15 AM Medical Record Number: [id number] Patient Account Number: [id number] Date of Birth/Sex: Aug 16, 1950 (68 y.o. F) Treating RN: Rodell Perna Primary Care Provider: Joycelyn Man Other Clinician: Referring Provider: Joycelyn Man Treating Provider/Extender: Altamese [place] in Treatment: 2 Active Problems ICD-10 Encounter Code Description Active Date MDM Diagnosis S81.801A Unspecified open wound, right lower leg, initial encounter 05/18/2019 No Yes L97.812 Non-pressure chronic ulcer of other part of right lower leg with fat layer 05/18/2019 No Yes exposed L03.115 Cellulitis of right lower limb 05/18/2019 No Yes I10 Essential (primary) hypertension 05/18/2019 No Yes Inactive Problems Resolved Problems Electronic Signature(s) Signed: 06/01/2019 5:28:47 PM By: Baltazar Najjar MD Entered By: Baltazar Najjar on 06/01/2019 09:52:24 [name] ([id number]) -------------------------------------------------------------------------------- Progress Note Details Patient Name:  [name]. Date of Service: 06/01/2019 9:15 AM Medical Record Number: [id number] Patient Account Number: [id number] Date of Birth/Sex: February 27, 1950 (68 y.o. F) Treating RN: Rodell Perna Primary Care Provider: Joycelyn Man Other Clinician: Referring Provider: Joycelyn Man Treating Provider/Extender: Altamese [place] in Treatment: 2 Subjective History of Present Illness (HPI) 05/19/2019 upon evaluation today patient presents for initial inspection here in our clinic concerning an injury that she has to the right lateral lower extremity. She does have associated cellulitis as well. Unfortunately she is continuing to have some discomfort at this point. She has been seen and prescribed doxycycline which she is currently taking that is excellent news. With that being said she fortunately does  not appear to have any significant edema of the lower extremities at this point other than right around the wound area I do not think she has venous stasis which should be good for her as far as healing is concerned she is less likely to have complications. We still may want to use something such as Tubigrip in order to help with just some of the local edema at this time. The patient is in agreement with that plan. I am also can have to likely perform some debridement to clear away some of the necrotic tissue currently as far as the wound surface is concerned. The patient has a history of hypertension but otherwise has no major medical problems. 05/25/2019 upon evaluation today patient appears to be doing roughly the same with regard to her wound. Unfortunately the skin flap that I was hoping would survive does not appear to have. This is going to require debridement in order to clear this away so that the wound can heal appropriately. That was discussed with the patient today and she understands and was completely expecting that based on how it looked as well. I would kind of forewarned her last  week. 5/13; patient with a traumatic wound on the right lateral calf area. [Traumatized on [employer] door]. She has been using silver alginate. This was apparently complicated by cellulitis initially. Not currently on antibiotics. Objective Constitutional Patient is hypertensive.. Pulse regular and within target range for patient.Marland Kitchen Respirations regular, non-labored and within target range.. Temperature is normal and within the target range for the patient.Marland Kitchen appears in no distress. Vitals Time Taken: 9:26 AM, Height: 66 in, Weight: 130 lbs, BMI: 21, Temperature: 97.5 F, Pulse: 86 bpm, Respiratory Rate: 16 breaths/min, Blood Pressure: 167/80 mmHg. General Notes: Wound exam; the patient has a rim of epithelialization however she required debridement with a #5 curette to remove very fibrinous necrotic debris from the wound surface hemostasis with direct pressure there is no evidence of infection Integumentary (Hair, Skin) Wound #1 status is Open. Original cause of wound was Trauma. The wound is located on the Right,Lateral Lower Leg. The wound measures 3.4cm length x 3.1cm width x 0.2cm depth; 8.278cm^2 area and 1.656cm^3 volume. There is Fat Layer (Subcutaneous Tissue) Exposed exposed. There is no tunneling or undermining noted. There is a medium amount of serous drainage noted. The wound margin is flat and intact. There is large (67-100%) pink granulation within the wound bed. There is a small (1-33%) amount of necrotic tissue within the wound bed including Adherent Slough. Assessment Active Problems ICD-10 Unspecified open wound, right lower leg, initial encounter Non-pressure chronic ulcer of other part of right lower leg with fat layer exposed Cellulitis of right lower limb Essential (primary) hypertension [name], Laura M. ([id number]) Procedures Wound #1 Pre-procedure diagnosis of Wound #1 is a Trauma, Other located on the Right,Lateral Lower Leg . There was a Excisional  Skin/Subcutaneous Tissue Debridement with a total area of 10.54 sq cm performed by Maxwell Caul, MD. With the following instrument(s): Curette to remove Viable and Non-Viable tissue/material. Material removed includes Subcutaneous Tissue and Slough and after achieving pain control using Lidocaine. A time out was conducted at 09:43, prior to the start of the procedure. A Minimum amount of bleeding was controlled with Pressure. The procedure was tolerated well. Post Debridement Measurements: 3.4cm length x 3.1cm width x 0.2cm depth; 1.656cm^3 volume. Character of Wound/Ulcer Post Debridement is stable. Post procedure Diagnosis Wound #1: Same as Pre-Procedure Plan Wound Cleansing: Wound #1 Right,Lateral  Lower Leg: Clean wound with Normal Saline. - in office Dial antibacterial soap, wash wounds, rinse and pat dry prior to dressing wounds Primary Wound Dressing: Wound #1 Right,Lateral Lower Leg: Hydrafera Blue Ready Transfer Secondary Dressing: ABD and Kerlix/Conform Dressing Change Frequency: Wound #1 Right,Lateral Lower Leg: Change dressing every other day. Follow-up Appointments: Wound #1 Right,Lateral Lower Leg: Return Appointment in 1 week. Edema Control: Wound #1 Right,Lateral Lower Leg: Elevate legs to the level of the heart and pump ankles as often as possible Other: - tubi grip 1. Reasonably aggressive debridement for the second week in a row. 2. Related to #1 I have changed her to [facility] with ABD. She is changing this dressing herself every second day. 3. Follow-up in 1 week hopefully a better looking surface that does not require debridement. As mentioned she has Electronic Signature(s) Signed: 06/01/2019 5:28:47 PM By: Linton Ham MD Entered By: Linton Ham on 06/01/2019 09:56:37 [name] ([id number]) -------------------------------------------------------------------------------- SuperBill Details Patient Name: [name]. Date of Service:  06/01/2019 Medical Record Number: [id number] Patient Account Number: [id number] Date of Birth/Sex: 1950-11-03 (69 y.o. F) Treating RN: Army Melia Primary Care Provider: Fenton Malling Other Clinician: Referring Provider: Fenton Malling Treating Provider/Extender: Tito D[place] in Treatment: 2 Diagnosis Coding ICD-10 Codes Code Description [phone] Unspecified open wound, right lower leg, initial encounter L97.812 Non-pressure chronic ulcer of other part of right lower leg with fat layer exposed L03.115 Cellulitis of right lower limb I10 Essential (primary) hypertension Facility Procedures CPT4 Code: [id number] Description: 33295 - DEB SUBQ TISSUE 20 SQ CM/< Modifier: Quantity: 1 CPT4 Code: Description: ICD-10 Diagnosis Description J88.416 Non-pressure chronic ulcer of other part of right lower leg with fat lay Modifier: er exposed Quantity: Physician Procedures CPT4 Code: [id number] Description: 11042 - WC PHYS SUBQ TISS 20 SQ CM Modifier: Quantity: 1 CPT4 Code: Description: ICD-10 Diagnosis Description W10.932 Non-pressure chronic ulcer of other part of right lower leg with fat lay Modifier: er exposed Quantity: Electronic Signature(s) Signed: 06/01/2019 5:28:47 PM By: Linton Ham MD Entered By: Linton Ham on 06/01/2019 09:56:58

## 2019-06-03 ENCOUNTER — Ambulatory Visit: Payer: Medicare Other

## 2019-06-08 ENCOUNTER — Encounter: Payer: Medicare Other | Admitting: Internal Medicine

## 2019-06-08 ENCOUNTER — Other Ambulatory Visit: Payer: Self-pay

## 2019-06-08 DIAGNOSIS — L97812 Non-pressure chronic ulcer of other part of right lower leg with fat layer exposed: Secondary | ICD-10-CM | POA: Diagnosis not present

## 2019-06-09 ENCOUNTER — Ambulatory Visit: Payer: Medicare Other | Admitting: Physician Assistant

## 2019-06-09 NOTE — Progress Notes (Signed)
[name] ([id number]) Visit Report for 06/08/2019 Arrival Information Details Patient Name: SALEM, [name]. Date of Service: 06/08/2019 8:15 AM Medical Record Number: [id number] Patient Account Number: [id number] Date of Birth/Sex: 26-Feb-1950 (68 y.o. F) Treating RN: [name] Primary Care [name]: [name] Other Clinician: Referring [name]: [name] Treating [name]/Extender: [name] in Treatment: 3 Visit Information History Since Last Visit Added or deleted any medications: No Patient Arrived: Ambulatory Any new allergies or adverse reactions: No Arrival Time: 08:21 Had a fall or experienced change in No Accompanied By: self activities of daily living that may affect Transfer Assistance: None risk of falls: Patient Identification Verified: Yes Signs or symptoms of abuse/neglect since last visito No Secondary Verification Process Completed: Yes Hospitalized since last visit: No Implantable device outside of the clinic excluding No cellular tissue based products placed in the center since last visit: Has Dressing in Place as Prescribed: Yes Has Compression in Place as Prescribed: Yes Pain Present Now: No Electronic Signature(s) Signed: 06/08/2019 5:37:23 PM By: [name] Entered By: [name] on 06/08/2019 08:21:38 [name] ([id number]) -------------------------------------------------------------------------------- Clinic Level of Care Assessment Details Patient Name: [name]. Date of Service: 06/08/2019 8:15 AM Medical Record Number: [id number] Patient Account Number: [id number] Date of Birth/Sex: 1950/01/25 (68 y.o. F) Treating RN: [name] Primary Care [name]: [name] Other Clinician: Referring [name]: [name] Treating [name]/Extender: Laura  in Treatment: 3 Clinic Level of Care Assessment Items TOOL 4 Quantity Score []  - Use when only an EandM is  performed on FOLLOW-UP visit 0 ASSESSMENTS - Nursing Assessment / Reassessment X - Reassessment of Co-morbidities (includes updates in patient status) 1 10 X- 1 5 Reassessment of Adherence to Treatment Plan ASSESSMENTS - Wound and Skin Assessment / Reassessment X - Simple Wound Assessment / Reassessment - one wound 1 5 []  - 0 Complex Wound Assessment / Reassessment - multiple wounds []  - 0 Dermatologic / Skin Assessment (not related to wound area) ASSESSMENTS - Focused Assessment []  - Circumferential Edema Measurements - multi extremities 0 []  - 0 Nutritional Assessment / Counseling / Intervention []  - 0 Lower Extremity Assessment (monofilament, tuning fork, pulses) []  - 0 Peripheral Arterial Disease Assessment (using hand held doppler) ASSESSMENTS - Ostomy and/or Continence Assessment and Care []  - Incontinence Assessment and Management 0 []  - 0 Ostomy Care Assessment and Management (repouching, etc.) PROCESS - Coordination of Care X - Simple Patient / Family Education for ongoing care 1 15 []  - 0 Complex (extensive) Patient / Family Education for ongoing care []  - 0 Staff obtains , Records, Test Results / Process Orders []  - 0 Staff telephones HHA, Nursing Homes / Clarify orders / etc []  - 0 Routine Transfer to another Facility (non-emergent condition) []  - 0 Routine Hospital Admission (non-emergent condition) []  - 0 New Admissions / / Ordering NPWT, Apligraf, etc. []  - 0 Emergency Hospital Admission (emergent condition) X- 1 10 Simple Discharge Coordination []  - 0 Complex (extensive) Discharge Coordination PROCESS - Special Needs []  - Pediatric / Minor Patient Management 0 []  - 0 Isolation Patient Management []  - 0 Hearing / Language / Visual special needs []  - 0 Assessment of Community assistance (transportation, D/C planning, etc.) []  - 0 Additional assistance / Altered mentation []  - 0 Support Surface(s) Assessment (bed,  cushion, seat, etc.) INTERVENTIONS - Wound Cleansing / Measurement Moss, Laura M. ( ) X- 1 5 Simple Wound Cleansing - one wound []  - 0 Complex Wound Cleansing - multiple  wounds X- 1 5 Wound Imaging (photographs - any number of wounds) []  - 0 Wound Tracing (instead of photographs) X- 1 5 Simple Wound Measurement - one wound []  - 0 Complex Wound Measurement - multiple wounds INTERVENTIONS - Wound Dressings []  - Small Wound Dressing one or multiple wounds 0 X- 1 15 Medium Wound Dressing one or multiple wounds []  - 0 Large Wound Dressing one or multiple wounds []  - 0 Application of Medications - topical []  - 0 Application of Medications - injection INTERVENTIONS - Miscellaneous []  - External ear exam 0 []  - 0 Specimen Collection (cultures, biopsies, blood, body fluids, etc.) []  - 0 Specimen(s) / Culture(s) sent or taken to Lab for analysis []  - 0 Patient Transfer (multiple staff / [employer] fast streamer / Similar devices) []  - 0 Simple Staple / Suture removal (25 or less) []  - 0 Complex Staple / Suture removal (26 or more) []  - 0 Hypo / Hyperglycemic Management (close monitor of Blood Glucose) []  - 0 Ankle / Brachial Index (ABI) - do not check if billed separately X- 1 5 Vital Signs Has the patient been seen at the hospital within the last three years: Yes Total Score: 80 Level Of Care: New/Established - Level 3 Electronic Signature(s) Signed: 06/08/2019 9:50:05 AM By: [name] Entered By: [name] on 06/08/2019 08:39:24 [name] ([id number]) -------------------------------------------------------------------------------- Encounter Discharge Information Details Patient Name: [name]. Date of Service: 06/08/2019 8:15 AM Medical Record Number: [id number] Patient Account Number: [id number] Date of Birth/Sex: 1950/02/15 (68 y.o. F) Treating RN: [name] Primary Care [name]: [name] Other Clinician: Referring [name]: [name] Treating [name]/Extender: [name] in Treatment: 3 Encounter Discharge Information Items Discharge Condition: Stable Ambulatory Status: Ambulatory Discharge Destination: Home Transportation: Private Auto Accompanied By: self Schedule Follow-up Appointment: Yes Clinical Summary of Care: Electronic Signature(s) Signed: 06/08/2019 9:50:05 AM By: [name] Entered By: [name] on 06/08/2019 08:39:59 Carby, Omaria M. ([id number]) -------------------------------------------------------------------------------- Lower Extremity Assessment Details Patient Name: [name]. Date of Service: 06/08/2019 8:15 AM Medical Record Number: [id number] Patient Account Number: [id number] Date of Birth/Sex: [birth year] (68 y.o. F) Treating RN: Montey Hora Primary Care Nazire Fruth: [name] Other Clinician: Referring Harmon Ducre: [name] Treating Chianne Byrns/Extender: Ricard Dillon Weeks in Treatment: 3 Edema Assessment Assessed: [Left: No] [Right: No] Edema: [Left: N] [Right: o] Vascular Assessment Pulses: Dorsalis Pedis Palpable: [Right:Yes] Electronic Signature(s) Signed: 06/08/2019 5:37:23 PM By: Montey Hora Entered By: Montey Hora on 06/08/2019 08:29:06 Lourenco, Duaa M. ([id number]) -------------------------------------------------------------------------------- Multi Wound Chart Details Patient Name: [name]. Date of Service: 06/08/2019 8:15 AM Medical Record Number: [id number] Patient Account Number: [id number] Date of Birth/Sex: May 02, 1950 (68 y.o. F) Treating RN: [name] Primary Care Jahlil Ziller: [name] Other Clinician: Referring Leroi Haque: [name] Treating Ruie Sendejo/Extender: [name] in Treatment: 3 Vital Signs Height(in): 66 Pulse(bpm): 77 Weight(lbs): 130 Blood Pressure(mmHg): 144/70 Body Mass Index(BMI): 21 Temperature(F): 98.3 Respiratory Rate(breaths/min): 16 Photos:  [N/A:N/A] Wound Location: Right, Lateral Lower Leg N/A N/A Wounding Event: Trauma N/A N/A Primary Etiology: Trauma, Other N/A N/A Comorbid History: Hypertension N/A N/A Date Acquired: 05/10/2019 N/A N/A Weeks of Treatment: 3 N/A N/A Wound Status: Open N/A N/A Measurements L x W x D (cm) 3x2.6x0.2 N/A N/A Area (cm) : 6.126 N/A N/A Volume (cm) : 1.225 N/A N/A % Reduction in Area: 23.50% N/A N/A % Reduction in Volume: 49.00% N/A N/A Classification: Full Thickness Without Exposed N/A N/A Support Structures Exudate Amount: Medium N/A N/A Exudate Type: Serous  N/A N/A Exudate Color: amber N/A N/A Wound Margin: Flat and Intact N/A N/A Granulation Amount: Large (67-100%) N/A N/A Granulation Quality: Pink N/A N/A Necrotic Amount: Small (1-33%) N/A N/A Exposed Structures: Fat Layer (Subcutaneous Tissue) N/A N/A Exposed: Yes Fascia: No Tendon: No Muscle: No Joint: No Bone: No Epithelialization: None N/A N/A Treatment Notes Wound #1 (Right, Lateral Lower Leg) Notes Hblue ABD, conform, tubi F Electronic Signature(s) CAYLEN, KUWAHARA ([id number]) Signed: 06/08/2019 5:46:06 PM By: Baltazar Najjar MD Entered By: Baltazar Najjar on 06/08/2019 08:40:20 [name] ([id number]) -------------------------------------------------------------------------------- Multi-Disciplinary Care Plan Details Patient Name: [name], [name]. Date of Service: 06/08/2019 8:15 AM Medical Record Number: [id number] Patient Account Number: [id number] Date of Birth/Sex: March 24, 1950 (68 y.o. F) Treating RN: [name] Primary Care Jamisha Hoeschen: [name] Other Clinician: Referring Kinsley Nicklaus: [name] Treating Jatavia Keltner/Extender: Laura [place] in Treatment: 3 Active Inactive Orientation to the Wound Care Program Nursing Diagnoses: Knowledge deficit related to the wound healing center program Goals: Patient/caregiver will verbalize understanding of the Wound Healing Center Program Date  Initiated: 05/18/2019 Target Resolution Date: 05/26/2019 Goal Status: Active Interventions: Provide education on orientation to the wound center Notes: Wound/Skin Impairment Nursing Diagnoses: Impaired tissue integrity Goals: Ulcer/skin breakdown will have a volume reduction of 30% by week 4 Date Initiated: 05/18/2019 Target Resolution Date: 06/15/2019 Goal Status: Active Interventions: Assess ulceration(s) every visit Notes: Electronic Signature(s) Signed: 06/08/2019 9:50:05 AM By: [name] Entered By: [name] on 06/08/2019 08:38:38 Melgarejo, Havannah M. ([id number]) -------------------------------------------------------------------------------- Pain Assessment Details Patient Name: [name]. Date of Service: 06/08/2019 8:15 AM Medical Record Number: [id number] Patient Account Number: [id number] Date of Birth/Sex: 1950/01/31 (68 y.o. F) Treating RN: [name] Primary Care Hollyanne Schloesser: [name] Other Clinician: Referring Shahiem Bedwell: [name] Treating Nallely Yost/Extender: Laura [place] in Treatment: 3 Active Problems Location of Pain Severity and Description of Pain Patient Has Paino No Site Locations Pain Management and Medication Current Pain Management: Electronic Signature(s) Signed: 06/08/2019 5:37:23 PM By: [name] Entered By: [name] on 06/08/2019 08:24:25 [name] ([id number]) -------------------------------------------------------------------------------- Patient/Caregiver Education Details Patient Name: [name]. Date of Service: 06/08/2019 8:15 AM Medical Record Number: [id number] Patient Account Number: [id number] Date of Birth/Gender: September 04, 1950 (69 y.o. F) Treating RN: [name] Primary Care Physician: [name] Other Clinician: Referring Physician: Joycelyn Moss Treating Physician/Extender: Laura [place] in Treatment: 3 Education Assessment Education Provided  To: Patient Education Topics Provided Wound/Skin Impairment: Handouts: Caring for Your Ulcer Methods: Demonstration, Explain/Verbal Responses: State content correctly Electronic Signature(s) Signed: 06/08/2019 9:50:05 AM By: [name] Entered By: [name] on 06/08/2019 08:39:36 Pala, Akeyla M. ([id number]) -------------------------------------------------------------------------------- Wound Assessment Details Patient Name: [name]. Date of Service: 06/08/2019 8:15 AM Medical Record Number: [id number] Patient Account Number: [id number] Date of Birth/Sex: Jun 08, 1950 (68 y.o. F) Treating RN: [name] Primary Care Sophiagrace Benbrook: [name] Other Clinician: Referring Hilmar Moldovan: [name] Treating Jadalyn Oliveri/Extender: Laura [place] in Treatment: 3 Wound Status Wound Number: 1 Primary Etiology: Trauma, Other Wound Location: Right, Lateral Lower Leg Wound Status: Open Wounding Event: Trauma Comorbid History: Hypertension Date Acquired: 05/10/2019 Weeks Of Treatment: 3 Clustered Wound: No Photos Wound Measurements Length: (cm) 3 Width: (cm) 2.6 Depth: (cm) 0.2 Area: (cm) 6.126 Volume: (cm) 1.225 % Reduction in Area: 23.5% % Reduction in Volume: 49% Epithelialization: None Tunneling: No Undermining: No Wound Description Classification: Full Thickness Without Exposed Support Structu Wound Margin: Flat and Intact Exudate Amount: Medium Exudate Type: Serous Exudate Color: amber res Foul Odor After  Cleansing: No Slough/Fibrino Yes Wound Bed Granulation Amount: Large (67-100%) Exposed Structure Granulation Quality: Pink Fascia Exposed: No Necrotic Amount: Small (1-33%) Fat Layer (Subcutaneous Tissue) Exposed: Yes Necrotic Quality: Adherent Slough Tendon Exposed: No Muscle Exposed: No Joint Exposed: No Bone Exposed: No Treatment Notes Wound #1 (Right, Lateral Lower Leg) Notes Hblue ABD, conform, tubi F Electronic  Signature(s) Signed: 06/08/2019 5:37:23 PM By: Garlan Fillers ([id number]) Entered By: [name] on 06/08/2019 08:28:30 [name] ([id number]) -------------------------------------------------------------------------------- Vitals Details Patient Name: [name]. Date of Service: 06/08/2019 8:15 AM Medical Record Number: [id number] Patient Account Number: [id number] Date of Birth/Sex: July 31, 1950 (68 y.o. F) Treating RN: [name] Primary Care Calbert Hulsebus: [name] Other Clinician: Referring Karene Bracken: [name] Treating Aiman Sonn/Extender: Laura [place] in Treatment: 3 Vital Signs Time Taken: 08:21 Temperature (F): 98.3 Height (in): 66 Pulse (bpm): 89 Weight (lbs): 130 Respiratory Rate (breaths/min): 16 Body Mass Index (BMI): 21 Blood Pressure (mmHg): 144/70 Reference Range: 80 - 120 mg / dl Electronic Signature(s) Signed: 06/08/2019 5:37:23 PM By: [name] Entered By: [name] on 06/08/2019 08:23:47

## 2019-06-09 NOTE — Progress Notes (Signed)
[name] ([id number]) Visit Report for 06/08/2019 HPI Details Patient Name: [name], [name]. Date of Service: 06/08/2019 8:15 AM Medical Record Number: [id number] Patient Account Number: [id number] Date of Birth/Sex: 1950-04-22 (68 y.o. F) Treating RN: Rodell Perna Primary Care Provider: Joycelyn Man Other Clinician: Referring Provider: Joycelyn Man Treating Provider/Extender: Altamese [place] in Treatment: 3 History of Present Illness HPI Description: 05/19/2019 upon evaluation today patient presents for initial inspection here in our clinic concerning an injury that she has to the right lateral lower extremity. She does have associated cellulitis as well. Unfortunately she is continuing to have some discomfort at this point. She has been seen and prescribed doxycycline which she is currently taking that is excellent news. With that being said she fortunately does not appear to have any significant edema of the lower extremities at this point other than right around the wound area I do not think she has venous stasis which should be good for her as far as healing is concerned she is less likely to have complications. We still may want to use something such as Tubigrip in order to help with just some of the local edema at this time. The patient is in agreement with that plan. I am also can have to likely perform some debridement to clear away some of the necrotic tissue currently as far as the wound surface is concerned. The patient has a history of hypertension but otherwise has no major medical problems. 05/25/2019 upon evaluation today patient appears to be doing roughly the same with regard to her wound. Unfortunately the skin flap that I was hoping would survive does not appear to have. This is going to require debridement in order to clear this away so that the wound can heal appropriately. That was discussed with the patient today and she understands and was completely  expecting that based on how it looked as well. I would kind of forewarned her last week. 5/13; patient with a traumatic wound on the right lateral calf area. [Traumatized on [employer] door]. She has been using silver alginate. This was apparently complicated by cellulitis initially. Not currently on antibiotics. 5/20; traumatic wound to the right lateral calf. I changed her to [facility] after debridement last week. Wound is measuring about half a centimeter smaller in length and width. Surface looks healthy under illumination. She is changing the dressing every second Electronic Signature(s) Signed: 06/08/2019 5:46:06 PM By: Baltazar Najjar MD Entered By: Baltazar Najjar on 06/08/2019 08:41:19 [name] ([id number]) -------------------------------------------------------------------------------- Physical Exam Details Patient Name: [name]. Date of Service: 06/08/2019 8:15 AM Medical Record Number: [id number] Patient Account Number: [id number] Date of Birth/Sex: May 10, 1950 (68 y.o. F) Treating RN: Rodell Perna Primary Care Provider: Joycelyn Man Other Clinician: Referring Provider: Joycelyn Man Treating Provider/Extender: Altamese [place] in Treatment: 3 Cardiovascular Pedal pulses are palpable on the right. Integumentary (Hair, Skin) No erythema surrounding the wound. Notes Wound surface looks better. No debridement mechanically. Under illumination the surface looks healthy. Half a centimeter of improvement in length and width. Electronic Signature(s) Signed: 06/08/2019 5:46:06 PM By: Baltazar Najjar MD Entered By: Baltazar Najjar on 06/08/2019 08:42:05 [name] ([id number]) -------------------------------------------------------------------------------- Physician Orders Details Patient Name: [name]. Date of Service: 06/08/2019 8:15 AM Medical Record Number: [id number] Patient Account Number: [id number] Date of Birth/Sex: 1950/12/27 (68  y.o. F) Treating RN: Rodell Perna Primary Care Provider: Joycelyn Man Other Clinician: Referring Provider: Joycelyn Man Treating Provider/Extender: Altamese [place] in Treatment:  3 Verbal / Phone Orders: No Diagnosis Coding Wound Cleansing Wound #1 Right,Lateral Lower Leg o Clean wound with Normal Saline. - in office o Dial antibacterial soap, wash wounds, rinse and pat dry prior to dressing wounds Primary Wound Dressing Wound #1 Right,Lateral Lower Leg o Hydrafera Blue Ready Transfer Secondary Dressing o ABD and Kerlix/Conform Dressing Change Frequency Wound #1 Right,Lateral Lower Leg o Change dressing every other day. Follow-up Appointments Wound #1 Right,Lateral Lower Leg o Return Appointment in 1 week. Edema Control Wound #1 Right,Lateral Lower Leg o Elevate legs to the level of the heart and pump ankles as often as possible o Other: - tubi grip Electronic Signature(s) Signed: 06/08/2019 9:50:05 AM By: Army Melia Signed: 06/08/2019 5:46:06 PM By: Linton Ham MD Entered By: Army Melia on 06/08/2019 08:39:02 Lachman, Georgia Duff ([id number]) -------------------------------------------------------------------------------- Problem List Details Patient Name: [name]. Date of Service: 06/08/2019 8:15 AM Medical Record Number: [id number] Patient Account Number: [id number] Date of Birth/Sex: September 27, 1950 (68 y.o. F) Treating RN: Army Melia Primary Care Provider: Fenton Malling Other Clinician: Referring Provider: Fenton Malling Treating Provider/Extender: Tito Dine in Treatment: 3 Active Problems ICD-10 Encounter Code Description Active Date MDM Diagnosis S81.801A Unspecified open wound, right lower leg, initial encounter 05/18/2019 No Yes L97.812 Non-pressure chronic ulcer of other part of right lower leg with fat layer 05/18/2019 No Yes exposed L03.115 Cellulitis of right lower limb 05/18/2019 No Yes I10  Essential (primary) hypertension 05/18/2019 No Yes Inactive Problems Resolved Problems Electronic Signature(s) Signed: 06/08/2019 5:46:06 PM By: Linton Ham MD Entered By: Linton Ham on 06/08/2019 08:40:13 Lewing, Georgia Duff ([id number]) -------------------------------------------------------------------------------- Progress Note Details Patient Name: [name]. Date of Service: 06/08/2019 8:15 AM Medical Record Number: [id number] Patient Account Number: [id number] Date of Birth/Sex: 1950/03/30 (68 y.o. F) Treating RN: Army Melia Primary Care Provider: Fenton Malling Other Clinician: Referring Provider: Fenton Malling Treating Provider/Extender: Tito Dine in Treatment: 3 Subjective History of Present Illness (HPI) 05/19/2019 upon evaluation today patient presents for initial inspection here in our clinic concerning an injury that she has to the right lateral lower extremity. She does have associated cellulitis as well. Unfortunately she is continuing to have some discomfort at this point. She has been seen and prescribed doxycycline which she is currently taking that is excellent news. With that being said she fortunately does not appear to have any significant edema of the lower extremities at this point other than right around the wound area I do not think she has venous stasis which should be good for her as far as healing is concerned she is less likely to have complications. We still may want to use something such as Tubigrip in order to help with just some of the local edema at this time. The patient is in agreement with that plan. I am also can have to likely perform some debridement to clear away some of the necrotic tissue currently as far as the wound surface is concerned. The patient has a history of hypertension but otherwise has no major medical problems. 05/25/2019 upon evaluation today patient appears to be doing roughly the same with regard to her  wound. Unfortunately the skin flap that I was hoping would survive does not appear to have. This is going to require debridement in order to clear this away so that the wound can heal appropriately. That was discussed with the patient today and she understands and was completely expecting that based on how it looked as well. I would kind  of forewarned her last week. 5/13; patient with a traumatic wound on the right lateral calf area. [Traumatized on [employer] door]. She has been using silver alginate. This was apparently complicated by cellulitis initially. Not currently on antibiotics. 5/20; traumatic wound to the right lateral calf. I changed her to [facility] after debridement last week. Wound is measuring about half a centimeter smaller in length and width. Surface looks healthy under illumination. She is changing the dressing every second Objective Constitutional Vitals Time Taken: 8:21 AM, Height: 66 in, Weight: 130 lbs, BMI: 21, Temperature: 98.3 F, Pulse: 89 bpm, Respiratory Rate: 16 breaths/min, Blood Pressure: 144/70 mmHg. Cardiovascular Pedal pulses are palpable on the right. General Notes: Wound surface looks better. No debridement mechanically. Under illumination the surface looks healthy. Half a centimeter of improvement in length and width. Integumentary (Hair, Skin) No erythema surrounding the wound. Wound #1 status is Open. Original cause of wound was Trauma. The wound is located on the Right,Lateral Lower Leg. The wound measures 3cm length x 2.6cm width x 0.2cm depth; 6.126cm^2 area and 1.225cm^3 volume. There is Fat Layer (Subcutaneous Tissue) Exposed exposed. There is no tunneling or undermining noted. There is a medium amount of serous drainage noted. The wound margin is flat and intact. There is large (67-100%) pink granulation within the wound bed. There is a small (1-33%) amount of necrotic tissue within the wound bed including Adherent  Slough. Assessment Active Problems ICD-10 Unspecified open wound, right lower leg, initial encounter Non-pressure chronic ulcer of other part of right lower leg with fat layer exposed Cellulitis of right lower limb Essential (primary) hypertension Cuny, Dustin M. ([id number]) Plan Wound Cleansing: Wound #1 Right,Lateral Lower Leg: Clean wound with Normal Saline. - in office Dial antibacterial soap, wash wounds, rinse and pat dry prior to dressing wounds Primary Wound Dressing: Wound #1 Right,Lateral Lower Leg: Hydrafera Blue Ready Transfer Secondary Dressing: ABD and Kerlix/Conform Dressing Change Frequency: Wound #1 Right,Lateral Lower Leg: Change dressing every other day. Follow-up Appointments: Wound #1 Right,Lateral Lower Leg: Return Appointment in 1 week. Edema Control: Wound #1 Right,Lateral Lower Leg: Elevate legs to the level of the heart and pump ankles as often as possible Other: - tubi grip 1. I am going to continue with the [facility] ABD 2. She is using Tubigrip at home and changing the whole thing every second day. She does not have a lot of edema I have not felt strongly about a compression wrap 3. No evidence of surrounding infection or skin Electronic Signature(s) Signed: 06/08/2019 5:46:06 PM By: Baltazar Najjar MD Entered By: Baltazar Najjar on 06/08/2019 08:42:53 Brege, Orlin Hilding ([id number]) -------------------------------------------------------------------------------- SuperBill Details Patient Name: [name] Date of Service: 06/08/2019 Medical Record Number: [id number] Patient Account Number: [id number] Date of Birth/Sex: [birth year] (69 y.o. F) Treating RN: Rodell Perna Primary Care Provider: Joycelyn Man Other Clinician: Referring Provider: Joycelyn Man Treating Provider/Extender: Altamese [place] in Treatment: 3 Diagnosis Coding ICD-10 Codes Code Description [phone] Unspecified open wound, right lower leg, initial  encounter L97.812 Non-pressure chronic ulcer of other part of right lower leg with fat layer exposed L03.115 Cellulitis of right lower limb I10 Essential (primary) hypertension Facility Procedures CPT4 Code: [id number] Description: 99213 - WOUND CARE VISIT-LEV 3 EST PT Modifier: Quantity: 1 Physician Procedures CPT4 Code: [id number] Description: 99213 - WC PHYS LEVEL 3 - EST PT Modifier: Quantity: 1 CPT4 Code: Description: ICD-10 Diagnosis Description S81.801A Unspecified open wound, right lower leg, initial encounter L97.812 Non-pressure chronic ulcer of other part  of right lower leg with fat la Modifier: yer exposed Quantity: Electronic Signature(s) Signed: 06/08/2019 5:46:06 PM By: Baltazar Najjar MD Entered By: Baltazar Najjar on 06/08/2019 08:43:17

## 2019-06-16 ENCOUNTER — Encounter: Payer: Medicare Other | Admitting: Physician Assistant

## 2019-06-16 ENCOUNTER — Other Ambulatory Visit: Payer: Self-pay

## 2019-06-16 DIAGNOSIS — L97812 Non-pressure chronic ulcer of other part of right lower leg with fat layer exposed: Secondary | ICD-10-CM | POA: Diagnosis not present

## 2019-06-16 NOTE — Progress Notes (Signed)
[name] ([id number]) Visit Report for 06/16/2019 Arrival Information Details Patient Name: [name], [name]. Date of Service: 06/16/2019 8:00 AM Medical Record Number: [id number] Patient Account Number: [id number] Date of Birth/Sex: [birth year] (69 y.o. F) Treating RN: Rodell Perna Primary Care Nocholas Damaso: Joycelyn Man Other Clinician: Referring Brianda Beitler: Joycelyn Man Treating Kelsey Edman/Extender: Linwood Dibbles, HOYT Weeks in Treatment: 4 Visit Information History Since Last Visit Added or deleted any medications: No Patient Arrived: Ambulatory Any new allergies or adverse reactions: No Arrival Time: 08:09 Had a fall or experienced change in No Accompanied By: self activities of daily living that may affect Transfer Assistance: None risk of falls: Patient Identification Verified: Yes Signs or symptoms of abuse/neglect since last visito No Hospitalized since last visit: No Has Dressing in Place as Prescribed: Yes Pain Present Now: No Electronic Signature(s) Signed: 06/16/2019 1:04:11 PM By: Rodell Perna Entered By: Rodell Perna on 06/16/2019 08:09:55 [name] ([id number]) -------------------------------------------------------------------------------- Clinic Level of Care Assessment Details Patient Name: [name]. Date of Service: 06/16/2019 8:00 AM Medical Record Number: [id number] Patient Account Number: [id number] Date of Birth/Sex: 05-22-1950 (69 y.o. F) Treating RN: Huel Coventry Primary Care Aquarius Latouche: Joycelyn Man Other Clinician: Referring Sharod Petsch: Joycelyn Man Treating Athony Coppa/Extender: Linwood Dibbles, HOYT Weeks in Treatment: 4 Clinic Level of Care Assessment Items TOOL 4 Quantity Score []  - Use when only an EandM is performed on FOLLOW-UP visit 0 ASSESSMENTS - Nursing Assessment / Reassessment X - Reassessment of Co-morbidities (includes updates in patient status) 1 10 X- 1 5 Reassessment of Adherence to Treatment Plan ASSESSMENTS - Wound and  Skin Assessment / Reassessment X - Simple Wound Assessment / Reassessment - one wound 1 5 []  - 0 Complex Wound Assessment / Reassessment - multiple wounds []  - 0 Dermatologic / Skin Assessment (not related to wound area) ASSESSMENTS - Focused Assessment []  - Circumferential Edema Measurements - multi extremities 0 []  - 0 Nutritional Assessment / Counseling / Intervention []  - 0 Lower Extremity Assessment (monofilament, tuning fork, pulses) []  - 0 Peripheral Arterial Disease Assessment (using hand held doppler) ASSESSMENTS - Ostomy and/or Continence Assessment and Care []  - Incontinence Assessment and Management 0 []  - 0 Ostomy Care Assessment and Management (repouching, etc.) PROCESS - Coordination of Care X - Simple Patient / Family Education for ongoing care 1 15 []  - 0 Complex (extensive) Patient / Family Education for ongoing care []  - 0 Staff obtains , Records, Test Results / Process Orders []  - 0 Staff telephones HHA, Nursing Homes / Clarify orders / etc []  - 0 Routine Transfer to another Facility (non-emergent condition) []  - 0 Routine Hospital Admission (non-emergent condition) []  - 0 New Admissions / / Ordering NPWT, Apligraf, etc. []  - 0 Emergency Hospital Admission (emergent condition) X- 1 10 Simple Discharge Coordination []  - 0 Complex (extensive) Discharge Coordination PROCESS - Special Needs []  - Pediatric / Minor Patient Management 0 []  - 0 Isolation Patient Management []  - 0 Hearing / Language / Visual special needs []  - 0 Assessment of Community assistance (transportation, D/C planning, etc.) []  - 0 Additional assistance / Altered mentation []  - 0 Support Surface(s) Assessment (bed, cushion, seat, etc.) INTERVENTIONS - Wound Cleansing / Measurement Moss, Laura M. ( ) X- 1 5 Simple Wound Cleansing - one wound []  - 0 Complex Wound Cleansing - multiple wounds X- 1 5 Wound Imaging (photographs - any number  of wounds) []  - 0 Wound Tracing (instead of photographs) X- 1 5 Simple Wound Measurement - one wound []  -  0 Complex Wound Measurement - multiple wounds INTERVENTIONS - Wound Dressings []  - Small Wound Dressing one or multiple wounds 0 X- 1 15 Medium Wound Dressing one or multiple wounds []  - 0 Large Wound Dressing one or multiple wounds []  - 0 Application of Medications - topical []  - 0 Application of Medications - injection INTERVENTIONS - Miscellaneous []  - External ear exam 0 []  - 0 Specimen Collection (cultures, biopsies, blood, body fluids, etc.) []  - 0 Specimen(s) / Culture(s) sent or taken to Lab for analysis []  - 0 Patient Transfer (multiple staff / / Similar devices) []  - 0 Simple Staple / Suture removal (25 or less) []  - 0 Complex Staple / Suture removal (26 or more) []  - 0 Hypo / Hyperglycemic Management (close monitor of Blood Glucose) []  - 0 Ankle / Brachial Index (ABI) - do not check if billed separately X- 1 5 Vital Signs Has the patient been seen at the hospital within the last three years: Yes Total Score: 80 Level Of Care: New/Established - Level 3 Electronic Signature(s) Signed: 06/16/2019 2:01:12 PM By: , BSN, RN, CWS, Kim RN, BSN Entered By: , BSN, RN, CWS, Kim on 06/16/2019 08:31:22 ( ) -------------------------------------------------------------------------------- Encounter Discharge Information Details Patient Name: [name], [name]. Date of Service: 06/16/2019 8:00 AM Medical Record Number: Patient Account Number: Date of Birth/Sex: 1950/08/03 (69 y.o. F) Treating RN: 06/18/2019 Primary Care Mirtie Bastyr: Elliot Gurney Other Clinician: Referring Bradie Sangiovanni: Elliot Gurney Treating Dalores Weger/Extender: 06/18/2019, HOYT Weeks in Treatment: 4 Encounter Discharge Information Items Discharge Condition: Stable Ambulatory Status: Ambulatory Discharge Destination:  Home Transportation: Private Auto Accompanied By: self Schedule Follow-up Appointment: Yes Clinical Summary of Care: Electronic Signature(s) Signed: 06/16/2019 2:01:12 PM By: [id number], BSN, RN, CWS, Kim RN, BSN Entered By: [name], BSN, RN, CWS, Kim on 06/16/2019 08:36:21 [id number] ([id number]) -------------------------------------------------------------------------------- Lower Extremity Assessment Details Patient Name: [name], [name]. Date of Service: 06/16/2019 8:00 AM Medical Record Number: Huel Coventry Patient Account Number: Joycelyn Man Date of Birth/Sex: [birth year] (69 y.o. F) Treating RN: 06/18/2019 Primary Care Lexus Barletta: Elliot Gurney Other Clinician: Referring Achille Xiang: Elliot Gurney Treating Jamye Balicki/Extender: 06/18/2019, HOYT Weeks in Treatment: 4 Edema Assessment Assessed: [Left: No] [Right: No] Edema: [Left: N] [Right: o] Vascular Assessment Pulses: Dorsalis Pedis Palpable: [Right:Yes] Electronic Signature(s) Signed: 06/16/2019 1:04:11 PM By: [id number] Entered By: [name] on 06/16/2019 08:13:53 [name], Laura M. ([id number]) -------------------------------------------------------------------------------- Multi Wound Chart Details Patient Name: [id number]. Date of Service: 06/16/2019 8:00 AM Medical Record Number: [id number] Patient Account Number: Rodell Perna Date of Birth/Sex: [birth year] (69 y.o. F) Treating RN: Linwood Dibbles Primary Care Guandique Paino: 06/18/2019 Other Clinician: Referring Timarie Labell: Rodell Perna Treating Minnah Llamas/Extender: Rodell Perna, HOYT Weeks in Treatment: 4 Vital Signs Height(in): 66 Pulse(bpm): 74 Weight(lbs): 130 Blood Pressure(mmHg): 131/72 Body Mass Index(BMI): 21 Temperature(F): 98.0 Respiratory Rate(breaths/min): 16 Photos: [N/A:N/A] Wound Location: Right, Lateral Lower Leg N/A N/A Wounding Event: Trauma N/A N/A Primary Etiology: Trauma, Other N/A N/A Comorbid History: Hypertension N/A N/A Date Acquired:  05/10/2019 N/A N/A Weeks of Treatment: 4 N/A N/A Wound Status: Open N/A N/A Measurements L x W x D (cm) 2.7x2.1x0.1 N/A N/A Area (cm) : 4.453 N/A N/A Volume (cm) : 0.445 N/A N/A % Reduction in Area: 44.40% N/A N/A % Reduction in Volume: 81.50% N/A N/A Classification: Full Thickness Without Exposed N/A N/A Support Structures Exudate Amount: Medium N/A N/A Exudate Type: Serous N/A N/A Exudate Color: amber N/A N/A Wound Margin: Flat and Intact N/A N/A Granulation  Amount: Large (67-100%) N/A N/A Granulation Quality: Pink N/A N/A Necrotic Amount: Small (1-33%) N/A N/A Exposed Structures: Fat Layer (Subcutaneous Tissue) N/A N/A Exposed: Yes Fascia: No Tendon: No Muscle: No Joint: No Bone: No Epithelialization: None N/A N/A Treatment Notes Electronic Signature(s) Signed: 06/16/2019 2:01:12 PM By: Elliot Gurney, BSN, RN, CWS, Kim RN, BSN Entered By: Elliot Gurney, BSN, RN, CWS, Kim on 06/16/2019 08:30:21 [name] ([id number]) -------------------------------------------------------------------------------- Multi-Disciplinary Care Plan Details Patient Name: [name], [name]. Date of Service: 06/16/2019 8:00 AM Medical Record Number: [id number] Patient Account Number: [id number] Date of Birth/Sex: 05/31/1950 (69 y.o. F) Treating RN: Huel Coventry Primary Care Daejon Lich: Joycelyn Man Other Clinician: Referring Zubair Lofton: Joycelyn Man Treating Ermias Tomeo/Extender: Linwood Dibbles, HOYT Weeks in Treatment: 4 Active Inactive Orientation to the Wound Care Program Nursing Diagnoses: Knowledge deficit related to the wound healing center program Goals: Patient/caregiver will verbalize understanding of the Wound Healing Center Program Date Initiated: 05/18/2019 Target Resolution Date: 05/26/2019 Goal Status: Active Interventions: Provide education on orientation to the wound center Notes: Wound/Skin Impairment Nursing Diagnoses: Impaired tissue integrity Goals: Ulcer/skin breakdown will have a volume  reduction of 30% by week 4 Date Initiated: 05/18/2019 Target Resolution Date: 06/15/2019 Goal Status: Active Interventions: Assess ulceration(s) every visit Notes: Electronic Signature(s) Signed: 06/16/2019 2:01:12 PM By: Elliot Gurney, BSN, RN, CWS, Kim RN, BSN Entered By: Elliot Gurney, BSN, RN, CWS, Kim on 06/16/2019 08:30:13 [name] ([id number]) -------------------------------------------------------------------------------- Pain Assessment Details Patient Name: [name]. Date of Service: 06/16/2019 8:00 AM Medical Record Number: [id number] Patient Account Number: [id number] Date of Birth/Sex: 1950/07/03 (69 y.o. F) Treating RN: Rodell Perna Primary Care Demaria Deeney: Joycelyn Man Other Clinician: Referring Johnnie Moten: Joycelyn Man Treating Regis Wiland/Extender: Linwood Dibbles, HOYT Weeks in Treatment: 4 Active Problems Location of Pain Severity and Description of Pain Patient Has Paino No Site Locations Pain Management and Medication Current Pain Management: Electronic Signature(s) Signed: 06/16/2019 1:04:11 PM By: Rodell Perna Entered By: Rodell Perna on 06/16/2019 08:10:10 [name] ([id number]) -------------------------------------------------------------------------------- Patient/Caregiver Education Details Patient Name: [name]. Date of Service: 06/16/2019 8:00 AM Medical Record Number: [id number] Patient Account Number: [id number] Date of Birth/Gender: [birth year] (69 y.o. F) Treating RN: Huel Coventry Primary Care Physician: Joycelyn Man Other Clinician: Referring Physician: Joycelyn Man Treating Physician/Extender: Skeet Simmer in Treatment: 4 Education Assessment Education Provided To: Patient Education Topics Provided Venous: Wound/Skin Impairment: Handouts: Caring for Your Ulcer, Other: continue wound care as prescribed Methods: Demonstration, Explain/Verbal Responses: State content correctly Electronic Signature(s) Signed: 06/16/2019  2:01:12 PM By: Elliot Gurney, BSN, RN, CWS, Kim RN, BSN Entered By: Elliot Gurney, BSN, RN, CWS, Kim on 06/16/2019 08:31:52 [name] ([id number]) -------------------------------------------------------------------------------- Wound Assessment Details Patient Name: [name], [name]. Date of Service: 06/16/2019 8:00 AM Medical Record Number: [id number] Patient Account Number: [id number] Date of Birth/Sex: November 26, 1950 (69 y.o. F) Treating RN: Rodell Perna Primary Care Kobie Matkins: Joycelyn Man Other Clinician: Referring Elesha Thedford: Joycelyn Man Treating Camerin Jimenez/Extender: Linwood Dibbles, HOYT Weeks in Treatment: 4 Wound Status Wound Number: 1 Primary Etiology: Trauma, Other Wound Location: Right, Lateral Lower Leg Wound Status: Open Wounding Event: Trauma Comorbid History: Hypertension Date Acquired: 05/10/2019 Weeks Of Treatment: 4 Clustered Wound: No Photos Wound Measurements Length: (cm) 2.7 Width: (cm) 2.1 Depth: (cm) 0.1 Area: (cm) 4.453 Volume: (cm) 0.445 % Reduction in Area: 44.4% % Reduction in Volume: 81.5% Epithelialization: None Wound Description Classification: Full Thickness Without Exposed Support Structu Wound Margin: Flat and Intact Exudate Amount: Medium Exudate Type: Serous Exudate Color: amber res Foul Odor After Cleansing: No Slough/Fibrino Yes Wound  Bed Granulation Amount: Large (67-100%) Exposed Structure Granulation Quality: Pink Fascia Exposed: No Necrotic Amount: Small (1-33%) Fat Layer (Subcutaneous Tissue) Exposed: Yes Necrotic Quality: Adherent Slough Tendon Exposed: No Muscle Exposed: No Joint Exposed: No Bone Exposed: No Treatment Notes Wound #1 (Right, Lateral Lower Leg) Notes Hblue ABD, conform, tubi F Electronic Signature(s) Signed: 06/16/2019 1:04:11 PM By: Wallene Dales ([id number]) Entered By: Army Melia on 06/16/2019 08:12:41 Venditto, Laura Hugh M.  ([id number]) -------------------------------------------------------------------------------- Vitals Details Patient Name: [name]. Date of Service: 06/16/2019 8:00 AM Medical Record Number: [id number] Patient Account Number: [id number] Date of Birth/Sex: 1950/06/23 (69 y.o. F) Treating RN: Army Melia Primary Care Jamacia Jester: Fenton Malling Other Clinician: Referring Quadarius Henton: Fenton Malling Treating Kierstin January/Extender: Melburn Hake, HOYT Weeks in Treatment: 4 Vital Signs Time Taken: 08:09 Temperature (F): 98.0 Height (in): 66 Pulse (bpm): 74 Weight (lbs): 130 Respiratory Rate (breaths/min): 16 Body Mass Index (BMI): 21 Blood Pressure (mmHg): 131/72 Reference Range: 80 - 120 mg / dl Electronic Signature(s) Signed: 06/16/2019 1:04:11 PM By: Army Melia Entered By: Army Melia on 06/16/2019 08:10:06

## 2019-06-16 NOTE — Progress Notes (Signed)
Laura, Moss ([id number]) Visit Report for 06/16/2019 Chief Complaint Document Details Patient Name: [name], [name]. Date of Service: 06/16/2019 8:00 AM Medical Record Number: [id number] Patient Account Number: [id number] Date of Birth/Sex: [birth year] (68 y.o. F) Treating RN: Primary Care Provider: Fenton Malling Other Clinician: Referring Provider: Fenton Malling Treating Provider/Extender: Melburn Hake, Emmarae Cowdery Weeks in Treatment: 4 Information Obtained from: Patient Chief Complaint Right LE Ulcer with Cellulitis Electronic Signature(s) Signed: 06/16/2019 2:31:22 PM By: Worthy Keeler PA-C Entered By: Worthy Keeler on 06/16/2019 08:24:40 Girdler, Georgia Duff ([id number]) -------------------------------------------------------------------------------- HPI Details Patient Name: [name]. Date of Service: 06/16/2019 8:00 AM Medical Record Number: [id number] Patient Account Number: [id number] Date of Birth/Sex: 06-04-1950 (68 y.o. F) Treating RN: Primary Care Provider: Fenton Malling Other Clinician: Referring Provider: Fenton Malling Treating Provider/Extender: Melburn Hake, Jaydeen Odor Weeks in Treatment: 4 History of Present Illness HPI Description: 05/19/2019 upon evaluation today patient presents for initial inspection here in our clinic concerning an injury that she has to the right lateral lower extremity. She does have associated cellulitis as well. Unfortunately she is continuing to have some discomfort at this point. She has been seen and prescribed doxycycline which she is currently taking that is excellent news. With that being said she fortunately does not appear to have any significant edema of the lower extremities at this point other than right around the wound area I do not think she has venous stasis which should be good for her as far as healing is concerned she is less likely to have complications. We still may want to use something such as Tubigrip in order to help with  just some of the local edema at this time. The patient is in agreement with that plan. I am also can have to likely perform some debridement to clear away some of the necrotic tissue currently as far as the wound surface is concerned. The patient has a history of hypertension but otherwise has no major medical problems. 05/25/2019 upon evaluation today patient appears to be doing roughly the same with regard to her wound. Unfortunately the skin flap that I was hoping would survive does not appear to have. This is going to require debridement in order to clear this away so that the wound can heal appropriately. That was discussed with the patient today and she understands and was completely expecting that based on how it looked as well. I would kind of forewarned her last week. 5/13; patient with a traumatic wound on the right lateral calf area. [Traumatized on [employer] door]. She has been using silver alginate. This was apparently complicated by cellulitis initially. Not currently on antibiotics. 5/20; traumatic wound to the right lateral calf. I changed her to [facility] after debridement last week. Wound is measuring about half a centimeter smaller in length and width. Surface looks healthy under illumination. She is changing the dressing every second 06/16/2019 upon evaluation today patient actually appears to be doing quite well with regard to the wound on her leg. The Hydrofera Blue has done excellent for her and very pleased in this regard. Fortunately there is no signs of active infection at this moment which is also a good sign. Overall I think she is measuring well and headed in the right direction. Electronic Signature(s) Signed: 06/16/2019 8:32:43 AM By: Worthy Keeler PA-C Entered By: Worthy Keeler on 06/16/2019 08:32:42 JOELLA, SAEFONG ([id number]) -------------------------------------------------------------------------------- Physical Exam Details Patient Name: Laura, Moss. Date of Service: 06/16/2019 8:00 AM Medical  Record Number: [id number] Patient Account Number: [id number] Date of Birth/Sex: Sep 30, 1950 (68 y.o. F) Treating RN: Primary Care Provider: Joycelyn Man Other Clinician: Referring Provider: Joycelyn Man Treating Provider/Extender: Linwood Dibbles, Cresencio Reesor Weeks in Treatment: 4 Constitutional Well-nourished and well-hydrated in no acute distress. Respiratory normal breathing without difficulty. Psychiatric this patient is able to make decisions and demonstrates good insight into disease process. Alert and Oriented x 3. pleasant and cooperative. Notes Patient's wound bed currently showed signs of good granulation at this point. Fortunately there is no evidence of active infection which is great news and overall I think she is not only measuring better but the tissue quality is looking much better there is no evidence that this is infected at this point. Electronic Signature(s) Signed: 06/16/2019 8:33:02 AM By: Lenda Kelp PA-C Entered By: Lenda Kelp on 06/16/2019 08:33:02 [name] ([id number]) -------------------------------------------------------------------------------- Physician Orders Details Patient Name: Laura, Moss. Date of Service: 06/16/2019 8:00 AM Medical Record Number: [id number] Patient Account Number: [id number] Date of Birth/Sex: [birth year] (68 y.o. F) Treating RN: Huel Coventry Primary Care Provider: Joycelyn Man Other Clinician: Referring Provider: Joycelyn Man Treating Provider/Extender: Linwood Dibbles, Shaney Deckman Weeks in Treatment: 4 Verbal / Phone Orders: No Diagnosis Coding ICD-10 Coding Code Description S81.801A Unspecified open wound, right lower leg, initial encounter L97.812 Non-pressure chronic ulcer of other part of right lower leg with fat layer exposed L03.115 Cellulitis of right lower limb I10 Essential (primary) hypertension Wound Cleansing Wound #1 Right,Lateral Lower Leg o Clean wound  with Normal Saline. - in office o Dial antibacterial soap, wash wounds, rinse and pat dry prior to dressing wounds Primary Wound Dressing Wound #1 Right,Lateral Lower Leg o Hydrafera Blue Ready Transfer Secondary Dressing o ABD and Kerlix/Conform Dressing Change Frequency Wound #1 Right,Lateral Lower Leg o Change dressing every other day. Follow-up Appointments Wound #1 Right,Lateral Lower Leg o Return Appointment in 1 week. Edema Control Wound #1 Right,Lateral Lower Leg o Elevate legs to the level of the heart and pump ankles as often as possible o Other: - tubi grip Electronic Signature(s) Signed: 06/16/2019 2:01:12 PM By: Elliot Gurney, BSN, RN, CWS, Kim RN, BSN Signed: 06/16/2019 2:31:22 PM By: Lenda Kelp PA-C Entered By: Elliot Gurney, BSN, RN, CWS, Kim on 06/16/2019 08:30:54 [name] ([id number]) -------------------------------------------------------------------------------- Problem List Details Patient Name: KINBERLY, PERRIS. Date of Service: 06/16/2019 8:00 AM Medical Record Number: [id number] Patient Account Number: [id number] Date of Birth/Sex: 1950-02-07 (68 y.o. F) Treating RN: Primary Care Provider: Joycelyn Man Other Clinician: Referring Provider: Joycelyn Man Treating Provider/Extender: Linwood Dibbles, Ashritha Desrosiers Weeks in Treatment: 4 Active Problems ICD-10 Encounter Code Description Active Date MDM Diagnosis S81.801A Unspecified open wound, right lower leg, initial encounter 05/18/2019 No Yes L97.812 Non-pressure chronic ulcer of other part of right lower leg with fat layer 05/18/2019 No Yes exposed L03.115 Cellulitis of right lower limb 05/18/2019 No Yes I10 Essential (primary) hypertension 05/18/2019 No Yes Inactive Problems Resolved Problems Electronic Signature(s) Signed: 06/16/2019 2:31:22 PM By: Lenda Kelp PA-C Entered By: Lenda Kelp on 06/16/2019 08:24:29 [name]  ([id number]) -------------------------------------------------------------------------------- Progress Note Details Patient Name: [name] Date of Service: 06/16/2019 8:00 AM Medical Record Number: [id number] Patient Account Number: [id number] Date of Birth/Sex: 10/17/1950 (68 y.o. F) Treating RN: Primary Care Provider: Joycelyn Man Other Clinician: Referring Provider: Joycelyn Man Treating Provider/Extender: Linwood Dibbles, Imanni Burdine Weeks in Treatment: 4 Subjective Chief Complaint Information obtained from Patient Right LE Ulcer with Cellulitis History of Present Illness (HPI) 05/19/2019 upon evaluation today patient  presents for initial inspection here in our clinic concerning an injury that she has to the right lateral lower extremity. She does have associated cellulitis as well. Unfortunately she is continuing to have some discomfort at this point. She has been seen and prescribed doxycycline which she is currently taking that is excellent news. With that being said she fortunately does not appear to have any significant edema of the lower extremities at this point other than right around the wound area I do not think she has venous stasis which should be good for her as far as healing is concerned she is less likely to have complications. We still may want to use something such as Tubigrip in order to help with just some of the local edema at this time. The patient is in agreement with that plan. I am also can have to likely perform some debridement to clear away some of the necrotic tissue currently as far as the wound surface is concerned. The patient has a history of hypertension but otherwise has no major medical problems. 05/25/2019 upon evaluation today patient appears to be doing roughly the same with regard to her wound. Unfortunately the skin flap that I was hoping would survive does not appear to have. This is going to require debridement in order to clear this away so that  the wound can heal appropriately. That was discussed with the patient today and she understands and was completely expecting that based on how it looked as well. I would kind of forewarned her last week. 5/13; patient with a traumatic wound on the right lateral calf area. [Traumatized on [employer] door]. She has been using silver alginate. This was apparently complicated by cellulitis initially. Not currently on antibiotics. 5/20; traumatic wound to the right lateral calf. I changed her to [facility] after debridement last week. Wound is measuring about half a centimeter smaller in length and width. Surface looks healthy under illumination. She is changing the dressing every second 06/16/2019 upon evaluation today patient actually appears to be doing quite well with regard to the wound on her leg. The Hydrofera Blue has done excellent for her and very pleased in this regard. Fortunately there is no signs of active infection at this moment which is also a good sign. Overall I think she is measuring well and headed in the right direction. Objective Constitutional Well-nourished and well-hydrated in no acute distress. Vitals Time Taken: 8:09 AM, Height: 66 in, Weight: 130 lbs, BMI: 21, Temperature: 98.0 F, Pulse: 74 bpm, Respiratory Rate: 16 breaths/min, Blood Pressure: 131/72 mmHg. Respiratory normal breathing without difficulty. Psychiatric this patient is able to make decisions and demonstrates good insight into disease process. Alert and Oriented x 3. pleasant and cooperative. General Notes: Patient's wound bed currently showed signs of good granulation at this point. Fortunately there is no evidence of active infection which is great news and overall I think she is not only measuring better but the tissue quality is looking much better there is no evidence that this is infected at this point. Integumentary (Hair, Skin) Wound #1 status is Open. Original cause of wound was Trauma.  The wound is located on the Right,Lateral Lower Leg. The wound measures 2.7cm length x 2.1cm width x 0.1cm depth; 4.453cm^2 area and 0.445cm^3 volume. There is Fat Layer (Subcutaneous Tissue) Exposed exposed. There is a medium amount of serous drainage noted. The wound margin is flat and intact. There is large (67-100%) pink granulation within the wound bed. There is a  small (1-33%) amount of necrotic tissue within the wound bed including Adherent Slough. SHAKEERA, RIGHTMYER ([id number]) Assessment Active Problems ICD-10 Unspecified open wound, right lower leg, initial encounter Non-pressure chronic ulcer of other part of right lower leg with fat layer exposed Cellulitis of right lower limb Essential (primary) hypertension Plan Wound Cleansing: Wound #1 Right,Lateral Lower Leg: Clean wound with Normal Saline. - in office Dial antibacterial soap, wash wounds, rinse and pat dry prior to dressing wounds Primary Wound Dressing: Wound #1 Right,Lateral Lower Leg: Hydrafera Blue Ready Transfer Secondary Dressing: ABD and Kerlix/Conform Dressing Change Frequency: Wound #1 Right,Lateral Lower Leg: Change dressing every other day. Follow-up Appointments: Wound #1 Right,Lateral Lower Leg: Return Appointment in 1 week. Edema Control: Wound #1 Right,Lateral Lower Leg: Elevate legs to the level of the heart and pump ankles as often as possible Other: - tubi grip 1. My suggestion at this time is good to be that we go ahead and continue with the [facility] dressing that seems to be doing excellent for the patient she is in agreement with that plan. 2. I am also can recommend at this time that the patient continue with utilization of the Tubigrip to help with the mild edema at this time as well. 3. I would also recommend that she continue to elevate her legs when she is not up and active I do believe she can be as active as she needs to be but when she is sitting I would like for her to elevate. We  will see patient back for reevaluation in 1 week here in the clinic. If anything worsens or changes patient will contact our office for additional recommendations. Electronic Signature(s) Signed: 06/16/2019 8:50:31 AM By: Lenda Kelp PA-C Entered By: Lenda Kelp on 06/16/2019 08:50:31 Canepa, Orlin Hilding ([id number]) -------------------------------------------------------------------------------- SuperBill Details Patient Name: [name] Date of Service: 06/16/2019 Medical Record Number: [id number] Patient Account Number: [id number] Date of Birth/Sex: [birth year] (69 y.o. F) Treating RN: Primary Care Provider: Joycelyn Man Other Clinician: Referring Provider: Joycelyn Man Treating Provider/Extender: Linwood Dibbles, Gray Doering Weeks in Treatment: 4 Diagnosis Coding ICD-10 Codes Code Description S81.801A Unspecified open wound, right lower leg, initial encounter L97.812 Non-pressure chronic ulcer of other part of right lower leg with fat layer exposed L03.115 Cellulitis of right lower limb I10 Essential (primary) hypertension Facility Procedures CPT4 Code: 51884166 Description: 99213 - WOUND CARE VISIT-LEV 3 EST PT Modifier: Quantity: 1 Physician Procedures CPT4 Code: [id number] Description: 99213 - WC PHYS LEVEL 3 - EST PT Modifier: Quantity: 1 CPT4 Code: Description: ICD-10 Diagnosis Description S81.801A Unspecified open wound, right lower leg, initial encounter L97.812 Non-pressure chronic ulcer of other part of right lower leg with fat la L03.115 Cellulitis of right lower limb I10 Essential (primary)  hypertension Modifier: yer exposed Quantity: Electronic Signature(s) Signed: 06/16/2019 8:50:49 AM By: Lenda Kelp PA-C Entered By: Lenda Kelp on 06/16/2019 08:50:49

## 2019-06-23 ENCOUNTER — Encounter: Payer: Medicare Other | Attending: Physician Assistant | Admitting: Physician Assistant

## 2019-06-23 ENCOUNTER — Other Ambulatory Visit: Payer: Self-pay

## 2019-06-23 DIAGNOSIS — I10 Essential (primary) hypertension: Secondary | ICD-10-CM | POA: Diagnosis not present

## 2019-06-23 DIAGNOSIS — L97812 Non-pressure chronic ulcer of other part of right lower leg with fat layer exposed: Secondary | ICD-10-CM | POA: Diagnosis not present

## 2019-06-23 NOTE — Progress Notes (Addendum)
AIMIE, WAGMAN ([id number]) Visit Report for 06/23/2019 Chief Complaint Document Details Patient Name: [name], [name]. Date of Service: 06/23/2019 8:00 AM Medical Record Number: [id number] Patient Account Number: [id number] Date of Birth/Sex: November 05, 1950 (69 y.o. F) Treating RN: Curtis Sites Primary Care Provider: Joycelyn Man Other Clinician: Referring Provider: Joycelyn Man Treating Provider/Extender: Linwood Dibbles, Nochum Fenter Weeks in Treatment: 5 Information Obtained from: Patient Chief Complaint Right LE Ulcer with Cellulitis Electronic Signature(s) Signed: 06/23/2019 8:26:52 AM By: Lenda Kelp PA-C Entered By: Lenda Kelp on 06/23/2019 08:26:51 [name] ([id number]) -------------------------------------------------------------------------------- HPI Details Patient Name: [name]. Date of Service: 06/23/2019 8:00 AM Medical Record Number: [id number] Patient Account Number: [id number] Date of Birth/Sex: 12/07/1950 (69 y.o. F) Treating RN: Curtis Sites Primary Care Provider: Joycelyn Man Other Clinician: Referring Provider: Joycelyn Man Treating Provider/Extender: Linwood Dibbles, Parish Dubose Weeks in Treatment: 5 History of Present Illness HPI Description: 05/19/2019 upon evaluation today patient presents for initial inspection here in our clinic concerning an injury that she has to the right lateral lower extremity. She does have associated cellulitis as well. Unfortunately she is continuing to have some discomfort at this point. She has been seen and prescribed doxycycline which she is currently taking that is excellent news. With that being said she fortunately does not appear to have any significant edema of the lower extremities at this point other than right around the wound area I do not think she has venous stasis which should be good for her as far as healing is concerned she is less likely to have complications. We still may want to use something such as  Tubigrip in order to help with just some of the local edema at this time. The patient is in agreement with that plan. I am also can have to likely perform some debridement to clear away some of the necrotic tissue currently as far as the wound surface is concerned. The patient has a history of hypertension but otherwise has no major medical problems. 05/25/2019 upon evaluation today patient appears to be doing roughly the same with regard to her wound. Unfortunately the skin flap that I was hoping would survive does not appear to have. This is going to require debridement in order to clear this away so that the wound can heal appropriately. That was discussed with the patient today and she understands and was completely expecting that based on how it looked as well. I would kind of forewarned her last week. 5/13; patient with a traumatic wound on the right lateral calf area. [Traumatized on [employer] door]. She has been using silver alginate. This was apparently complicated by cellulitis initially. Not currently on antibiotics. 5/20; traumatic wound to the right lateral calf. I changed her to [facility] after debridement last week. Wound is measuring about half a centimeter smaller in length and width. Surface looks healthy under illumination. She is changing the dressing every second 06/16/2019 upon evaluation today patient actually appears to be doing quite well with regard to the wound on her leg. The Hydrofera Blue has done excellent for her and very pleased in this regard. Fortunately there is no signs of active infection at this moment which is also a good sign. Overall I think she is measuring well and headed in the right direction. 06/23/2019 upon evaluation today patient actually appears to be doing quite well with regard to her wounds. She has been tolerating the dressing changes without complication and fortunately there is no evidence of active infection at  this time. No fevers,  chills, nausea, vomiting, or diarrhea. Electronic Signature(s) Signed: 06/23/2019 5:52:36 PM By: Worthy Keeler PA-C Entered By: Worthy Keeler on 06/23/2019 17:52:35 [name] ([id number]) -------------------------------------------------------------------------------- Physical Exam Details Patient Name: Moss, Laura. Date of Service: 06/23/2019 8:00 AM Medical Record Number: [id number] Patient Account Number: [id number] Date of Birth/Sex: February 09, 1950 (69 y.o. F) Treating RN: Montey Hora Primary Care Provider: Fenton Malling Other Clinician: Referring Provider: Fenton Malling Treating Provider/Extender: Melburn Hake, Sierra Spargo Weeks in Treatment: 5 Constitutional Well-nourished and well-hydrated in no acute distress. Respiratory normal breathing without difficulty. Psychiatric this patient is able to make decisions and demonstrates good insight into disease process. Alert and Oriented x 3. pleasant and cooperative. Notes A silverPatient's wound upon inspection appears to be somewhat dry. I think she may benefit from a switch in the dressings the [facility] may be keeping this to dry and therefore slowing down her healing. Up to this point is done excellent and so I have no complaints in that regard. With that being said I am going to suggest at this time that we probably go ahead and switch out to something a little different for her and specifically I am going to recommend collagen dressing which I think would be best. Electronic Signature(s) Signed: 06/23/2019 5:53:31 PM By: Worthy Keeler PA-C Previous Signature: 06/23/2019 5:53:17 PM Version By: Worthy Keeler PA-C Entered By: Worthy Keeler on 06/23/2019 17:53:31 Klabunde, Georgia Duff ([id number]) -------------------------------------------------------------------------------- Physician Orders Details Patient Name: [name]. Date of Service: 06/23/2019 8:00 AM Medical Record Number: [id number] Patient Account Number:  [id number] Date of Birth/Sex: 11-25-1950 (69 y.o. F) Treating RN: Montey Hora Primary Care Provider: Fenton Malling Other Clinician: Referring Provider: Fenton Malling Treating Provider/Extender: Melburn Hake, Dajsha Massaro Weeks in Treatment: 5 Verbal / Phone Orders: No Diagnosis Coding ICD-10 Coding Code Description S81.801A Unspecified open wound, right lower leg, initial encounter L97.812 Non-pressure chronic ulcer of other part of right lower leg with fat layer exposed L03.115 Cellulitis of right lower limb I10 Essential (primary) hypertension Wound Cleansing Wound #1 Right,Lateral Lower Leg o Clean wound with Normal Saline. - in office o Dial antibacterial soap, wash wounds, rinse and pat dry prior to dressing wounds Primary Wound Dressing Wound #1 Right,Lateral Lower Leg o Silver Collagen Secondary Dressing o ABD and Kerlix/Conform Dressing Change Frequency Wound #1 Right,Lateral Lower Leg o Change dressing every other day. Follow-up Appointments Wound #1 Right,Lateral Lower Leg o Return Appointment in 1 week. Edema Control Wound #1 Right,Lateral Lower Leg o Elevate legs to the level of the heart and pump ankles as often as possible o Other: - tubi grip Electronic Signature(s) Signed: 06/23/2019 4:32:38 PM By: Montey Hora Signed: 06/23/2019 6:04:00 PM By: Worthy Keeler PA-C Entered By: Montey Hora on 06/23/2019 08:32:00 [name] ([id number]) -------------------------------------------------------------------------------- Problem List Details Patient Name: ALEC, MCPHEE. Date of Service: 06/23/2019 8:00 AM Medical Record Number: [id number] Patient Account Number: [id number] Date of Birth/Sex: 1951/01/12 (69 y.o. F) Treating RN: Montey Hora Primary Care Provider: Fenton Malling Other Clinician: Referring Provider: Fenton Malling Treating Provider/Extender: Melburn Hake, Darrelle Wiberg Weeks in Treatment: 5 Active Problems ICD-10 Encounter Code  Description Active Date MDM Diagnosis S81.801A Unspecified open wound, right lower leg, initial encounter 05/18/2019 No Yes L97.812 Non-pressure chronic ulcer of other part of right lower leg with fat layer 05/18/2019 No Yes exposed L03.115 Cellulitis of right lower limb 05/18/2019 No Yes I10 Essential (primary) hypertension 05/18/2019 No Yes Inactive Problems Resolved Problems Electronic Signature(s)  Signed: 06/23/2019 8:26:46 AM By: Lenda KelpStone III, Soriah Leeman PA-C Entered By: Lenda KelpStone III, Zalaya Astarita on 06/23/2019 08:26:46 Laura LitesMAYS, Jasmaine M. ([id number]) -------------------------------------------------------------------------------- Progress Note Details Patient Name: Laura LitesMAYS, Madigan M. Date of Service: 06/23/2019 8:00 AM Medical Record Number: [id number] Patient Account Number: [id number] Date of Birth/Sex: 10/25/1950 (69 y.o. F) Treating RN: Curtis Sitesorthy, Joanna Primary Care Provider: Joycelyn ManBurnette, Jennifer Other Clinician: Referring Provider: Joycelyn ManBurnette, Jennifer Treating Provider/Extender: Linwood DibblesSTONE III, Christos Mixson Weeks in Treatment: 5 Subjective Chief Complaint Information obtained from Patient Right LE Ulcer with Cellulitis History of Present Illness (HPI) 05/19/2019 upon evaluation today patient presents for initial inspection here in our clinic concerning an injury that she has to the right lateral lower extremity. She does have associated cellulitis as well. Unfortunately she is continuing to have some discomfort at this point. She has been seen and prescribed doxycycline which she is currently taking that is excellent news. With that being said she fortunately does not appear to have any significant edema of the lower extremities at this point other than right around the wound area I do not think she has venous stasis which should be good for her as far as healing is concerned she is less likely to have complications. We still may want to use something such as Tubigrip in order to help with just some of the local edema at this  time. The patient is in agreement with that plan. I am also can have to likely perform some debridement to clear away some of the necrotic tissue currently as far as the wound surface is concerned. The patient has a history of hypertension but otherwise has no major medical problems. 05/25/2019 upon evaluation today patient appears to be doing roughly the same with regard to her wound. Unfortunately the skin flap that I was hoping would survive does not appear to have. This is going to require debridement in order to clear this away so that the wound can heal appropriately. That was discussed with the patient today and she understands and was completely expecting that based on how it looked as well. I would kind of forewarned her last week. 5/13; patient with a traumatic wound on the right lateral calf area. [Traumatized on [employer] door]. She has been using silver alginate. This was apparently complicated by cellulitis initially. Not currently on antibiotics. 5/20; traumatic wound to the right lateral calf. [facility] debrid[facility] last week. Wound is measuring about half a centimeter smaller in length and width. Surface looks healthy under illumination. She is changing the dressing every second 06/16/2019 upon evaluation today patient actually appears to be doing quite well with regard to the wound on her leg. The Hydrofera Blue has done excellent for her and very pleased in this regard. Fortunately there is no signs of active infection at this moment which is also a good sign. Overall I think she is measuring well and headed in the right direction. 06/23/2019 upon evaluation today patient actually appears to be doing quite well with regard to her wounds. She has been tolerating the dressing changes without complication and fortunately there is no evidence of active infection at this time. No fevers, chills, nausea, vomiting, or  diarrhea. Objective Constitutional Well-nourished and well-hydrated in no acute distress. Vitals Time Taken: 8:06 AM, Height: 66 in, Weight: 130 lbs, BMI: 21, Temperature: 97.8 F, Pulse: 79 bpm, Respiratory Rate: 16 breaths/min, Blood Pressure: 142/76 mmHg. Respiratory normal breathing without difficulty. Psychiatric this patient is able to make decisions and demonstrates good insight  into disease process. Alert and Oriented x 3. pleasant and cooperative. General Notes: A silverPatient's wound upon inspection appears to be somewhat dry. I think she may benefit from a switch in the dressings the [facility] may be keeping this to dry and therefore slowing down her healing. Up to this point is done excellent and so I have no complaints in that regard. With that being said I am going to suggest at this time that we probably go ahead and switch out to something a little different for her and specifically I am going to recommend collagen dressing which I think would be best. Integumentary (Hair, Skin) Wound #1 status is Open. Original cause of wound was Trauma. The wound is located on the Right,Lateral Lower Leg. The wound measures 2.3cm length x 1.6cm width x 0.1cm depth; 2.89cm^2 area and 0.289cm^3 volume. There is Fat Layer (Subcutaneous Tissue) Exposed exposed. BETHANIE, BLOXOM. ([id number]) There is a medium amount of serous drainage noted. The wound margin is flat and intact. There is large (67-100%) pink granulation within the wound bed. There is a small (1-33%) amount of necrotic tissue within the wound bed including Adherent Slough. Assessment Active Problems ICD-10 Unspecified open wound, right lower leg, initial encounter Non-pressure chronic ulcer of other part of right lower leg with fat layer exposed Cellulitis of right lower limb Essential (primary) hypertension Plan Wound Cleansing: Wound #1 Right,Lateral Lower Leg: Clean wound with Normal Saline. - in office Dial  antibacterial soap, wash wounds, rinse and pat dry prior to dressing wounds Primary Wound Dressing: Wound #1 Right,Lateral Lower Leg: Silver Collagen Secondary Dressing: ABD and Kerlix/Conform Dressing Change Frequency: Wound #1 Right,Lateral Lower Leg: Change dressing every other day. Follow-up Appointments: Wound #1 Right,Lateral Lower Leg: Return Appointment in 1 week. Edema Control: Wound #1 Right,Lateral Lower Leg: Elevate legs to the level of the heart and pump ankles as often as possible Other: - tubi grip 1. I would recommend currently that we switch to silver collagen as opposed to the [facility] currently I think this may do better for the patient. 2. I am also can recommend currently that the patient continue to monitor for any signs of infection though again the main thing she is looking out for again is any redness or increased pain. Obviously we do have minimal swelling noted here and she needs to elevate her legs if she notices any swelling. We will see patient back for reevaluation in 1 week here in the clinic. If anything worsens or changes patient will contact our office for additional recommendations. Electronic Signature(s) Signed: 06/23/2019 5:53:58 PM By: Lenda Kelp PA-C Entered By: Lenda Kelp on 06/23/2019 17:53:57 Butterbaugh, Orlin Hilding ([id number]) -------------------------------------------------------------------------------- SuperBill Details Patient Name: [name]. Date of Service: 06/23/2019 Medical Record Number: [id number] Patient Account Number: [id number] Date of Birth/Sex: January 15, 1951 (69 y.o. F) Treating RN: Curtis Sites Primary Care Provider: Joycelyn Man Other Clinician: Referring Provider: Joycelyn Man Treating Provider/Extender: Linwood Dibbles, Dravon Nott Weeks in Treatment: 5 Diagnosis Coding ICD-10 Codes Code Description [phone] Unspecified open wound, right lower leg, initial encounter L97.812 Non-pressure chronic ulcer of  other part of right lower leg with fat layer exposed L03.115 Cellulitis of right lower limb I10 Essential (primary) hypertension Facility Procedures CPT4 Code: [id number] Description: 99213 - WOUND CARE VISIT-LEV 3 EST PT Modifier: Quantity: 1 Physician Procedures CPT4 Code: [id number] Description: 99213 - WC PHYS LEVEL 3 - EST PT Modifier: Quantity: 1 CPT4 Code: Description: ICD-10 Diagnosis Description S81.801A Unspecified open wound, right  lower leg, initial encounter L97.812 Non-pressure chronic ulcer of other part of right lower leg with fat la L03.115 Cellulitis of right lower limb I10 Essential (primary)  hypertension Modifier: yer exposed Quantity: Electronic Signature(s) Signed: 06/23/2019 5:54:24 PM By: Lenda Kelp PA-C Entered By: Lenda Kelp on 06/23/2019 17:54:23

## 2019-06-23 NOTE — Progress Notes (Signed)
[name], [name] ([id number]) Visit Report for 06/23/2019 Arrival Information Details Patient Name: [name], [name]. Date of Service: 06/23/2019 8:00 AM Medical Record Number: [id number] Patient Account Number: [id number] Date of Birth/Sex: May 02, 1950 (69 y.o. F) Treating RN: Rodell Perna Primary Care Novia Lansberry: Joycelyn Man Other Clinician: Referring Krissia Schreier: Joycelyn Man Treating Axle Parfait/Extender: Linwood Dibbles, HOYT Weeks in Treatment: 5 Visit Information History Since Last Visit Added or deleted any medications: No Patient Arrived: Ambulatory Any new allergies or adverse reactions: No Arrival Time: 08:06 Had a fall or experienced change in No Accompanied By: self activities of daily living that may affect Transfer Assistance: None risk of falls: Patient Identification Verified: Yes Signs or symptoms of abuse/neglect since last visito No Hospitalized since last visit: No Has Dressing in Place as Prescribed: Yes Pain Present Now: No Electronic Signature(s) Signed: 06/23/2019 3:23:06 PM By: Rodell Perna Entered By: Rodell Perna on 06/23/2019 08:06:20 [name] ([id number]) -------------------------------------------------------------------------------- Clinic Level of Care Assessment Details Patient Name: [name]. Date of Service: 06/23/2019 8:00 AM Medical Record Number: [id number] Patient Account Number: [id number] Date of Birth/Sex: 30-Jun-1950 (69 y.o. F) Treating RN: Curtis Sites Primary Care Oneil Behney: Joycelyn Man Other Clinician: Referring Braylea Brancato: Joycelyn Man Treating Andi Mahaffy/Extender: Linwood Dibbles, HOYT Weeks in Treatment: 5 Clinic Level of Care Assessment Items TOOL 4 Quantity Score []  - Use when only an EandM is performed on FOLLOW-UP visit 0 ASSESSMENTS - Nursing Assessment / Reassessment X - Reassessment of Co-morbidities (includes updates in patient status) 1 10 X- 1 5 Reassessment of Adherence to Treatment Plan ASSESSMENTS - Wound and  Skin Assessment / Reassessment X - Simple Wound Assessment / Reassessment - one wound 1 5 []  - 0 Complex Wound Assessment / Reassessment - multiple wounds []  - 0 Dermatologic / Skin Assessment (not related to wound area) ASSESSMENTS - Focused Assessment []  - Circumferential Edema Measurements - multi extremities 0 []  - 0 Nutritional Assessment / Counseling / Intervention X- 1 5 Lower Extremity Assessment (monofilament, tuning fork, pulses) []  - 0 Peripheral Arterial Disease Assessment (using hand held doppler) ASSESSMENTS - Ostomy and/or Continence Assessment and Care []  - Incontinence Assessment and Management 0 []  - 0 Ostomy Care Assessment and Management (repouching, etc.) PROCESS - Coordination of Care X - Simple Patient / Family Education for ongoing care 1 15 []  - 0 Complex (extensive) Patient / Family Education for ongoing care X- 1 10 Staff obtains , Records, Test Results / Process Orders []  - 0 Staff telephones HHA, Nursing Homes / Clarify orders / etc []  - 0 Routine Transfer to another Facility (non-emergent condition) []  - 0 Routine Hospital Admission (non-emergent condition) []  - 0 New Admissions / / Ordering NPWT, Apligraf, etc. []  - 0 Emergency Hospital Admission (emergent condition) X- 1 10 Simple Discharge Coordination []  - 0 Complex (extensive) Discharge Coordination PROCESS - Special Needs []  - Pediatric / Minor Patient Management 0 []  - 0 Isolation Patient Management []  - 0 Hearing / Language / Visual special needs []  - 0 Assessment of Community assistance (transportation, D/C planning, etc.) []  - 0 Additional assistance / Altered mentation []  - 0 Support Surface(s) Assessment (bed, cushion, seat, etc.) INTERVENTIONS - Wound Cleansing / Measurement Moss, Laura M. ( ) X- 1 5 Simple Wound Cleansing - one wound []  - 0 Complex Wound Cleansing - multiple wounds X- 1 5 Wound Imaging (photographs - any  number of wounds) []  - 0 Wound Tracing (instead of photographs) X- 1 5 Simple Wound Measurement - one wound []  -  0 Complex Wound Measurement - multiple wounds INTERVENTIONS - Wound Dressings X - Small Wound Dressing one or multiple wounds 1 10 []  - 0 Medium Wound Dressing one or multiple wounds []  - 0 Large Wound Dressing one or multiple wounds []  - 0 Application of Medications - topical []  - 0 Application of Medications - injection INTERVENTIONS - Miscellaneous []  - External ear exam 0 []  - 0 Specimen Collection (cultures, biopsies, blood, body fluids, etc.) []  - 0 Specimen(s) / Culture(s) sent or taken to Lab for analysis []  - 0 Patient Transfer (multiple staff / / Similar devices) []  - 0 Simple Staple / Suture removal (25 or less) []  - 0 Complex Staple / Suture removal (26 or more) []  - 0 Hypo / Hyperglycemic Management (close monitor of Blood Glucose) []  - 0 Ankle / Brachial Index (ABI) - do not check if billed separately X- 1 5 Vital Signs Has the patient been seen at the hospital within the last three years: Yes Total Score: 90 Level Of Care: New/Established - Level 3 Electronic Signature(s) Signed: 06/23/2019 4:32:38 PM By: Entered By: on 06/23/2019 08:50:35 ( ) -------------------------------------------------------------------------------- Encounter Discharge Information Details Patient Name: . Date of Service: 06/23/2019 8:00 AM Medical Record Number: Patient Account Number: Date of Birth/Sex: 1950/12/02 (69 y.o. F) Treating RN: 08/23/2019 Primary Care Eufelia Veno: Curtis Sites Other Clinician: Referring Venisa Frampton: Curtis Sites Treating Shahmeer Bunn/Extender: 08/23/2019, HOYT Weeks in Treatment: 5 Encounter Discharge Information Items Discharge Condition: Stable Ambulatory Status: Ambulatory Discharge Destination: Home Transportation: Private  Auto Accompanied By: self Schedule Follow-up Appointment: Yes Clinical Summary of Care: Electronic Signature(s) Signed: 06/23/2019 4:32:38 PM By: [id number] Entered By: [name] on 06/23/2019 08:51:36 [place], [id number] ([id number]) -------------------------------------------------------------------------------- Lower Extremity Assessment Details Patient Name: 14/07/1950. Date of Service: 06/23/2019 8:00 AM Medical Record Number: Curtis Sites Patient Account Number: Joycelyn Man Date of Birth/Sex: Sep 24, 1950 (69 y.o. F) Treating RN: 08/23/2019 Primary Care Rayni Nemitz: Curtis Sites Other Clinician: Referring Arlander Gillen: Curtis Sites Treating My Mad[place]/Extender: 08/23/2019, HOYT Weeks in Treatment: 5 Edema Assessment Assessed: [Left: No] [Right: No] Edema: [Left: N] [Right: o] Vascular Assessment Pulses: Dorsalis Pedis Palpable: [Right:Yes] Electronic Signature(s) Signed: 06/23/2019 3:23:06 PM By: [id number] Entered By: [name] on 06/23/2019 08:10:17 Moss, Laura M. ([id number]) -------------------------------------------------------------------------------- Multi Wound Chart Details Patient Name: [id number]. Date of Service: 06/23/2019 8:00 AM Medical Record Number: [id number] Patient Account Number: Rodell Perna Date of Birth/Sex: 1950-03-22 (69 y.o. F) Treating RN: Linwood Dibbles Primary Care Tarika Mckethan: 08/23/2019 Other Clinician: Referring Kennedee Kitzmiller: Rodell Perna Treating Katsumi Wisler/Extender: Rodell Perna, HOYT Weeks in Treatment: 5 Vital Signs Height(in): 66 Pulse(bpm): 79 Weight(lbs): 130 Blood Pressure(mmHg): 142/76 Body Mass Index(BMI): 21 Temperature(F): 97.8 Respiratory Rate(breaths/min): 16 Photos: [N/A:N/A] Wound Location: Right, Lateral Lower Leg N/A N/A Wounding Event: Trauma N/A N/A Primary Etiology: Trauma, Other N/A N/A Comorbid History: Hypertension N/A N/A Date Acquired: 05/10/2019 N/A N/A Weeks of Treatment: 5 N/A N/A Wound  Status: Open N/A N/A Measurements L x W x D (cm) 2.3x1.6x0.1 N/A N/A Area (cm) : 2.89 N/A N/A Volume (cm) : 0.289 N/A N/A % Reduction in Area: 63.90% N/A N/A % Reduction in Volume: 88.00% N/A N/A Classification: Full Thickness Without Exposed N/A N/A Support Structures Exudate Amount: Medium N/A N/A Exudate Type: Serous N/A N/A Exudate Color: amber N/A N/A Wound Margin: Flat and Intact N/A N/A Granulation Amount: Large (67-100%) N/A N/A Granulation Quality: Pink N/A N/A Necrotic Amount: Small (1-33%) N/A  N/A Exposed Structures: Fat Layer (Subcutaneous Tissue) N/A N/A Exposed: Yes Fascia: No Tendon: No Muscle: No Joint: No Bone: No Epithelialization: None N/A N/A Treatment Notes Electronic Signature(s) Signed: 06/23/2019 4:32:38 PM By: Curtis Sites Entered By: Curtis Sites on 06/23/2019 08:30:37 [name] ([id number]) -------------------------------------------------------------------------------- Multi-Disciplinary Care Plan Details Patient Name: [name]. Date of Service: 06/23/2019 8:00 AM Medical Record Number: [id number] Patient Account Number: [id number] Date of Birth/Sex: 1950-10-04 (69 y.o. F) Treating RN: Curtis Sites Primary Care Cristofher Livecchi: Joycelyn Man Other Clinician: Referring Rachael Ferrie: Joycelyn Man Treating Rogena Deupree/Extender: Linwood Dibbles, HOYT Weeks in Treatment: 5 Active Inactive Orientation to the Wound Care Program Nursing Diagnoses: Knowledge deficit related to the wound healing center program Goals: Patient/caregiver will verbalize understanding of the Wound Healing Center Program Date Initiated: 05/18/2019 Target Resolution Date: 05/26/2019 Goal Status: Active Interventions: Provide education on orientation to the wound center Notes: Wound/Skin Impairment Nursing Diagnoses: Impaired tissue integrity Goals: Ulcer/skin breakdown will have a volume reduction of 30% by week 4 Date Initiated: 05/18/2019 Target Resolution Date:  06/15/2019 Goal Status: Active Interventions: Assess ulceration(s) every visit Notes: Electronic Signature(s) Signed: 06/23/2019 4:32:38 PM By: Curtis Sites Entered By: Curtis Sites on 06/23/2019 08:30:01 [name] ([id number]) -------------------------------------------------------------------------------- Pain Assessment Details Patient Name: [name]. Date of Service: 06/23/2019 8:00 AM Medical Record Number: [id number] Patient Account Number: [id number] Date of Birth/Sex: [birth year] (69 y.o. F) Treating RN: Rodell Perna Primary Care Trae Bovenzi: Joycelyn Man Other Clinician: Referring Nolan Lasser: Joycelyn Man Treating Chon Buhl/Extender: Linwood Dibbles, HOYT Weeks in Treatment: 5 Active Problems Location of Pain Severity and Description of Pain Patient Has Paino No Site Locations Pain Management and Medication Current Pain Management: Electronic Signature(s) Signed: 06/23/2019 3:23:06 PM By: Rodell Perna Entered By: Rodell Perna on 06/23/2019 08:08:48 [name] ([id number]) -------------------------------------------------------------------------------- Patient/Caregiver Education Details Patient Name: [name]. Date of Service: 06/23/2019 8:00 AM Medical Record Number: [id number] Patient Account Number: [id number] Date of Birth/Gender: 01/30/1950 (69 y.o. F) Treating RN: Curtis Sites Primary Care Physician: Joycelyn Man Other Clinician: Referring Physician: Joycelyn Man Treating Physician/Extender: Skeet Simmer in Treatment: 5 Education Assessment Education Provided To: Patient Education Topics Provided Wound/Skin Impairment: Handouts: Other: wound care as ordered Methods: Demonstration, Explain/Verbal Responses: State content correctly Electronic Signature(s) Signed: 06/23/2019 4:32:38 PM By: Curtis Sites Entered By: Curtis Sites on 06/23/2019 08:50:55 Moss, Laura M.  ([id number]) -------------------------------------------------------------------------------- Wound Assessment Details Patient Name: [name]. Date of Service: 06/23/2019 8:00 AM Medical Record Number: [id number] Patient Account Number: [id number] Date of Birth/Sex: 28-Sep-1950 (69 y.o. F) Treating RN: Rodell Perna Primary Care Kirkland Figg: Joycelyn Man Other Clinician: Referring Jovane Foutz: Joycelyn Man Treating Tristain Daily/Extender: Linwood Dibbles, HOYT Weeks in Treatment: 5 Wound Status Wound Number: 1 Primary Etiology: Trauma, Other Wound Location: Right, Lateral Lower Leg Wound Status: Open Wounding Event: Trauma Comorbid History: Hypertension Date Acquired: 05/10/2019 Weeks Of Treatment: 5 Clustered Wound: No Photos Wound Measurements Length: (cm) 2.3 Width: (cm) 1.6 Depth: (cm) 0.1 Area: (cm) 2.89 Volume: (cm) 0.289 % Reduction in Area: 63.9% % Reduction in Volume: 88% Epithelialization: None Wound Description Classification: Full Thickness Without Exposed Support Structu Wound Margin: Flat and Intact Exudate Amount: Medium Exudate Type: Serous Exudate Color: amber res Foul Odor After Cleansing: No Slough/Fibrino Yes Wound Bed Granulation Amount: Large (67-100%) Exposed Structure Granulation Quality: Pink Fascia Exposed: No Necrotic Amount: Small (1-33%) Fat Layer (Subcutaneous Tissue) Exposed: Yes Necrotic Quality: Adherent Slough Tendon Exposed: No Muscle Exposed: No Joint Exposed: No Bone Exposed: No Treatment Notes Wound #1 (Right, Lateral  Lower Leg) Notes prisma, ABD, conform, tubi F Electronic Signature(s) Signed: 06/23/2019 3:23:06 PM By: Wallene Dales ([id number]) Entered By: Army Melia on 06/23/2019 08:09:06 [name] ([id number]) -------------------------------------------------------------------------------- Vitals Details Patient Name: [name]. Date of Service: 06/23/2019 8:00 AM Medical Record Number:  [id number] Patient Account Number: [id number] Date of Birth/Sex: 26-Aug-1950 (69 y.o. F) Treating RN: Army Melia Primary Care Chung Chagoya: Fenton Malling Other Clinician: Referring Lakeysha Slutsky: Fenton Malling Treating Lyall Faciane/Extender: Melburn Hake, HOYT Weeks in Treatment: 5 Vital Signs Time Taken: 08:06 Temperature (F): 97.8 Height (in): 66 Pulse (bpm): 79 Weight (lbs): 130 Respiratory Rate (breaths/min): 16 Body Mass Index (BMI): 21 Blood Pressure (mmHg): 142/76 Reference Range: 80 - 120 mg / dl Electronic Signature(s) Signed: 06/23/2019 3:23:06 PM By: Army Melia Entered By: Army Melia on 06/23/2019 08:06:33

## 2019-06-30 ENCOUNTER — Other Ambulatory Visit: Payer: Self-pay

## 2019-06-30 ENCOUNTER — Encounter: Payer: Medicare Other | Admitting: Physician Assistant

## 2019-06-30 DIAGNOSIS — L97812 Non-pressure chronic ulcer of other part of right lower leg with fat layer exposed: Secondary | ICD-10-CM | POA: Diagnosis not present

## 2019-06-30 NOTE — Progress Notes (Signed)
JAZZIE, TRAMPE ([id number]) Visit Report for 06/30/2019 Arrival Information Details Patient Name: [name], [name]. Date of Service: 06/30/2019 8:00 AM Medical Record Number: [id number] Patient Account Number: [id number] Date of Birth/Sex: 25-Mar-1950 (69 y.o. F) Treating RN: Montey Hora Primary Care Teasha Murrillo: Fenton Malling Other Clinician: Referring Aryona Sill: Fenton Malling Treating Alsie Younes/Extender: Melburn Hake, HOYT Weeks in Treatment: 6 Visit Information History Since Last Visit Added or deleted any medications: No Patient Arrived: Ambulatory Any new allergies or adverse reactions: No Arrival Time: 08:11 Had a fall or experienced change in No Accompanied By: self activities of daily living that may affect Transfer Assistance: None risk of falls: Patient Identification Verified: Yes Signs or symptoms of abuse/neglect since last visito No Secondary Verification Process Completed: Yes Hospitalized since last visit: No Implantable device outside of the clinic excluding No cellular tissue based products placed in the center since last visit: Has Dressing in Place as Prescribed: Yes Pain Present Now: No Electronic Signature(s) Signed: 06/30/2019 3:58:18 PM By: Montey Hora Entered By: Montey Hora on 06/30/2019 08:13:20 [name] ([id number]) -------------------------------------------------------------------------------- Encounter Discharge Information Details Patient Name: [name]. Date of Service: 06/30/2019 8:00 AM Medical Record Number: [id number] Patient Account Number: [id number] Date of Birth/Sex: 11-16-1950 (69 y.o. F) Treating RN: Montey Hora Primary Care Santiana Glidden: Fenton Malling Other Clinician: Referring Shallen Luedke: Fenton Malling Treating Zully Frane/Extender: Melburn Hake, HOYT Weeks in Treatment: 6 Encounter Discharge Information Items Post Procedure Vitals Discharge Condition: Stable Temperature (F): 98.2 Ambulatory Status:  Ambulatory Pulse (bpm): 72 Discharge Destination: Home Respiratory Rate (breaths/min): 16 Transportation: Private Auto Blood Pressure (mmHg): 131/57 Accompanied By: self Schedule Follow-up Appointment: Yes Clinical Summary of Care: Electronic Signature(s) Signed: 06/30/2019 3:58:18 PM By: Montey Hora Entered By: Montey Hora on 06/30/2019 08:30:22 [name] ([id number]) -------------------------------------------------------------------------------- Lower Extremity Assessment Details Patient Name: [name]. Date of Service: 06/30/2019 8:00 AM Medical Record Number: [id number] Patient Account Number: [id number] Date of Birth/Sex: [birth year] (69 y.o. F) Treating RN: Montey Hora Primary Care Usiel Astarita: Fenton Malling Other Clinician: Referring Shateria Paternostro: Fenton Malling Treating Daizee Firmin/Extender: Melburn Hake, HOYT Weeks in Treatment: 6 Edema Assessment Assessed: [Left: No] [Right: No] Edema: [Left: N] [Right: o] Vascular Assessment Pulses: Dorsalis Pedis Palpable: [Right:Yes] Electronic Signature(s) Signed: 06/30/2019 3:58:18 PM By: Montey Hora Entered By: Montey Hora on 06/30/2019 08:13:55 Meyers, Aima M. ([id number]) -------------------------------------------------------------------------------- Multi Wound Chart Details Patient Name: [name]. Date of Service: 06/30/2019 8:00 AM Medical Record Number: [id number] Patient Account Number: [id number] Date of Birth/Sex: Jan 29, 1950 (69 y.o. F) Treating RN: Montey Hora Primary Care Marquel Pottenger: Fenton Malling Other Clinician: Referring Ame Heagle: Fenton Malling Treating Vylette Strubel/Extender: Melburn Hake, HOYT Weeks in Treatment: 6 Vital Signs Height(in): 66 Pulse(bpm): 72 Weight(lbs): 130 Blood Pressure(mmHg): 131/57 Body Mass Index(BMI): 21 Temperature(F): 98.2 Respiratory Rate(breaths/min): 16 Photos: [N/A:N/A] Wound Location: Right, Lateral Lower Leg N/A N/A Wounding Event: Trauma N/A  N/A Primary Etiology: Trauma, Other N/A N/A Comorbid History: Hypertension N/A N/A Date Acquired: 05/10/2019 N/A N/A Weeks of Treatment: 6 N/A N/A Wound Status: Open N/A N/A Measurements L x W x D (cm) 1.6x1.1x0.1 N/A N/A Area (cm) : 1.382 N/A N/A Volume (cm) : 0.138 N/A N/A % Reduction in Area: 82.70% N/A N/A % Reduction in Volume: 94.30% N/A N/A Classification: Full Thickness Without Exposed N/A N/A Support Structures Exudate Amount: Medium N/A N/A Exudate Type: Serous N/A N/A Exudate Color: amber N/A N/A Wound Margin: Flat and Intact N/A N/A Granulation Amount: Large (67-100%) N/A N/A Granulation Quality: Pink N/A N/A Necrotic Amount: Small (1-33%) N/A N/A Exposed  Structures: Fat Layer (Subcutaneous Tissue) N/A N/A Exposed: Yes Fascia: No Tendon: No Muscle: No Joint: No Bone: No Epithelialization: None N/A N/A Treatment Notes Electronic Signature(s) Signed: 06/30/2019 3:58:18 PM By: Curtis Sites Entered By: Curtis Sites on 06/30/2019 08:20:49 [name] ([id number]) -------------------------------------------------------------------------------- Multi-Disciplinary Care Plan Details Patient Name: [name]. Date of Service: 06/30/2019 8:00 AM Medical Record Number: [id number] Patient Account Number: [id number] Date of Birth/Sex: Feb 11, 1950 (69 y.o. F) Treating RN: Curtis Sites Primary Care Cacie Gaskins: Joycelyn Man Other Clinician: Referring Amaiah Cristiano: Joycelyn Man Treating Elica Almas/Extender: Linwood Dibbles, HOYT Weeks in Treatment: 6 Active Inactive Orientation to the Wound Care Program Nursing Diagnoses: Knowledge deficit related to the wound healing center program Goals: Patient/caregiver will verbalize understanding of the Wound Healing Center Program Date Initiated: 05/18/2019 Target Resolution Date: 05/26/2019 Goal Status: Active Interventions: Provide education on orientation to the wound center Notes: Wound/Skin Impairment Nursing  Diagnoses: Impaired tissue integrity Goals: Ulcer/skin breakdown will have a volume reduction of 30% by week 4 Date Initiated: 05/18/2019 Target Resolution Date: 06/15/2019 Goal Status: Active Interventions: Assess ulceration(s) every visit Notes: Electronic Signature(s) Signed: 06/30/2019 3:58:18 PM By: Curtis Sites Entered By: Curtis Sites on 06/30/2019 08:20:41 [name] ([id number]) -------------------------------------------------------------------------------- Pain Assessment Details Patient Name: [name]. Date of Service: 06/30/2019 8:00 AM Medical Record Number: [id number] Patient Account Number: [id number] Date of Birth/Sex: 07-03-1950 (69 y.o. F) Treating RN: Curtis Sites Primary Care Isaac Dubie: Joycelyn Man Other Clinician: Referring Livingston Denner: Joycelyn Man Treating Kanetra Ho/Extender: Linwood Dibbles, HOYT Weeks in Treatment: 6 Active Problems Location of Pain Severity and Description of Pain Patient Has Paino No Site Locations Pain Management and Medication Current Pain Management: Electronic Signature(s) Signed: 06/30/2019 3:58:18 PM By: Curtis Sites Entered By: Curtis Sites on 06/30/2019 08:13:43 [name] ([id number]) -------------------------------------------------------------------------------- Patient/Caregiver Education Details Patient Name: [name]. Date of Service: 06/30/2019 8:00 AM Medical Record Number: [id number] Patient Account Number: [id number] Date of Birth/Gender: Feb 14, 1950 (69 y.o. F) Treating RN: Curtis Sites Primary Care Physician: Joycelyn Man Other Clinician: Referring Physician: Joycelyn Man Treating Physician/Extender: Skeet Simmer in Treatment: 6 Education Assessment Education Provided To: Patient Education Topics Provided Venous: Handouts: Other: edema management Methods: Explain/Verbal Responses: State content correctly Electronic Signature(s) Signed: 06/30/2019 3:58:18 PM  By: Curtis Sites Entered By: Curtis Sites on 06/30/2019 08:26:43 [name] ([id number]) -------------------------------------------------------------------------------- Wound Assessment Details Patient Name: [name]. Date of Service: 06/30/2019 8:00 AM Medical Record Number: [id number] Patient Account Number: [id number] Date of Birth/Sex: [birth year] (69 y.o. F) Treating RN: Curtis Sites Primary Care Vidal Lampkins: Joycelyn Man Other Clinician: Referring Tiffanni Scarfo: Joycelyn Man Treating Ruqayyah Lute/Extender: Linwood Dibbles, HOYT Weeks in Treatment: 6 Wound Status Wound Number: 1 Primary Etiology: Trauma, Other Wound Location: Right, Lateral Lower Leg Wound Status: Open Wounding Event: Trauma Comorbid History: Hypertension Date Acquired: 05/10/2019 Weeks Of Treatment: 6 Clustered Wound: No Photos Wound Measurements Length: (cm) 1.6 Width: (cm) 1.1 Depth: (cm) 0.1 Area: (cm) 1.382 Volume: (cm) 0.138 % Reduction in Area: 82.7% % Reduction in Volume: 94.3% Epithelialization: None Tunneling: No Undermining: No Wound Description Classification: Full Thickness Without Exposed Support Structu Wound Margin: Flat and Intact Exudate Amount: Medium Exudate Type: Serous Exudate Color: amber res Foul Odor After Cleansing: No Slough/Fibrino Yes Wound Bed Granulation Amount: Large (67-100%) Exposed Structure Granulation Quality: Pink Fascia Exposed: No Necrotic Amount: Small (1-33%) Fat Layer (Subcutaneous Tissue) Exposed: Yes Necrotic Quality: Adherent Slough Tendon Exposed: No Muscle Exposed: No Joint Exposed: No Bone Exposed: No Treatment Notes Wound #1 (Right, Lateral Lower Leg)  Notes zinc, hydrofera blue, ABD, conform, tubi F Electronic Signature(s) Signed: 06/30/2019 3:58:18 PM By: Garlan Fillers ([id number]) Entered By: Curtis Sites on 06/30/2019 08:17:47 [name]  ([id number]) -------------------------------------------------------------------------------- Vitals Details Patient Name: [name]. Date of Service: 06/30/2019 8:00 AM Medical Record Number: [id number] Patient Account Number: [id number] Date of Birth/Sex: 11/16/1950 (69 y.o. F) Treating RN: Curtis Sites Primary Care Kambra Beachem: Joycelyn Man Other Clinician: Referring Theo Reither: Joycelyn Man Treating Doyle Kunath/Extender: Linwood Dibbles, HOYT Weeks in Treatment: 6 Vital Signs Time Taken: 08:13 Temperature (F): 98.2 Height (in): 66 Pulse (bpm): 72 Weight (lbs): 130 Respiratory Rate (breaths/min): 16 Body Mass Index (BMI): 21 Blood Pressure (mmHg): 131/57 Reference Range: 80 - 120 mg / dl Electronic Signature(s) Signed: 06/30/2019 3:58:18 PM By: Curtis Sites Entered By: Curtis Sites on 06/30/2019 08:13:37

## 2019-06-30 NOTE — Progress Notes (Addendum)
AHNA, KONKLE ([id number]) Visit Report for 06/30/2019 Chief Complaint Document Details Patient Name: [name], [name]. Date of Service: 06/30/2019 8:00 AM Medical Record Number: [id number] Patient Account Number: [id number] Date of Birth/Sex: 09/13/1950 (68 y.o. F) Treating RN: Montey Hora Primary Care Provider: Fenton Malling Other Clinician: Referring Provider: Fenton Malling Treating Provider/Extender: Melburn Hake, Raquel Racey Weeks in Treatment: 6 Information Obtained from: Patient Chief Complaint Right LE Ulcer with Cellulitis Electronic Signature(s) Signed: 06/30/2019 8:19:05 AM By: Worthy Keeler PA-C Entered By: Worthy Keeler on 06/30/2019 08:19:05 [name] ([id number]) -------------------------------------------------------------------------------- Debridement Details Patient Name: [name]. Date of Service: 06/30/2019 8:00 AM Medical Record Number: [id number] Patient Account Number: [id number] Date of Birth/Sex: 04-Jan-1951 (68 y.o. F) Treating RN: Montey Hora Primary Care Provider: Fenton Malling Other Clinician: Referring Provider: Fenton Malling Treating Provider/Extender: Melburn Hake, Denisa Enterline Weeks in Treatment: 6 Debridement Performed for Wound #1 Right,Lateral Lower Leg Assessment: Performed By: Physician STONE III, Leandria Thier E., PA-C Debridement Type: Debridement Level of Consciousness (Pre- Awake and Alert procedure): Pre-procedure Verification/Time Out Yes - 08:20 Taken: Start Time: 08:20 Pain Control: Lidocaine 4% Topical Solution Total Area Debrided (L x W): 1.6 (cm) x 1.1 (cm) = 1.76 (cm) Tissue and other material Viable, Non-Viable, Slough, Subcutaneous, Slough, Hyper-granulation, Other: dressing material debrided: Level: Skin/Subcutaneous Tissue Debridement Description: Excisional Instrument: Curette Bleeding: Minimum Hemostasis Achieved: Pressure End Time: 08:24 Procedural Pain: 0 Post Procedural Pain: 0 Response to Treatment:  Procedure was tolerated well Level of Consciousness (Post- Awake and Alert procedure): Post Debridement Measurements of Total Wound Length: (cm) 1.6 Width: (cm) 1.1 Depth: (cm) 0.2 Volume: (cm) 0.276 Character of Wound/Ulcer Post Debridement: Improved Post Procedure Diagnosis Same as Pre-procedure Electronic Signature(s) Signed: 06/30/2019 3:56:00 PM By: Worthy Keeler PA-C Signed: 06/30/2019 3:58:18 PM By: Montey Hora Entered By: Montey Hora on 06/30/2019 08:24:28 [name] ([id number]) -------------------------------------------------------------------------------- HPI Details Patient Name: [name]. Date of Service: 06/30/2019 8:00 AM Medical Record Number: [id number] Patient Account Number: [id number] Date of Birth/Sex: 12-15-1950 (68 y.o. F) Treating RN: Montey Hora Primary Care Provider: Fenton Malling Other Clinician: Referring Provider: Fenton Malling Treating Provider/Extender: Melburn Hake, Divante Kotch Weeks in Treatment: 6 History of Present Illness HPI Description: 05/19/2019 upon evaluation today patient presents for initial inspection here in our clinic concerning an injury that she has to the right lateral lower extremity. She does have associated cellulitis as well. Unfortunately she is continuing to have some discomfort at this point. She has been seen and prescribed doxycycline which she is currently taking that is excellent news. With that being said she fortunately does not appear to have any significant edema of the lower extremities at this point other than right around the wound area I do not think she has venous stasis which should be good for her as far as healing is concerned she is less likely to have complications. We still may want to use something such as Tubigrip in order to help with just some of the local edema at this time. The patient is in agreement with that plan. I am also can have to likely perform some debridement to clear away  some of the necrotic tissue currently as far as the wound surface is concerned. The patient has a history of hypertension but otherwise has no major medical problems. 05/25/2019 upon evaluation today patient appears to be doing roughly the same with regard to her wound. Unfortunately the skin flap that I was hoping would survive does not appear to have. This is  going to require debridement in order to clear this away so that the wound can heal appropriately. That was discussed with the patient today and she understands and was completely expecting that based on how it looked as well. I would kind of forewarned her last week. 5/13; patient with a traumatic wound on the right lateral calf area. [Traumatized on [employer] door]. She has been using silver alginate. This was apparently complicated by cellulitis initially. Not currently on antibiotics. 5/20; traumatic wound to the right lateral calf. I changed her to [facility] after debridement last week. Wound is measuring about half a centimeter smaller in length and width. Surface looks healthy under illumination. She is changing the dressing every second 06/16/2019 upon evaluation today patient actually appears to be doing quite well with regard to the wound on her leg. The Hydrofera Blue has done excellent for her and very pleased in this regard. Fortunately there is no signs of active infection at this moment which is also a good sign. Overall I think she is measuring well and headed in the right direction. 06/23/2019 upon evaluation today patient actually appears to be doing quite well with regard to her wounds. She has been tolerating the dressing changes without complication and fortunately there is no evidence of active infection at this time. No fevers, chills, nausea, vomiting, or diarrhea. 06/30/2019 upon evaluation today patient appears to be doing well with regard to her wound. She had some minimal slough noted on the surface of the wound  wound does appear to be somewhat hyper granular as well. That has been a little bit concerned about the fact that the [facility] which we just switch her from last week may be the better option. I feel like there is too much moisture in the wound bed though it seems to get stuck around the edges of the wound. Maybe we can prevent this by utilizing zinc oxide around the edge to prevent the dressing from sticking. Electronic Signature(s) Signed: 06/30/2019 10:58:29 AM By: Lenda Kelp PA-C Entered By: Lenda Kelp on 06/30/2019 10:58:29 [name] ([id number]) -------------------------------------------------------------------------------- Physical Exam Details Patient Name: [name], [name]. Date of Service: 06/30/2019 8:00 AM Medical Record Number: [id number] Patient Account Number: [id number] Date of Birth/Sex: 03-18-1950 (68 y.o. F) Treating RN: Curtis Sites Primary Care Provider: Joycelyn Man Other Clinician: Referring Provider: Joycelyn Man Treating Provider/Extender: Linwood Dibbles, Mihaela Fajardo Weeks in Treatment: 6 Constitutional Well-nourished and well-hydrated in no acute distress. Respiratory normal breathing without difficulty. Psychiatric this patient is able to make decisions and demonstrates good insight into disease process. Alert and Oriented x 3. pleasant and cooperative. Notes Patient's wound bed did require some sharp debridement to remove some of the necrotic debris from the surface of the wound post debridement wound bed appears to be much better I did get the dry dressing from around the edges of the wound removed as well. Electronic Signature(s) Signed: 06/30/2019 10:58:43 AM By: Lenda Kelp PA-C Entered By: Lenda Kelp on 06/30/2019 10:58:43 [name] ([id number]) -------------------------------------------------------------------------------- Physician Orders Details Patient Name: [name]. Date of Service: 06/30/2019 8:00 AM Medical  Record Number: [id number] Patient Account Number: [id number] Date of Birth/Sex: 1950/10/17 (68 y.o. F) Treating RN: Curtis Sites Primary Care Provider: Joycelyn Man Other Clinician: Referring Provider: Joycelyn Man Treating Provider/Extender: Linwood Dibbles, Shlonda Dolloff Weeks in Treatment: 6 Verbal / Phone Orders: No Diagnosis Coding ICD-10 Coding Code Description S81.801A Unspecified open wound, right lower leg, initial encounter L97.812 Non-pressure chronic  ulcer of other part of right lower leg with fat layer exposed L03.115 Cellulitis of right lower limb I10 Essential (primary) hypertension Wound Cleansing Wound #1 Right,Lateral Lower Leg o Clean wound with Normal Saline. - in office o Dial antibacterial soap, wash wounds, rinse and pat dry prior to dressing wounds Skin Barriers/Peri-Wound Care Wound #1 Right,Lateral Lower Leg o Barrier cream - Desitin cream Primary Wound Dressing Wound #1 Right,Lateral Lower Leg o Hydrafera Blue Ready Transfer Secondary Dressing o ABD and Kerlix/Conform Dressing Change Frequency Wound #1 Right,Lateral Lower Leg o Change dressing every other day. Follow-up Appointments Wound #1 Right,Lateral Lower Leg o Return Appointment in 1 week. Edema Control Wound #1 Right,Lateral Lower Leg o Elevate legs to the level of the heart and pump ankles as often as possible o Other: - tubi grip Electronic Signature(s) Signed: 06/30/2019 3:56:00 PM By: Lenda Kelp PA-C Signed: 06/30/2019 3:58:18 PM By: Curtis Sites Entered By: Curtis Sites on 06/30/2019 08:25:35 [name] ([id number]) -------------------------------------------------------------------------------- Problem List Details Patient Name: [name], [name]. Date of Service: 06/30/2019 8:00 AM Medical Record Number: [id number] Patient Account Number: [id number] Date of Birth/Sex: 01/24/1950 (68 y.o. F) Treating RN: Curtis Sites Primary Care Provider: Joycelyn Man  Other Clinician: Referring Provider: Joycelyn Man Treating Provider/Extender: Linwood Dibbles, Meigan Pates Weeks in Treatment: 6 Active Problems ICD-10 Encounter Code Description Active Date MDM Diagnosis S81.801A Unspecified open wound, right lower leg, initial encounter 05/18/2019 No Yes L97.812 Non-pressure chronic ulcer of other part of right lower leg with fat layer 05/18/2019 No Yes exposed L03.115 Cellulitis of right lower limb 05/18/2019 No Yes I10 Essential (primary) hypertension 05/18/2019 No Yes Inactive Problems Resolved Problems Electronic Signature(s) Signed: 06/30/2019 8:18:37 AM By: Lenda Kelp PA-C Entered By: Lenda Kelp on 06/30/2019 08:18:37 [name] ([id number]) -------------------------------------------------------------------------------- Progress Note Details Patient Name: [name]. Date of Service: 06/30/2019 8:00 AM Medical Record Number: [id number] Patient Account Number: [id number] Date of Birth/Sex: 09/05/1950 (68 y.o. F) Treating RN: Curtis Sites Primary Care Provider: Joycelyn Man Other Clinician: Referring Provider: Joycelyn Man Treating Provider/Extender: Linwood Dibbles, Rosario Duey Weeks in Treatment: 6 Subjective Chief Complaint Information obtained from Patient Right LE Ulcer with Cellulitis History of Present Illness (HPI) 05/19/2019 upon evaluation today patient presents for initial inspection here in our clinic concerning an injury that she has to the right lateral lower extremity. She does have associated cellulitis as well. Unfortunately she is continuing to have some discomfort at this point. She has been seen and prescribed doxycycline which she is currently taking that is excellent news. With that being said she fortunately does not appear to have any significant edema of the lower extremities at this point other than right around the wound area I do not think she has venous stasis which should be good for her as far as healing  is concerned she is less likely to have complications. We still may want to use something such as Tubigrip in order to help with just some of the local edema at this time. The patient is in agreement with that plan. I am also can have to likely perform some debridement to clear away some of the necrotic tissue currently as far as the wound surface is concerned. The patient has a history of hypertension but otherwise has no major medical problems. 05/25/2019 upon evaluation today patient appears to be doing roughly the same with regard to her wound. Unfortunately the skin flap that I was hoping would survive does not appear to have. This is  going to require debridement in order to clear this away so that the wound can heal appropriately. That was discussed with the patient today and she understands and was completely expecting that based on how it looked as well. I would kind of forewarned her last week. 5/13; patient with a traumatic wound on the right lateral calf area. [Traumatized on [employer] door]. She has been using silver alginate. This was apparently complicated by cellulitis initially. Not currently on antibiotics. 5/20; traumatic wound to the right lateral calf. [facility]. Wound is measuring about half a centimeter smaller in length and width. Surface looks healthy under illumination. She is changing the dressing every second 06/16/2019 upon evaluation today patient actually appears to be doing quite well with regard to the wound on her leg. The Hydrofera Blue has done excellent for her and very pleased in this regard. Fortunately there is no signs of active infection at this moment which is also a good sign. Overall I think she is measuring well and headed in the right direction. 06/23/2019 upon evaluation today patient actually appears to be doing quite well with regard to her wounds. She has been tolerating the dressing changes without  complication and fortunately there is no evidence of active infection at this time. No fevers, chills, nausea, vomiting, or diarrhea. 06/30/2019 upon evaluation today patient appears to be doing well with regard to her wound. She had some minimal slough noted on the surface of the wound wound does appear to be somewhat hyper granular as well. That has been a little bit concerned about the fact [facility] week may be the better option. I feel like there is too much moisture in the wound bed though it seems to get stuck around the edges of the wound. Maybe we can prevent this by utilizing zinc oxide around the edge to prevent the dressing from sticking. Objective Constitutional Well-nourished and well-hydrated in no acute distress. Vitals Time Taken: 8:13 AM, Height: 66 in, Weight: 130 lbs, BMI: 21, Temperature: 98.2 F, Pulse: 72 bpm, Respiratory Rate: 16 breaths/min, Blood Pressure: 131/57 mmHg. Respiratory normal breathing without difficulty. Psychiatric this patient is able to make decisions and demonstrates good insight into disease process. Alert and Oriented x 3. pleasant and cooperative. General Notes: Patient's wound bed did require some sharp debridement to remove some of the necrotic debris from the surface of the wound post debridement wound bed appears to be much better I did get the dry dressing from around the edges of the wound removed as well. [name], Laura M. ([id number]) Integumentary (Hair, Skin) Wound #1 status is Open. Original cause of wound was Trauma. The wound is located on the Right,Lateral Lower Leg. The wound measures 1.6cm length x 1.1cm width x 0.1cm depth; 1.382cm^2 area and 0.138cm^3 volume. There is Fat Layer (Subcutaneous Tissue) Exposed exposed. There is no tunneling or undermining noted. There is a medium amount of serous drainage noted. The wound margin is flat and intact. There is large (67-100%) pink granulation  within the wound bed. There is a small (1-33%) amount of necrotic tissue within the wound bed including Adherent Slough. Assessment Active Problems ICD-10 Unspecified open wound, right lower leg, initial encounter Non-pressure chronic ulcer of other part of right lower leg with fat layer exposed Cellulitis of right lower limb Essential (primary) hypertension Procedures Wound #1 Pre-procedure diagnosis of Wound #1 is a Trauma, Other located on  the Right,Lateral Lower Leg . There was a Excisional Skin/Subcutaneous Tissue Debridement with a total area of 1.76 sq cm performed by STONE III, Tage Feggins E., PA-C. With the following instrument(s): Curette to remove Viable and Non-Viable tissue/material. Material removed includes Subcutaneous Tissue, Slough, Hyper-granulation, and Other: dressing material after achieving pain control using Lidocaine 4% Topical Solution. No specimens were taken. A time out was conducted at 08:20, prior to the start of the procedure. A Minimum amount of bleeding was controlled with Pressure. The procedure was tolerated well with a pain level of 0 throughout and a pain level of 0 following the procedure. Post Debridement Measurements: 1.6cm length x 1.1cm width x 0.2cm depth; 0.276cm^3 volume. Character of Wound/Ulcer Post Debridement is improved. Post procedure Diagnosis Wound #1: Same as Pre-Procedure Plan Wound Cleansing: Wound #1 Right,Lateral Lower Leg: Clean wound with Normal Saline. - in office Dial antibacterial soap, wash wounds, rinse and pat dry prior to dressing wounds Skin Barriers/Peri-Wound Care: Wound #1 Right,Lateral Lower Leg: Barrier cream - Desitin cream Primary Wound Dressing: Wound #1 Right,Lateral Lower Leg: Hydrafera Blue Ready Transfer Secondary Dressing: ABD and Kerlix/Conform Dressing Change Frequency: Wound #1 Right,Lateral Lower Leg: Change dressing every other day. Follow-up Appointments: Wound #1 Right,Lateral Lower Leg: Return  Appointment in 1 week. Edema Control: Wound #1 Right,Lateral Lower Leg: Elevate legs to the level of the heart and pump ankles as often as possible Other: - tubi grip 1. I would recommend that we use Desitin cream around the wound and then subsequently the [facility] dressing which I feel like is going to be the best way to go. 2. I am also can suggest at this time that we continue with the Tubigrip to be used most of the time as I think it does help with any swelling which subsequently helps with healing as well. [name], [name]. ([id number]) We will see patient back for reevaluation in 1 week here in the clinic. If anything worsens or changes patient will contact our office for additional recommendations. Electronic Signature(s) Signed: 06/30/2019 10:59:32 AM By: Lenda Kelp PA-C Entered By: Lenda Kelp on 06/30/2019 10:59:32 [name] ([id number]) -------------------------------------------------------------------------------- SuperBill Details Patient Name: [name]. Date of Service: 06/30/2019 Medical Record Number: [id number] Patient Account Number: [id number] Date of Birth/Sex: January 31, 1950 (69 y.o. F) Treating RN: Curtis Sites Primary Care Provider: Joycelyn Man Other Clinician: Referring Provider: Joycelyn Man Treating Provider/Extender: Linwood Dibbles, Anival Pasha Weeks in Treatment: 6 Diagnosis Coding ICD-10 Codes Code Description [phone] Unspecified open wound, right lower leg, initial encounter L97.812 Non-pressure chronic ulcer of other part of right lower leg with fat layer exposed L03.115 Cellulitis of right lower limb I10 Essential (primary) hypertension Facility Procedures CPT4 Code: [id number] Description: 11042 - DEB SUBQ TISSUE 20 SQ CM/< Modifier: Quantity: 1 CPT4 Code: Description: ICD-10 Diagnosis Description L97.812 Non-pressure chronic ulcer of other part of right lower leg with fat lay Modifier: er exposed Quantity: Physician  Procedures CPT4 Code: [id number] Description: 11042 - WC PHYS SUBQ TISS 20 SQ CM Modifier: Quantity: 1 CPT4 Code: Description: ICD-10 Diagnosis Description L97.812 Non-pressure chronic ulcer of other part of right lower leg with fat lay Modifier: er exposed Quantity: Electronic Signature(s) Signed: 06/30/2019 10:59:39 AM By: Lenda Kelp PA-C Entered By: Lenda Kelp on 06/30/2019 10:59:38

## 2019-07-07 ENCOUNTER — Other Ambulatory Visit: Payer: Self-pay

## 2019-07-07 ENCOUNTER — Encounter: Payer: Medicare Other | Admitting: Physician Assistant

## 2019-07-07 DIAGNOSIS — L97812 Non-pressure chronic ulcer of other part of right lower leg with fat layer exposed: Secondary | ICD-10-CM | POA: Diagnosis not present

## 2019-07-07 NOTE — Progress Notes (Signed)
KARMAH, POTOCKI ([id number]) Visit Report for 07/07/2019 Arrival Information Details Patient Name: KAMPBELL, HOLAWAY. Date of Service: 07/07/2019 8:00 AM Medical Record Number: [id number] Patient Account Number: [id number] Date of Birth/Sex: 07/13/1950 (69 y.o. F) Treating RN: Curtis Sites Primary Care Imir Brumbach: Joycelyn Man Other Clinician: Referring Kema Santaella: Joycelyn Man Treating Ariellah Faust/Extender: Linwood Dibbles, HOYT Weeks in Treatment: 7 Visit Information History Since Last Visit Added or deleted any medications: No Patient Arrived: Ambulatory Any new allergies or adverse reactions: No Arrival Time: 08:17 Had a fall or experienced change in No Accompanied By: self activities of daily living that may affect Transfer Assistance: None risk of falls: Patient Identification Verified: Yes Signs or symptoms of abuse/neglect since last visito No Secondary Verification Process Completed: Yes Hospitalized since last visit: No Implantable device outside of the clinic excluding No cellular tissue based products placed in the center since last visit: Has Dressing in Place as Prescribed: Yes Pain Present Now: No Electronic Signature(s) Signed: 07/07/2019 10:33:51 AM By: Karl Ito Entered By: Karl Ito on 07/07/2019 08:17:31 Gatt, Luanna M. ([id number]) -------------------------------------------------------------------------------- Clinic Level of Care Assessment Details Patient Name: [name] Date of Service: 07/07/2019 8:00 AM Medical Record Number: [id number] Patient Account Number: [id number] Date of Birth/Sex: 12-18-1950 (69 y.o. F) Treating RN: Curtis Sites Primary Care Shereena Berquist: Joycelyn Man Other Clinician: Referring Bradley Bostelman: Joycelyn Man Treating Nancy Arvin/Extender: Linwood Dibbles, HOYT Weeks in Treatment: 7 Clinic Level of Care Assessment Items TOOL 4 Quantity Score []  - Use when only an EandM is performed on FOLLOW-UP visit 0 ASSESSMENTS -  Nursing Assessment / Reassessment X - Reassessment of Co-morbidities (includes updates in patient status) 1 10 X- 1 5 Reassessment of Adherence to Treatment Plan ASSESSMENTS - Wound and Skin Assessment / Reassessment X - Simple Wound Assessment / Reassessment - one wound 1 5 []  - 0 Complex Wound Assessment / Reassessment - multiple wounds []  - 0 Dermatologic / Skin Assessment (not related to wound area) ASSESSMENTS - Focused Assessment []  - Circumferential Edema Measurements - multi extremities 0 []  - 0 Nutritional Assessment / Counseling / Intervention X- 1 5 Lower Extremity Assessment (monofilament, tuning fork, pulses) []  - 0 Peripheral Arterial Disease Assessment (using hand held doppler) ASSESSMENTS - Ostomy and/or Continence Assessment and Care []  - Incontinence Assessment and Management 0 []  - 0 Ostomy Care Assessment and Management (repouching, etc.) PROCESS - Coordination of Care X - Simple Patient / Family Education for ongoing care 1 15 []  - 0 Complex (extensive) Patient / Family Education for ongoing care X- 1 10 Staff obtains , Records, Test Results / Process Orders []  - 0 Staff telephones HHA, Nursing Homes / Clarify orders / etc []  - 0 Routine Transfer to another Facility (non-emergent condition) []  - 0 Routine Hospital Admission (non-emergent condition) []  - 0 New Admissions / / Ordering NPWT, Apligraf, etc. []  - 0 Emergency Hospital Admission (emergent condition) X- 1 10 Simple Discharge Coordination []  - 0 Complex (extensive) Discharge Coordination PROCESS - Special Needs []  - Pediatric / Minor Patient Management 0 []  - 0 Isolation Patient Management []  - 0 Hearing / Language / Visual special needs []  - 0 Assessment of Community assistance (transportation, D/C planning, etc.) []  - 0 Additional assistance / Altered mentation []  - 0 Support Surface(s) Assessment (bed, cushion, seat, etc.) INTERVENTIONS - Wound  Cleansing / Measurement Brumm, Heath M. ( ) X- 1 5 Simple Wound Cleansing - one wound []  - 0 Complex Wound Cleansing - multiple wounds X- 1 5 Wound Imaging (photographs -  any number of wounds) []  - 0 Wound Tracing (instead of photographs) X- 1 5 Simple Wound Measurement - one wound []  - 0 Complex Wound Measurement - multiple wounds INTERVENTIONS - Wound Dressings X - Small Wound Dressing one or multiple wounds 1 10 []  - 0 Medium Wound Dressing one or multiple wounds []  - 0 Large Wound Dressing one or multiple wounds []  - 0 Application of Medications - topical []  - 0 Application of Medications - injection INTERVENTIONS - Miscellaneous []  - External ear exam 0 []  - 0 Specimen Collection (cultures, biopsies, blood, body fluids, etc.) []  - 0 Specimen(s) / Culture(s) sent or taken to Lab for analysis []  - 0 Patient Transfer (multiple staff / / Similar devices) []  - 0 Simple Staple / Suture removal (25 or less) []  - 0 Complex Staple / Suture removal (26 or more) []  - 0 Hypo / Hyperglycemic Management (close monitor of Blood Glucose) []  - 0 Ankle / Brachial Index (ABI) - do not check if billed separately X- 1 5 Vital Signs Has the patient been seen at the hospital within the last three years: Yes Total Score: 90 Level Of Care: New/Established - Level 3 Electronic Signature(s) Signed: 07/07/2019 4:17:03 PM By: Entered By: on 07/07/2019 08:33:16 ( ) -------------------------------------------------------------------------------- Encounter Discharge Information Details Patient Name: . Date of Service: 07/07/2019 8:00 AM Medical Record Number: Patient Account Number: Nurse, adult Date of Birth/Sex: 06/25/1950 (69 y.o. F) Treating RN: Primary Care Rigel Filsinger: Other Clinician: Referring Jahkai Yandell: 07/09/2019 Treating Earnestine Shipp/Extender: Curtis Sites, HOYT Weeks in Treatment: 7 Encounter Discharge Information Items Discharge Condition: Stable Ambulatory Status: Ambulatory Discharge Destination: Home Transportation: Private Auto Accompanied By: self Schedule Follow-up Appointment: Yes Clinical Summary of Care: Electronic Signature(s) Signed: 07/07/2019 4:17:03 PM By: 07/09/2019 Entered By: [name] on 07/07/2019 08:34:06 Endsley, [name] (07/09/2019) -------------------------------------------------------------------------------- Lower Extremity Assessment Details Patient Name: [id number]. Date of Service: 07/07/2019 8:00 AM Medical Record Number: 14/07/1950 Patient Account Number: [id number] Date of Birth/Sex: June 27, 1950 (69 y.o. F) Treating RN: Joycelyn Man Primary Care Raphael Espe: Linwood Dibbles Other Clinician: Referring Sonakshi Rolland: 07/09/2019 Treating Albirta Rhinehart/Extender: Curtis Sites, HOYT Weeks in Treatment: 7 Edema Assessment Assessed: [Left: No] [Right: No] Edema: [Left: N] [Right: o] Vascular Assessment Pulses: Dorsalis Pedis Palpable: [Right:Yes] Electronic Signature(s) Signed: 07/07/2019 4:17:03 PM By: 07/09/2019 Entered By: Orlin Hilding on 07/07/2019 08:25:07 [name] (07/09/2019) -------------------------------------------------------------------------------- Multi Wound Chart Details Patient Name: [id number]. Date of Service: 07/07/2019 8:00 AM Medical Record Number: 14/07/1950 Patient Account Number: [id number] Date of Birth/Sex: 25-Feb-1950 (69 y.o. F) Treating RN: Joycelyn Man Primary Care Nesanel Aguila: Linwood Dibbles Other Clinician: Referring Ambrie Carte: 07/09/2019 Treating Veanna Dower/Extender: Curtis Sites, HOYT Weeks in Treatment: 7 Vital Signs Height(in): 66 Pulse(bpm): 83 Weight(lbs): 130 Blood Pressure(mmHg): 133/65 Body Mass Index(BMI): 21 Temperature(F): 97.8 Respiratory Rate(breaths/min): 16 Photos: [1:No Photos] [N/A:N/A] Wound Location: [1:Right,  Lateral Lower Leg] [N/A:N/A] Wounding Event: [1:Trauma] [N/A:N/A] Primary Etiology: [1:Trauma, Other] [N/A:N/A] Comorbid History: [1:Hypertension] [N/A:N/A] Date Acquired: [1:05/10/2019] [N/A:N/A] Weeks of Treatment: [1:7] [N/A:N/A] Wound Status: [1:Open] [N/A:N/A] Measurements L x W x D (cm) [1:1.2x0.7x0.1] [N/A:N/A] Area (cm) : [1:0.66] [N/A:N/A] Volume (cm) : [1:0.066] [N/A:N/A] % Reduction in Area: [1:91.80%] [N/A:N/A] % Reduction in Volume: [1:97.30%] [N/A:N/A] Classification: [1:Full Thickness Without Exposed Support Structures] [N/A:N/A] Exudate Amount: [1:Medium] [N/A:N/A] Exudate Type: [1:Serous] [N/A:N/A] Exudate Color: [1:amber] [N/A:N/A] Wound Margin: [1:Flat and Intact] [N/A:N/A] Granulation Amount: [1:Large (67-100%)] [N/A:N/A] Granulation Quality: [1:Pink] [  N/A:N/A] Necrotic Amount: [1:Small (1-33%)] [N/A:N/A] Exposed Structures: [1:Fat Layer (Subcutaneous Tissue) Exposed: Yes Fascia: No Tendon: No Muscle: No Joint: No Bone: No None] [N/A:N/A N/A] Treatment Notes Electronic Signature(s) Signed: 07/07/2019 4:17:03 PM By: Montey Hora Entered By: Montey Hora on 07/07/2019 08:29:06 [name] ([id number]) -------------------------------------------------------------------------------- [place] Details Patient Name: [name]. Date of Service: 07/07/2019 8:00 AM Medical Record Number: [id number] Patient Account Number: [id number] Date of Birth/Sex: Jul 30, 1950 (69 y.o. F) Treating RN: Montey Hora Primary Care Aitan Rossbach: Fenton Malling Other Clinician: Referring Lankford Gutzmer: Fenton Malling Treating Townsend Cudworth/Extender: Melburn Hake, HOYT Weeks in Treatment: 7 Active Inactive Orientation to the Wound Care Program Nursing Diagnoses: Knowledge deficit related to the wound healing center program Goals: Patient/caregiver will verbalize understanding of the [place] Program Date Initiated: 05/18/2019 Target Resolution Date:  05/26/2019 Goal Status: Active Interventions: Provide education on orientation to the wound center Notes: Wound/Skin Impairment Nursing Diagnoses: Impaired tissue integrity Goals: Ulcer/skin breakdown will have a volume reduction of 30% by week 4 Date Initiated: 05/18/2019 Target Resolution Date: 06/15/2019 Goal Status: Active Interventions: Assess ulceration(s) every visit Notes: Electronic Signature(s) Signed: 07/07/2019 4:17:03 PM By: Montey Hora Entered By: Montey Hora on 07/07/2019 08:28:58 [name] ([id number]) -------------------------------------------------------------------------------- Pain Assessment Details Patient Name: [name]. Date of Service: 07/07/2019 8:00 AM Medical Record Number: [id number] Patient Account Number: [id number] Date of Birth/Sex: [birth year] (69 y.o. F) Treating RN: Montey Hora Primary Care Abrian Hanover: Fenton Malling Other Clinician: Referring Kilian Schwartz: Fenton Malling Treating Hesper Venturella/Extender: Melburn Hake, HOYT Weeks in Treatment: 7 Active Problems Location of Pain Severity and Description of Pain Patient Has Paino No Site Locations Pain Management and Medication Current Pain Management: Electronic Signature(s) Signed: 07/07/2019 10:33:51 AM By: Sandre Kitty Signed: 07/07/2019 4:17:03 PM By: Montey Hora Entered By: Sandre Kitty on 07/07/2019 08:18:09 [name] ([id number]) -------------------------------------------------------------------------------- Patient/Caregiver Education Details Patient Name: JAZIA, FARACI. Date of Service: 07/07/2019 8:00 AM Medical Record Number: [id number] Patient Account Number: [id number] Date of Birth/Gender: July 23, 1950 (69 y.o. F) Treating RN: Montey Hora Primary Care Physician: Fenton Malling Other Clinician: Referring Physician: Fenton Malling Treating Physician/Extender: Sharalyn Ink in Treatment: 7 Education Assessment Education Provided  To: Patient Education Topics Provided Wound/Skin Impairment: Handouts: Other: wound care and leg elevation, s/s to report Methods: Demonstration, Explain/Verbal Responses: State content correctly Electronic Signature(s) Signed: 07/07/2019 4:17:03 PM By: Montey Hora Entered By: Montey Hora on 07/07/2019 08:33:42 Derrick, Sadae M. ([id number]) -------------------------------------------------------------------------------- Wound Assessment Details Patient Name: [name]. Date of Service: 07/07/2019 8:00 AM Medical Record Number: [id number] Patient Account Number: [id number] Date of Birth/Sex: 05-03-1950 (69 y.o. F) Treating RN: Montey Hora Primary Care Wojciech Willetts: Fenton Malling Other Clinician: Referring Ravinder Lukehart: Fenton Malling Treating Danilynn Jemison/Extender: Melburn Hake, HOYT Weeks in Treatment: 7 Wound Status Wound Number: 1 Primary Etiology: Trauma, Other Wound Location: Right, Lateral Lower Leg Wound Status: Open Wounding Event: Trauma Comorbid History: Hypertension Date Acquired: 05/10/2019 Weeks Of Treatment: 7 Clustered Wound: No Photos Wound Measurements Length: (cm) 1.2 Width: (cm) 0.7 Depth: (cm) 0.1 Area: (cm) 0.66 Volume: (cm) 0.066 % Reduction in Area: 91.8% % Reduction in Volume: 97.3% Epithelialization: None Tunneling: No Undermining: No Wound Description Classification: Full Thickness Without Exposed Support Structu Wound Margin: Flat and Intact Exudate Amount: Medium Exudate Type: Serous Exudate Color: amber res Foul Odor After Cleansing: No Slough/Fibrino Yes Wound Bed Granulation Amount: Large (67-100%) Exposed Structure Granulation Quality: Pink Fascia Exposed: No Necrotic Amount: Small (1-33%) Fat Layer (Subcutaneous Tissue) Exposed: Yes Necrotic Quality: Adherent Slough Tendon  Exposed: No Muscle Exposed: No Joint Exposed: No Bone Exposed: No Treatment Notes Wound #1 (Right, Lateral Lower Leg) Notes zinc, hydrofera blue,  ABD, conform, tubi F Electronic Signature(s) Signed: 07/07/2019 10:33:51 AM By: Margreta Journey ([id number]) Signed: 07/07/2019 4:17:03 PM By: Curtis Sites Entered By: Karl Ito on 07/07/2019 08:45:21 Zielinski, Orlin Hilding ([id number]) -------------------------------------------------------------------------------- Vitals Details Patient Name: [name] Date of Service: 07/07/2019 8:00 AM Medical Record Number: [id number] Patient Account Number: [id number] Date of Birth/Sex: 1950/06/26 (69 y.o. F) Treating RN: Curtis Sites Primary Care Cullen Lahaie: Joycelyn Man Other Clinician: Referring Danielle Lento: Joycelyn Man Treating Wynnie Pacetti/Extender: Linwood Dibbles, HOYT Weeks in Treatment: 7 Vital Signs Time Taken: 08:17 Temperature (F): 97.8 Height (in): 66 Pulse (bpm): 83 Weight (lbs): 130 Respiratory Rate (breaths/min): 16 Body Mass Index (BMI): 21 Blood Pressure (mmHg): 133/65 Reference Range: 80 - 120 mg / dl Electronic Signature(s) Signed: 07/07/2019 10:33:51 AM By: Karl Ito Entered By: Karl Ito on 07/07/2019 08:18:04

## 2019-07-07 NOTE — Progress Notes (Addendum)
[name] ([id number]) Visit Report for 07/07/2019 Chief Complaint Document Details Patient Name: [name], [name] Date of Service: 07/07/2019 8:00 AM Medical Record Number: [id number] Patient Account Number: [id number] Date of Birth/Sex: 1950/09/11 (69 y.o. F) Treating RN: Curtis Sites Primary Care Provider: Joycelyn Man Other Clinician: Referring Provider: Joycelyn Man Treating Provider/Extender: Linwood Dibbles, Connee Ikner Weeks in Treatment: 7 Information Obtained from: Patient Chief Complaint Right LE Ulcer with Cellulitis Electronic Signature(s) Signed: 07/07/2019 8:25:15 AM By: Lenda Kelp PA-C Entered By: Lenda Kelp on 07/07/2019 08:25:15 [name] ([id number]) -------------------------------------------------------------------------------- HPI Details Patient Name: [name]. Date of Service: 07/07/2019 8:00 AM Medical Record Number: [id number] Patient Account Number: [id number] Date of Birth/Sex: [birth year] (69 y.o. F) Treating RN: Curtis Sites Primary Care Provider: Joycelyn Man Other Clinician: Referring Provider: Joycelyn Man Treating Provider/Extender: Linwood Dibbles, [name] Weeks in Treatment: 7 History of Present Illness HPI Description: 05/19/2019 upon evaluation today patient presents for initial inspection here in our clinic concerning an injury that she has to the right lateral lower extremity. She does have associated cellulitis as well. Unfortunately she is continuing to have some discomfort at this point. She has been seen and prescribed doxycycline which she is currently taking that is excellent news. With that being said she fortunately does not appear to have any significant edema of the lower extremities at this point other than right around the wound area I do not think she has venous stasis which should be good for her as far as healing is concerned she is less likely to have complications. We still may want to use something such as  Tubigrip in order to help with just some of the local edema at this time. The patient is in agreement with that plan. I am also can have to likely perform some debridement to clear away some of the necrotic tissue currently as far as the wound surface is concerned. The patient has a history of hypertension but otherwise has no major medical problems. 05/25/2019 upon evaluation today patient appears to be doing roughly the same with regard to her wound. Unfortunately the skin flap that I was hoping would survive does not appear to have. This is going to require debridement in order to clear this away so that the wound can heal appropriately. That was discussed with the patient today and she understands and was completely expecting that based on how it looked as well. I would kind of forewarned her last week. 5/13; patient with a traumatic wound on the right lateral calf area. [Traumatized on [employer] door]. She has been using silver alginate. This was apparently complicated by cellulitis initially. Not currently on antibiotics. 5/20; traumatic wound to the right lateral calf. I changed her to [facility] after debridement last week. Wound is measuring about half a centimeter smaller in length and width. Surface looks healthy under illumination. She is changing the dressing every second 06/16/2019 upon evaluation today patient actually appears to be doing quite well with regard to the wound on her leg. The Hydrofera Blue has done excellent for her and very pleased in this regard. Fortunately there is no signs of active infection at this moment which is also a good sign. Overall I think she is measuring well and headed in the right direction. 06/23/2019 upon evaluation today patient actually appears to be doing quite well with regard to her wounds. She has been tolerating the dressing changes without complication and fortunately there is no evidence of active infection at  this time. No fevers,  chills, nausea, vomiting, or diarrhea. 06/30/2019 upon evaluation today patient appears to be doing well with regard to her wound. She had some minimal slough noted on the surface of the wound wound does appear to be somewhat hyper granular as well. That has been a little bit concerned about the fact [facility] may be the better option. I feel like there is too much moisture in the wound bed though it seems to get stuck around the edges of the wound. Maybe we can prevent this by utilizing zinc oxide around the edge to prevent the dressing from sticking. 07/07/2019 upon evaluation today patient actually appears to be doing well in regard to her wound bed. She has been tolerating the dressing changes without complication. Fortunately there is no evidence of active infection which is great news. Overall I am extremely pleased with where things stand today. Electronic Signature(s) Signed: 07/07/2019 8:48:06 AM By: Lenda KelpStone III, Sherrel Ploch PA-C Entered By: Lenda KelpStone III, Jayce Boyko on 07/07/2019 08:48:06 [name], Laura M. ([id number]) -------------------------------------------------------------------------------- Physical Exam Details Patient Name: [name], Laura M. Date of Service: 07/07/2019 8:00 AM Medical Record Number: [id number] Patient Account Number: [id number] Date of Birth/Sex: 11/03/1950 (69 y.o. F) Treating RN: Curtis Sitesorthy, Joanna Primary Care Provider: Joycelyn ManBurnette, Jennifer Other Clinician: Referring Provider: Joycelyn ManBurnette, Jennifer Treating Provider/Extender: Linwood DibblesSTONE III, Aiden Helzer Weeks in Treatment: 7 Constitutional Well-nourished and well-hydrated in no acute distress. Respiratory normal breathing without difficulty. Psychiatric this patient is able to make decisions and demonstrates good insight into disease process. Alert and Oriented x 3. pleasant and cooperative. Notes Patient's wound bed showed signs of good granulation epithelization visiting did prevent this from  sticking to the wound bed to significantly on the periwound and again in regard to the middle portion of the wound it is helping to keep any slough buildup from the surface. Overall this is measuring smaller I do believe she is making excellent progress. This is about half the size that it was last visit. Electronic Signature(s) Signed: 07/07/2019 8:48:34 AM By: Lenda KelpStone III, Jerrit Horen PA-C Entered By: Lenda KelpStone III, Thad Osoria on 07/07/2019 08:48:34 [name], Laura M. ([id number]) -------------------------------------------------------------------------------- Physician Orders Details Patient Name: [name], Laura M. Date of Service: 07/07/2019 8:00 AM Medical Record Number: [id number] Patient Account Number: [id number] Date of Birth/Sex: 10/13/1950 (69 y.o. F) Treating RN: Curtis Sitesorthy, Joanna Primary Care Provider: Joycelyn ManBurnette, Jennifer Other Clinician: Referring Provider: Joycelyn ManBurnette, Jennifer Treating Provider/Extender: Linwood DibblesSTONE III, Lizandro Spellman Weeks in Treatment: 7 Verbal / Phone Orders: No Diagnosis Coding ICD-10 Coding Code Description S81.801A Unspecified open wound, right lower leg, initial encounter L97.812 Non-pressure chronic ulcer of other part of right lower leg with fat layer exposed L03.115 Cellulitis of right lower limb I10 Essential (primary) hypertension Wound Cleansing Wound #1 Right,Lateral Lower Leg o Clean wound with Normal Saline. - in office o Dial antibacterial soap, wash wounds, rinse and pat dry prior to dressing wounds Skin Barriers/Peri-Wound Care Wound #1 Right,Lateral Lower Leg o Barrier cream - Desitin cream Primary Wound Dressing Wound #1 Right,Lateral Lower Leg o Hydrafera Blue Ready Transfer Secondary Dressing o ABD and Kerlix/Conform Dressing Change Frequency Wound #1 Right,Lateral Lower Leg o Change dressing every other day. Follow-up Appointments Wound #1 Right,Lateral Lower Leg o Return Appointment in 2 weeks. Edema Control Wound #1 Right,Lateral Lower Leg o Elevate  legs to the level of the heart and pump ankles as often as possible o Other: - TubiGrip F Electronic Signature(s) Signed: 07/07/2019 4:17:03 PM By: Curtis Sitesorthy, Joanna Signed:  07/07/2019 4:25:37 PM By: Lenda Kelp PA-C Entered By: Curtis Sites on 07/07/2019 08:31:03 [name] ([id number]) -------------------------------------------------------------------------------- Problem List Details Patient Name: YAREXI, PAWLICKI. Date of Service: 07/07/2019 8:00 AM Medical Record Number: [id number] Patient Account Number: [id number] Date of Birth/Sex: Jun 02, 1950 (69 y.o. F) Treating RN: Curtis Sites Primary Care Provider: Joycelyn Man Other Clinician: Referring Provider: Joycelyn Man Treating Provider/Extender: Linwood Dibbles, Marco Raper Weeks in Treatment: 7 Active Problems ICD-10 Encounter Code Description Active Date MDM Diagnosis S81.801A Unspecified open wound, right lower leg, initial encounter 05/18/2019 No Yes L97.812 Non-pressure chronic ulcer of other part of right lower leg with fat layer 05/18/2019 No Yes exposed L03.115 Cellulitis of right lower limb 05/18/2019 No Yes I10 Essential (primary) hypertension 05/18/2019 No Yes Inactive Problems Resolved Problems Electronic Signature(s) Signed: 07/07/2019 8:25:10 AM By: Lenda Kelp PA-C Entered By: Lenda Kelp on 07/07/2019 08:25:10 [name] ([id number]) -------------------------------------------------------------------------------- Progress Note Details Patient Name: [name]. Date of Service: 07/07/2019 8:00 AM Medical Record Number: [id number] Patient Account Number: [id number] Date of Birth/Sex: 17-Jan-1951 (69 y.o. F) Treating RN: Curtis Sites Primary Care Provider: Joycelyn Man Other Clinician: Referring Provider: Joycelyn Man Treating Provider/Extender: Linwood Dibbles, Silviano Neuser Weeks in Treatment: 7 Subjective Chief Complaint Information obtained from Patient Right LE Ulcer with Cellulitis History  of Present Illness (HPI) 05/19/2019 upon evaluation today patient presents for initial inspection here in our clinic concerning an injury that she has to the right lateral lower extremity. She does have associated cellulitis as well. Unfortunately she is continuing to have some discomfort at this point. She has been seen and prescribed doxycycline which she is currently taking that is excellent news. With that being said she fortunately does not appear to have any significant edema of the lower extremities at this point other than right around the wound area I do not think she has venous stasis which should be good for her as far as healing is concerned she is less likely to have complications. We still may want to use something such as Tubigrip in order to help with just some of the local edema at this time. The patient is in agreement with that plan. I am also can have to likely perform some debridement to clear away some of the necrotic tissue currently as far as the wound surface is concerned. The patient has a history of hypertension but otherwise has no major medical problems. 05/25/2019 upon evaluation today patient appears to be doing roughly the same with regard to her wound. Unfortunately the skin flap that I was hoping would survive does not appear to have. This is going to require debridement in order to clear this away so that the wound can heal appropriately. That was discussed with the patient today and she understands and was completely expecting that based on how it looked as well. I would kind of forewarned her last week. 5/13; patient with a traumatic wound on the right lateral calf area. [Traumatized on [employer] door]. She has been using silver alginate. This was apparently complicated by cellulitis initially. Not currently on antibiotics. 5/20; traumatic wound to the right lateral calf. I changed her to [facility] after debridement last week. Wound is measuring about half  a centimeter smaller in length and width. Surface looks healthy under illumination. She is changing the dressing every second 06/16/2019 upon evaluation today patient actually appears to be doing quite well with regard to the wound on her leg. The Hydrofera Blue has done excellent for  her and very pleased in this regard. Fortunately there is no signs of active infection at this moment which is also a good sign. Overall I think she is measuring well and headed in the right direction. 06/23/2019 upon evaluation today patient actually appears to be doing quite well with regard to her wounds. She has been tolerating the dressing changes without complication and fortunately there is no evidence of active infection at this time. No fevers, chills, nausea, vomiting, or diarrhea. 06/30/2019 upon evaluation today patient appears to be doing well with regard to her wound. She had some minimal slough noted on the surface of the wound wound does appear to be somewhat hyper granular as well. That has been a little bit concerned about the fact that the [facility] which we just switch her from last week may be the better option. I feel like there is too much moisture in the wound bed though it seems to get stuck around the edges of the wound. Maybe we can prevent this by utilizing zinc oxide around the edge to prevent the dressing from sticking. 07/07/2019 upon evaluation today patient actually appears to be doing well in regard to her wound bed. She has been tolerating the dressing changes without complication. Fortunately there is no evidence of active infection which is great news. Overall I am extremely pleased with where things stand today. Objective Constitutional Well-nourished and well-hydrated in no acute distress. Vitals Time Taken: 8:17 AM, Height: 66 in, Weight: 130 lbs, BMI: 21, Temperature: 97.8 F, Pulse: 83 bpm, Respiratory Rate: 16 breaths/min, Blood Pressure: 133/65 mmHg. Respiratory normal  breathing without difficulty. Psychiatric this patient is able to make decisions and demonstrates good insight into disease process. Alert and Oriented x 3. pleasant and cooperative. [name], [name] ([id number]) General Notes: Patient's wound bed showed signs of good granulation epithelization visiting did prevent this from sticking to the wound bed to significantly on the periwound and again in regard to the middle portion of the wound it is helping to keep any slough buildup from the surface. Overall this is measuring smaller I do believe she is making excellent progress. This is about half the size that it was last visit. Integumentary (Hair, Skin) Wound #1 status is Open. Original cause of wound was Trauma. The wound is located on the Right,Lateral Lower Leg. The wound measures 1.2cm length x 0.7cm width x 0.1cm depth; 0.66cm^2 area and 0.066cm^3 volume. There is Fat Layer (Subcutaneous Tissue) Exposed exposed. There is no tunneling or undermining noted. There is a medium amount of serous drainage noted. The wound margin is flat and intact. There is large (67-100%) pink granulation within the wound bed. There is a small (1-33%) amount of necrotic tissue within the wound bed including Adherent Slough. Assessment Active Problems ICD-10 Unspecified open wound, right lower leg, initial encounter Non-pressure chronic ulcer of other part of right lower leg with fat layer exposed Cellulitis of right lower limb Essential (primary) hypertension Plan Wound Cleansing: Wound #1 Right,Lateral Lower Leg: Clean wound with Normal Saline. - in office Dial antibacterial soap, wash wounds, rinse and pat dry prior to dressing wounds Skin Barriers/Peri-Wound Care: Wound #1 Right,Lateral Lower Leg: Barrier cream - Desitin cream Primary Wound Dressing: Wound #1 Right,Lateral Lower Leg: Hydrafera Blue Ready Transfer Secondary Dressing: ABD and Kerlix/Conform Dressing Change Frequency: Wound #1  Right,Lateral Lower Leg: Change dressing every other day. Follow-up Appointments: Wound #1 Right,Lateral Lower Leg: Return Appointment in 2 weeks. Edema Control: Wound #1 Right,Lateral Lower Leg: Elevate  legs to the level of the heart and pump ankles as often as possible Other: - TubiGrip F 1. I would recommend currently that we continue with the wound care measures as before specifically with regard to the [facility] and using zinc around the periwound the patient states that that sounds like a great plan. 2. She will also continue to change this every few days I think that is appropriate as well. Obviously it does not have to be a daily change. 3. I would recommend she continue with the Tubigrip which also seems to be doing great for her. We will see patient back for reevaluation in 2 weeks here in the clinic. If anything worsens or changes patient will contact our office for additional recommendations. Electronic Signature(s) Signed: 07/07/2019 8:49:03 AM By: Worthy Keeler PA-C Entered By: Worthy Keeler on 07/07/2019 08:49:03 [name] ([id number]) -------------------------------------------------------------------------------- SuperBill Details Patient Name: [name]. Date of Service: 07/07/2019 Medical Record Number: [id number] Patient Account Number: [id number] Date of Birth/Sex: 11-Jan-1951 (69 y.o. F) Treating RN: Montey Hora Primary Care Provider: Fenton Malling Other Clinician: Referring Provider: Fenton Malling Treating Provider/Extender: Melburn Hake, Alfreda Hammad Weeks in Treatment: 7 Diagnosis Coding ICD-10 Codes Code Description S81.801A Unspecified open wound, right lower leg, initial encounter L97.812 Non-pressure chronic ulcer of other part of right lower leg with fat layer exposed L03.115 Cellulitis of right lower limb I10 Essential (primary) hypertension Facility Procedures CPT4 Code: [id number] Description: 99213 - WOUND CARE VISIT-LEV 3 EST  PT Modifier: Quantity: 1 Physician Procedures CPT4 Code: [id number] Description: 78938 - WC PHYS LEVEL 3 - EST PT Modifier: Quantity: 1 CPT4 Code: Description: ICD-10 Diagnosis Description S81.801A Unspecified open wound, right lower leg, initial encounter B01.751 Non-pressure chronic ulcer of other part of right lower leg with fat la L03.115 Cellulitis of right lower limb I10 Essential (primary)  hypertension Modifier: yer exposed Quantity: Electronic Signature(s) Signed: 07/07/2019 8:49:27 AM By: Worthy Keeler PA-C Entered By: Worthy Keeler on 07/07/2019 08:49:26

## 2019-07-21 ENCOUNTER — Encounter: Payer: Medicare Other | Attending: Physician Assistant | Admitting: Physician Assistant

## 2019-07-21 ENCOUNTER — Other Ambulatory Visit: Payer: Self-pay

## 2019-07-21 DIAGNOSIS — I10 Essential (primary) hypertension: Secondary | ICD-10-CM | POA: Diagnosis not present

## 2019-07-21 DIAGNOSIS — L97812 Non-pressure chronic ulcer of other part of right lower leg with fat layer exposed: Secondary | ICD-10-CM | POA: Diagnosis not present

## 2019-07-21 DIAGNOSIS — L03115 Cellulitis of right lower limb: Secondary | ICD-10-CM | POA: Insufficient documentation

## 2019-07-21 NOTE — Progress Notes (Signed)
[name] ([id number]) Visit Report for 07/21/2019 Chief Complaint Document Details Patient Name: [name], [name]. Date of Service: 07/21/2019 8:00 AM Medical Record Number: [id number] Patient Account Number: [id number] Date of Birth/Sex: 1950-10-21 (69 y.o. F) Treating RN: Huel Coventry Primary Care Provider: Joycelyn Man Other Clinician: Referring Provider: Joycelyn Man Treating Provider/Extender: Linwood Dibbles, Sharnita Bogucki Weeks in Treatment: 9 Information Obtained from: Patient Chief Complaint Right LE Ulcer with Cellulitis Electronic Signature(s) Signed: 07/21/2019 1:43:29 PM By: Lenda Kelp PA-C Entered By: Lenda Kelp on 07/21/2019 08:15:21 [name] ([id number]) -------------------------------------------------------------------------------- HPI Details Patient Name: [name]. Date of Service: 07/21/2019 8:00 AM Medical Record Number: [id number] Patient Account Number: [id number] Date of Birth/Sex: 13-Sep-1950 (69 y.o. F) Treating RN: Huel Coventry Primary Care Provider: Joycelyn Man Other Clinician: Referring Provider: Joycelyn Man Treating Provider/Extender: Linwood Dibbles, Breeze Angell Weeks in Treatment: 9 History of Present Illness HPI Description: 05/19/2019 upon evaluation today patient presents for initial inspection here in our clinic concerning an injury that she has to the right lateral lower extremity. She does have associated cellulitis as well. Unfortunately she is continuing to have some discomfort at this point. She has been seen and prescribed doxycycline which she is currently taking that is excellent news. With that being said she fortunately does not appear to have any significant edema of the lower extremities at this point other than right around the wound area I do not think she has venous stasis which should be good for her as far as healing is concerned she is less likely to have complications. We still may want to use something such as Tubigrip in  order to help with just some of the local edema at this time. The patient is in agreement with that plan. I am also can have to likely perform some debridement to clear away some of the necrotic tissue currently as far as the wound surface is concerned. The patient has a history of hypertension but otherwise has no major medical problems. 05/25/2019 upon evaluation today patient appears to be doing roughly the same with regard to her wound. Unfortunately the skin flap that I was hoping would survive does not appear to have. This is going to require debridement in order to clear this away so that the wound can heal appropriately. That was discussed with the patient today and she understands and was completely expecting that based on how it looked as well. I would kind of forewarned her last week. 5/13; patient with a traumatic wound on the right lateral calf area. [Traumatized on [employer] door]. She has been using silver alginate. This was apparently complicated by cellulitis initially. Not currently on antibiotics. 5/20; traumatic wound to the right lateral calf. I changed her to [facility] after debridement last week. Wound is measuring about half a centimeter smaller in length and width. Surface looks healthy under illumination. She is changing the dressing every second 06/16/2019 upon evaluation today patient actually appears to be doing quite well with regard to the wound on her leg. The Hydrofera Blue has done excellent for her and very pleased in this regard. Fortunately there is no signs of active infection at this moment which is also a good sign. Overall I think she is measuring well and headed in the right direction. 06/23/2019 upon evaluation today patient actually appears to be doing quite well with regard to her wounds. She has been tolerating the dressing changes without complication and fortunately there is no evidence of active infection at  this time. No fevers, chills, nausea,  vomiting, or diarrhea. 06/30/2019 upon evaluation today patient appears to be doing well with regard to her wound. She had some minimal slough noted on the surface of the wound wound does appear to be somewhat hyper granular as well. That has been a little bit concerned about the fact that the [facility] which we just switch her from last week may be the better option. I feel like there is too much moisture in the wound bed though it seems to get stuck around the edges of the wound. Maybe we can prevent this by utilizing zinc oxide around the edge to prevent the dressing from sticking. 07/07/2019 upon evaluation today patient actually appears to be doing well in regard to her wound bed. She has been tolerating the dressing changes without complication. Fortunately there is no evidence of active infection which is great news. Overall I am extremely pleased with where things stand today. 07/21/2019 upon evaluation today patient appears to be doing excellent in regard to her wound in fact this appears to be I think healed. If there is not up spot the silver may be a pinpoint area but I cannot really see any drainage at this point. For that reason I think that the patient is okay to go ahead and discontinue wound care services as of today. Electronic Signature(s) Signed: 07/21/2019 8:31:30 AM By: Lenda Kelp PA-C Entered By: Lenda Kelp on 07/21/2019 08:31:30 [name] ([id number]) -------------------------------------------------------------------------------- Physical Exam Details Patient Name: [name], [name]. Date of Service: 07/21/2019 8:00 AM Medical Record Number: [id number] Patient Account Number: [id number] Date of Birth/Sex: 01-01-1951 (69 y.o. F) Treating RN: Huel Coventry Primary Care Provider: Joycelyn Man Other Clinician: Referring Provider: Joycelyn Man Treating Provider/Extender: Linwood Dibbles, Lacreshia Bondarenko Weeks in Treatment: 9 Constitutional Well-nourished and well-hydrated  in no acute distress. Respiratory normal breathing without difficulty. Psychiatric this patient is able to make decisions and demonstrates good insight into disease process. Alert and Oriented x 3. pleasant and cooperative. Notes Upon inspection patient's wound actually showed signs of good epithelization and I really think this is sealed up I do not see any drainage on the Q-tip as I am checking this which is good news. With that being said it is brand-new skin I would recommend she keep a bandage over just a small Band-Aid in order to keep it protected but other than that I do not think she is going require any additional wound care services at this point. Electronic Signature(s) Signed: 07/21/2019 8:31:54 AM By: Lenda Kelp PA-C Entered By: Lenda Kelp on 07/21/2019 08:31:54 [name] ([id number]) -------------------------------------------------------------------------------- Physician Orders Details Patient Name: [name]. Date of Service: 07/21/2019 8:00 AM Medical Record Number: [id number] Patient Account Number: [id number] Date of Birth/Sex: 1950-05-05 (69 y.o. F) Treating RN: Huel Coventry Primary Care Provider: Joycelyn Man Other Clinician: Referring Provider: Joycelyn Man Treating Provider/Extender: Linwood Dibbles, Andrews Tener Weeks in Treatment: 9 Verbal / Phone Orders: No Diagnosis Coding ICD-10 Coding Code Description S81.801A Unspecified open wound, right lower leg, initial encounter L97.812 Non-pressure chronic ulcer of other part of right lower leg with fat layer exposed L03.115 Cellulitis of right lower limb I10 Essential (primary) hypertension Secondary Dressing o Other - Band-Aid for protection for 1 week Discharge From [facility] Services o Discharge from Wound Care Center Electronic Signature(s) Signed: 07/21/2019 8:39:46 AM By: Lenda Kelp PA-C Entered By: Lenda Kelp on 07/21/2019 08:39:46 [name], Laura M.  ([id number]) -------------------------------------------------------------------------------- Problem List Details  Patient Name: Laura LitesMAYS, Shellene M. Date of Service: 07/21/2019 8:00 AM Medical Record Number: [id number] Patient Account Number: [id number] Date of Birth/Sex: 07/15/1950 (69 y.o. F) Treating RN: Huel CoventryWoody, Kim Primary Care Provider: Joycelyn ManBurnette, Jennifer Other Clinician: Referring Provider: Joycelyn ManBurnette, Jennifer Treating Provider/Extender: Linwood DibblesSTONE III, Akeiba Axelson Weeks in Treatment: 9 Active Problems ICD-10 Encounter Code Description Active Date MDM Diagnosis S81.801A Unspecified open wound, right lower leg, initial encounter 05/18/2019 No Yes L97.812 Non-pressure chronic ulcer of other part of right lower leg with fat layer 05/18/2019 No Yes exposed L03.115 Cellulitis of right lower limb 05/18/2019 No Yes I10 Essential (primary) hypertension 05/18/2019 No Yes Inactive Problems Resolved Problems Electronic Signature(s) Signed: 07/21/2019 1:43:29 PM By: Lenda KelpStone III, Kellen Dutch PA-C Entered By: Lenda KelpStone III, Cythina Mickelsen on 07/21/2019 08:15:01 [name], Laura M. ([id number]) -------------------------------------------------------------------------------- Progress Note Details Patient Name: Laura LitesMAYS, Jonte M. Date of Service: 07/21/2019 8:00 AM Medical Record Number: [id number] Patient Account Number: [id number] Date of Birth/Sex: 10/05/1950 (69 y.o. F) Treating RN: Huel CoventryWoody, Kim Primary Care Provider: Joycelyn ManBurnette, Jennifer Other Clinician: Referring Provider: Joycelyn ManBurnette, Jennifer Treating Provider/Extender: Linwood DibblesSTONE III, Jaquesha Boroff Weeks in Treatment: 9 Subjective Chief Complaint Information obtained from Patient Right LE Ulcer with Cellulitis History of Present Illness (HPI) 05/19/2019 upon evaluation today patient presents for initial inspection here in our clinic concerning an injury that she has to the right lateral lower extremity. She does have associated cellulitis as well. Unfortunately she is continuing to have some discomfort at this  point. She has been seen and prescribed doxycycline which she is currently taking that is excellent news. With that being said she fortunately does not appear to have any significant edema of the lower extremities at this point other than right around the wound area I do not think she has venous stasis which should be good for her as far as healing is concerned she is less likely to have complications. We still may want to use something such as Tubigrip in order to help with just some of the local edema at this time. The patient is in agreement with that plan. I am also can have to likely perform some debridement to clear away some of the necrotic tissue currently as far as the wound surface is concerned. The patient has a history of hypertension but otherwise has no major medical problems. 05/25/2019 upon evaluation today patient appears to be doing roughly the same with regard to her wound. Unfortunately the skin flap that I was hoping would survive does not appear to have. This is going to require debridement in order to clear this away so that the wound can heal appropriately. That was discussed with the patient today and she understands and was completely expecting that based on how it looked as well. I would kind of forewarned her last week. 5/13; patient with a traumatic wound on the right lateral calf area. [Traumatized on [employer] door]. She has been using silver alginate. This was apparently complicated by cellulitis initially. Not currently on antibiotics. 5/20; traumatic wound to the right lateral calf. [facility]. Wound is measuring about half a centimeter smaller in length and width. Surface looks healthy under illumination. She is changing the dressing every second 06/16/2019 upon evaluation today patient actually appears to be doing quite well with regard to the wound on her leg. The Hydrofera Blue has done excellent for her and very  pleased in this regard. Fortunately there is no signs of active infection at this moment which is also a good  sign. Overall I think she is measuring well and headed in the right direction. 06/23/2019 upon evaluation today patient actually appears to be doing quite well with regard to her wounds. She has been tolerating the dressing changes without complication and fortunately there is no evidence of active infection at this time. No fevers, chills, nausea, vomiting, or diarrhea. 06/30/2019 upon evaluation today patient appears to be doing well with regard to her wound. She had some minimal slough noted on the surface of the wound wound does appear to be somewhat hyper granular as well. That has been a little bit concerned about the fact that the [facility] which we just switch her from last week may be the better option. I feel like there is too much moisture in the wound bed though it seems to get stuck around the edges of the wound. Maybe we can prevent this by utilizing zinc oxide around the edge to prevent the dressing from sticking. 07/07/2019 upon evaluation today patient actually appears to be doing well in regard to her wound bed. She has been tolerating the dressing changes without complication. Fortunately there is no evidence of active infection which is great news. Overall I am extremely pleased with where things stand today. 07/21/2019 upon evaluation today patient appears to be doing excellent in regard to her wound in fact this appears to be I think healed. If there is not up spot the silver may be a pinpoint area but I cannot really see any drainage at this point. For that reason I think that the patient is okay to go ahead and discontinue wound care services as of today. Objective Constitutional Well-nourished and well-hydrated in no acute distress. Vitals Time Taken: 8:06 AM, Height: 66 in, Weight: 130 lbs, BMI: 21, Temperature: 98.0 F, Pulse: 91 bpm, Respiratory Rate: 16  breaths/min, Blood Pressure: 134/74 mmHg. Respiratory normal breathing without difficulty. [name], [name] ([id number]) Psychiatric this patient is able to make decisions and demonstrates good insight into disease process. Alert and Oriented x 3. pleasant and cooperative. General Notes: Upon inspection patient's wound actually showed signs of good epithelization and I really think this is sealed up I do not see any drainage on the Q-tip as I am checking this which is good news. With that being said it is brand-new skin I would recommend she keep a bandage over just a small Band-Aid in order to keep it protected but other than that I do not think she is going require any additional wound care services at this point. Integumentary (Hair, Skin) Wound #1 status is Healed - Epithelialized. Original cause of wound was Trauma. The wound is located on the Right,Lateral Lower Leg. The wound measures 0cm length x 0cm width x 0cm depth; 0cm^2 area and 0cm^3 volume. There is Fat Layer (Subcutaneous Tissue) Exposed exposed. There is a medium amount of serous drainage noted. The wound margin is flat and intact. There is large (67-100%) pink granulation within the wound bed. There is a small (1-33%) amount of necrotic tissue within the wound bed including Adherent Slough. Assessment Active Problems ICD-10 Unspecified open wound, right lower leg, initial encounter Non-pressure chronic ulcer of other part of right lower leg with fat layer exposed Cellulitis of right lower limb Essential (primary) hypertension Plan Secondary Dressing: Other - Band-Aid for protection for 1 week Discharge From [facility] Services: Discharge from Wound Care Center 1. I would recommend that we go ahead and discontinue wound care services as the patient appears to be completely healed  and is doing excellent. 2. I am also can recommend at this time that we have her just use a protective bandage over it for the next week and if she is  not seeing any drainage on the bandage then she can discontinue even doing that following. We will see the patient back for follow-up visit as needed. Electronic Signature(s) Signed: 07/21/2019 8:41:26 AM By: Lenda Kelp PA-C Previous Signature: 07/21/2019 8:32:43 AM Version By: Lenda Kelp PA-C Entered By: Lenda Kelp on 07/21/2019 08:41:26 [name], [name] ([id number]) -------------------------------------------------------------------------------- SuperBill Details Patient Name: [name]. Date of Service: 07/21/2019 Medical Record Number: [id number] Patient Account Number: [id number] Date of Birth/Sex: 1950/09/12 (69 y.o. F) Treating RN: Huel Coventry Primary Care Provider: Joycelyn Man Other Clinician: Referring Provider: Joycelyn Man Treating Provider/Extender: Linwood Dibbles, Aron Needles Weeks in Treatment: 9 Diagnosis Coding ICD-10 Codes Code Description S81.801A Unspecified open wound, right lower leg, initial encounter L97.812 Non-pressure chronic ulcer of other part of right lower leg with fat layer exposed L03.115 Cellulitis of right lower limb I10 Essential (primary) hypertension Physician Procedures CPT4 Code: [id number] Description: 99213 - WC PHYS LEVEL 3 - EST PT Modifier: Quantity: 1 CPT4 Code: Description: ICD-10 Diagnosis Description S81.801A Unspecified open wound, right lower leg, initial encounter L97.812 Non-pressure chronic ulcer of other part of right lower leg with fat la L03.115 Cellulitis of right lower limb I10 Essential (primary)  hypertension Modifier: yer exposed Quantity: Electronic Signature(s) Signed: 07/21/2019 8:33:01 AM By: Lenda Kelp PA-C Entered By: Lenda Kelp on 07/21/2019 08:33:01

## 2019-07-21 NOTE — Progress Notes (Signed)
[name], [name] ([id number]) Visit Report for 07/21/2019 Arrival Information Details Patient Name: [name], Moss. Date of Service: 07/21/2019 8:00 AM Medical Record Number: [id number] Patient Account Number: [id number] Date of Birth/Sex: May 06, 1950 (68 y.o. F) Treating RN: Rodell Perna Primary Care Bhavesh Vazquez: Joycelyn Man Other Clinician: Referring Vickie Melnik: Joycelyn Man Treating Doron Shake/Extender: Linwood Dibbles, HOYT Weeks in Treatment: 9 Visit Information History Since Last Visit Added or deleted any medications: No Patient Arrived: Ambulatory Any new allergies or adverse reactions: No Arrival Time: 08:06 Had a fall or experienced change in No Accompanied By: self activities of daily living that may affect Transfer Assistance: None risk of falls: Patient Identification Verified: Yes Signs or symptoms of abuse/neglect since last visito No Hospitalized since last visit: No Has Dressing in Place as Prescribed: Yes Pain Present Now: No Electronic Signature(s) Signed: 07/21/2019 10:21:07 AM By: Rodell Perna Entered By: Rodell Perna on 07/21/2019 08:06:47 [name] ([id number]) -------------------------------------------------------------------------------- Clinic Level of Care Assessment Details Patient Name: [name]. Date of Service: 07/21/2019 8:00 AM Medical Record Number: [id number] Patient Account Number: [id number] Date of Birth/Sex: 05-26-1950 (68 y.o. F) Treating RN: Huel Coventry Primary Care Averly Ericson: Joycelyn Man Other Clinician: Referring Will Schier: Joycelyn Man Treating Marvell Tamer/Extender: Linwood Dibbles, HOYT Weeks in Treatment: 9 Clinic Level of Care Assessment Items TOOL 4 Quantity Score []  - Use when only an EandM is performed on FOLLOW-UP visit 0 ASSESSMENTS - Nursing Assessment / Reassessment X - Reassessment of Co-morbidities (includes updates in patient status) 1 10 X- 1 5 Reassessment of Adherence to Treatment Plan ASSESSMENTS - Wound and Skin  Assessment / Reassessment X - Simple Wound Assessment / Reassessment - one wound 1 5 []  - 0 Complex Wound Assessment / Reassessment - multiple wounds []  - 0 Dermatologic / Skin Assessment (not related to wound area) ASSESSMENTS - Focused Assessment []  - Circumferential Edema Measurements - multi extremities 0 []  - 0 Nutritional Assessment / Counseling / Intervention []  - 0 Lower Extremity Assessment (monofilament, tuning fork, pulses) []  - 0 Peripheral Arterial Disease Assessment (using hand held doppler) ASSESSMENTS - Ostomy and/or Continence Assessment and Care []  - Incontinence Assessment and Management 0 []  - 0 Ostomy Care Assessment and Management (repouching, etc.) PROCESS - Coordination of Care X - Simple Patient / Family Education for ongoing care 1 15 []  - 0 Complex (extensive) Patient / Family Education for ongoing care []  - 0 Staff obtains , Records, Test Results / Process Orders []  - 0 Staff telephones HHA, Nursing Homes / Clarify orders / etc []  - 0 Routine Transfer to another Facility (non-emergent condition) []  - 0 Routine Hospital Admission (non-emergent condition) []  - 0 New Admissions / / Ordering NPWT, Apligraf, etc. []  - 0 Emergency Hospital Admission (emergent condition) X- 1 10 Simple Discharge Coordination []  - 0 Complex (extensive) Discharge Coordination PROCESS - Special Needs []  - Pediatric / Minor Patient Management 0 []  - 0 Isolation Patient Management []  - 0 Hearing / Language / Visual special needs []  - 0 Assessment of Community assistance (transportation, D/C planning, etc.) []  - 0 Additional assistance / Altered mentation []  - 0 Support Surface(s) Assessment (bed, cushion, seat, etc.) INTERVENTIONS - Wound Cleansing / Measurement [name], Laura M. ( ) X- 1 5 Simple Wound Cleansing - one wound []  - 0 Complex Wound Cleansing - multiple wounds X- 1 5 Wound Imaging (photographs - any number of  wounds) []  - 0 Wound Tracing (instead of photographs) X- 1 5 Simple Wound Measurement - one wound []  -  0 Complex Wound Measurement - multiple wounds INTERVENTIONS - Wound Dressings []  - Small Wound Dressing one or multiple wounds 0 []  - 0 Medium Wound Dressing one or multiple wounds []  - 0 Large Wound Dressing one or multiple wounds []  - 0 Application of Medications - topical []  - 0 Application of Medications - injection INTERVENTIONS - Miscellaneous []  - External ear exam 0 []  - 0 Specimen Collection (cultures, biopsies, blood, body fluids, etc.) []  - 0 Specimen(s) / Culture(s) sent or taken to Lab for analysis []  - 0 Patient Transfer (multiple staff / / Similar devices) []  - 0 Simple Staple / Suture removal (25 or less) []  - 0 Complex Staple / Suture removal (26 or more) []  - 0 Hypo / Hyperglycemic Management (close monitor of Blood Glucose) []  - 0 Ankle / Brachial Index (ABI) - do not check if billed separately X- 1 5 Vital Signs Has the patient been seen at the hospital within the last three years: Yes Total Score: 65 Level Of Care: New/Established - Level 2 Electronic Signature(s) Signed: 07/21/2019 5:03:03 PM By: , BSN, RN, CWS, Kim RN, BSN Entered By: , BSN, RN, CWS, Kim on 07/21/2019 16:59:29 ( ) -------------------------------------------------------------------------------- Lower Extremity Assessment Details Patient Name: [name]. Date of Service: 07/21/2019 8:00 AM Medical Record Number: Nurse, adult Patient Account Number: Date of Birth/Sex: May 17, 1950 (68 y.o. F) Treating RN: Primary Care Shion Bluestein: 09/21/2019 Other Clinician: Referring Sowmya Partridge: Elliot Gurney Treating Shriley Joffe/Extender: Elliot Gurney, HOYT Weeks in Treatment: 9 Edema Assessment Assessed: [Left: No] [Right: No] Edema: [Left: N] [Right: o] Vascular Assessment Pulses: Dorsalis Pedis Palpable:  [Right:Yes] Electronic Signature(s) Signed: 07/21/2019 10:21:07 AM By: [name] Entered By: [id number] on 07/21/2019 08:09:21 09/21/2019 ([id number]) -------------------------------------------------------------------------------- Multi-Disciplinary Care Plan Details Patient Name: VIRA, CHAPLIN. Date of Service: 07/21/2019 8:00 AM Medical Record Number: [id number] Patient Account Number: Rodell Perna Date of Birth/Sex: Dec 03, 1950 (68 y.o. F) Treating RN: Linwood Dibbles Primary Care Audreana Hancox: 09/21/2019 Other Clinician: Referring Genecis Veley: Rodell Perna Treating Tasmia Blumer/Extender: Rodell Perna, HOYT Weeks in Treatment: 9 Active Inactive Electronic Signature(s) Signed: 07/21/2019 5:03:03 PM By: [name], BSN, RN, CWS, Kim RN, BSN Entered By: [id number], BSN, RN, CWS, Kim on 07/21/2019 08:29:56 09/21/2019 ([id number]) -------------------------------------------------------------------------------- Pain Assessment Details Patient Name: ANNALIAH, RIVENBARK. Date of Service: 07/21/2019 8:00 AM Medical Record Number: [id number] Patient Account Number: Huel Coventry Date of Birth/Sex: 09-09-1950 (68 y.o. F) Treating RN: Linwood Dibbles Primary Care Sohil Timko: 09/21/2019 Other Clinician: Referring Kohler Pellerito: Elliot Gurney Treating Benyamin Jeff/Extender: Elliot Gurney, HOYT Weeks in Treatment: 9 Active Problems Location of Pain Severity and Description of Pain Patient Has Paino No Site Locations Pain Management and Medication Current Pain Management: Electronic Signature(s) Signed: 07/21/2019 10:21:07 AM By: [name] Entered By: [id number] on 07/21/2019 08:07:16 Mcglown, Payzlee M. (09/21/2019) -------------------------------------------------------------------------------- Wound Assessment Details Patient Name: [id number]. Date of Service: 07/21/2019 8:00 AM Medical Record Number: 14/07/1950 Patient Account Number: [id number] Date of Birth/Sex: Jul 01, 1950 (68 y.o. F) Treating RN: Joycelyn Man Primary Care Lavaughn Haberle: Linwood Dibbles Other Clinician: Referring Javed Cotto: 09/21/2019 Treating Abrial Arrighi/Extender: Rodell Perna, HOYT Weeks in Treatment: 9 Wound Status Wound Number: 1 Primary Etiology: Trauma, Other Wound Location: Right, Lateral Lower Leg Wound Status: Healed - Epithelialized Wounding Event: Trauma Comorbid History: Hypertension Date Acquired: 05/10/2019 Weeks Of Treatment: 9 Clustered Wound: No Photos Wound Measurements Length: (cm) 0 Width: (cm) 0 Depth: (cm) 0 Area: (cm) 0 Volume: (cm) 0 % Reduction in Area: 100% %  Reduction in Volume: 100% Epithelialization: Large (67-100%) Wound Description Classification: Full Thickness Without Exposed Support Structu Wound Margin: Flat and Intact Exudate Amount: Medium Exudate Type: Serous Exudate Color: amber res Foul Odor After Cleansing: No Slough/Fibrino Yes Wound Bed Granulation Amount: Large (67-100%) Exposed Structure Granulation Quality: Pink Fascia Exposed: No Necrotic Amount: Small (1-33%) Fat Layer (Subcutaneous Tissue) Exposed: Yes Necrotic Quality: Adherent Slough Tendon Exposed: No Muscle Exposed: No Joint Exposed: No Bone Exposed: No Electronic Signature(s) Signed: 07/21/2019 5:03:03 PM By: Elliot Gurney, BSN, RN, CWS, Kim RN, BSN Entered By: Elliot Gurney, BSN, RN, CWS, Kim on 07/21/2019 08:29:43 [name] ([id number]) -------------------------------------------------------------------------------- Vitals Details Patient Name: [name]. Date of Service: 07/21/2019 8:00 AM Medical Record Number: [id number] Patient Account Number: [id number] Date of Birth/Sex: [birth year] (68 y.o. F) Treating RN: Rodell Perna Primary Care Domnick Chervenak: Joycelyn Man Other Clinician: Referring Wilferd Ritson: Joycelyn Man Treating Zhuri Krass/Extender: Linwood Dibbles, HOYT Weeks in Treatment: 9 Vital Signs Time Taken: 08:06 Temperature (F): 98.0 Height (in): 66 Pulse (bpm): 91 Weight (lbs):  130 Respiratory Rate (breaths/min): 16 Body Mass Index (BMI): 21 Blood Pressure (mmHg): 134/74 Reference Range: 80 - 120 mg / dl Electronic Signature(s) Signed: 07/21/2019 10:21:07 AM By: Rodell Perna Entered By: Rodell Perna on 07/21/2019 08:06:58

## 2019-08-14 ENCOUNTER — Ambulatory Visit (INDEPENDENT_AMBULATORY_CARE_PROVIDER_SITE_OTHER): Payer: Medicare Other | Admitting: Physician Assistant

## 2019-08-14 ENCOUNTER — Encounter: Payer: Self-pay | Admitting: Physician Assistant

## 2019-08-14 ENCOUNTER — Other Ambulatory Visit: Payer: Self-pay

## 2019-08-14 VITALS — BP 135/76 | HR 80 | Temp 97.0°F | Resp 16 | Ht 66.0 in | Wt 131.6 lb

## 2019-08-14 DIAGNOSIS — I10 Essential (primary) hypertension: Secondary | ICD-10-CM | POA: Diagnosis not present

## 2019-08-14 DIAGNOSIS — M810 Age-related osteoporosis without current pathological fracture: Secondary | ICD-10-CM

## 2019-08-14 DIAGNOSIS — H8091 Unspecified otosclerosis, right ear: Secondary | ICD-10-CM

## 2019-08-14 DIAGNOSIS — E7800 Pure hypercholesterolemia, unspecified: Secondary | ICD-10-CM | POA: Diagnosis not present

## 2019-08-14 DIAGNOSIS — Z0000 Encounter for general adult medical examination without abnormal findings: Secondary | ICD-10-CM | POA: Diagnosis not present

## 2019-08-14 NOTE — Patient Instructions (Signed)
Health Maintenance After Age 68 After age 68, you are at a higher risk for certain long-term diseases and infections as well as injuries from falls. Falls are a major cause of broken bones and head injuries in people who are older than age 68. Getting regular preventive care can help to keep you healthy and well. Preventive care includes getting regular testing and making lifestyle changes as recommended by your health care provider. Talk with your health care provider about:  Which screenings and tests you should have. A screening is a test that checks for a disease when you have no symptoms.  A diet and exercise plan that is right for you. What should I know about screenings and tests to prevent falls? Screening and testing are the best ways to find a health problem early. Early diagnosis and treatment give you the best chance of managing medical conditions that are common after age 68. Certain conditions and lifestyle choices may make you more likely to have a fall. Your health care provider may recommend:  Regular vision checks. Poor vision and conditions such as cataracts can make you more likely to have a fall. If you wear glasses, make sure to get your prescription updated if your vision changes.  Medicine review. Work with your health care provider to regularly review all of the medicines you are taking, including over-the-counter medicines. Ask your health care provider about any side effects that may make you more likely to have a fall. Tell your health care provider if any medicines that you take make you feel dizzy or sleepy.  Osteoporosis screening. Osteoporosis is a condition that causes the bones to get weaker. This can make the bones weak and cause them to break more easily.  Blood pressure screening. Blood pressure changes and medicines to control blood pressure can make you feel dizzy.  Strength and balance checks. Your health care provider may recommend certain tests to check your  strength and balance while standing, walking, or changing positions.  Foot health exam. Foot pain and numbness, as well as not wearing proper footwear, can make you more likely to have a fall.  Depression screening. You may be more likely to have a fall if you have a fear of falling, feel emotionally low, or feel unable to do activities that you used to do.  Alcohol use screening. Using too much alcohol can affect your balance and may make you more likely to have a fall. What actions can I take to lower my risk of falls? General instructions  Talk with your health care provider about your risks for falling. Tell your health care provider if: ? You fall. Be sure to tell your health care provider about all falls, even ones that seem minor. ? You feel dizzy, sleepy, or off-balance.  Take over-the-counter and prescription medicines only as told by your health care provider. These include any supplements.  Eat a healthy diet and maintain a healthy weight. A healthy diet includes low-fat dairy products, low-fat (lean) meats, and fiber from whole grains, beans, and lots of fruits and vegetables. Home safety  Remove any tripping hazards, such as rugs, cords, and clutter.  Install safety equipment such as grab bars in bathrooms and safety rails on stairs.  Keep rooms and walkways well-lit. Activity   Follow a regular exercise program to stay fit. This will help you maintain your balance. Ask your health care provider what types of exercise are appropriate for you.  If you need a cane or   walker, use it as recommended by your health care provider.  Wear supportive shoes that have nonskid soles. Lifestyle  Do not drink alcohol if your health care provider tells you not to drink.  If you drink alcohol, limit how much you have: ? 0-1 drink a day for women. ? 0-2 drinks a day for men.  Be aware of how much alcohol is in your drink. In the [place] one drink equals one typical bottle of beer (12  oz), one-half glass of wine (5 oz), or one shot of hard liquor (1 oz).  Do not use any products that contain nicotine or tobacco, such as cigarettes and e-cigarettes. If you need help quitting, ask your health care provider. Summary  Having a healthy lifestyle and getting preventive care can help to protect your health and wellness after age 68.  Screening and testing are the best way to find a health problem early and help you avoid having a fall. Early diagnosis and treatment give you the best chance for managing medical conditions that are more common for people who are older than age 68.  Falls are a major cause of broken bones and head injuries in people who are older than age 68. Take precautions to prevent a fall at home.  Work with your health care provider to learn what changes you can make to improve your health and wellness and to prevent falls. This information is not intended to replace advice given to you by your health care provider. Make sure you discuss any questions you have with your health care provider. Document Revised: 04/28/2018 Document Reviewed: 11/18/2016 [employer] Patient Education  2020 [employer]  

## 2019-08-14 NOTE — Progress Notes (Signed)
Annual Wellness Visit     Patient: [name], Female    DOB: 07/21/1950, 69 y.o.   MRN: [id number] Visit Date: 08/14/2019  Today's Provider: Margaretann LovelessJennifer M Dejah Droessler, PA-C   No chief complaint on file.  Subjective    [name] is a 69 y.o. female who presents today for her Annual Wellness Visit. She reports consuming a general diet. Home exercise routine includes walks 30--60 minutes daily. She generally feels well. She reports sleeping well. She does not have additional problems to discuss today.   HPI 02/23/19-Normal mammogram. Repeat screening in one year. 06/08/2017-Colonoscopy 08/24/2017-Bone Density  New otosclerosis in the right ear. Seeing Dr. Jenne CampusMcQueen. Having annual hearing test.   Patient Active Problem List   Diagnosis Date Noted  . Hyperlipidemia   . Age-related osteoporosis without current pathological fracture 06/03/2017  . History of right cataract surgery 09/29/2016  . Chest pain on exertion 05/28/2016  . Right retinal detachment 05/01/2016  . Varicose veins of both lower extremities with inflammation 11/21/2015  . Chronic venous insufficiency 11/21/2015  . Pain in limb 11/21/2015  . GERD (gastroesophageal reflux disease) 11/23/2014  . Allergic rhinitis 07/17/2014  . History of tobacco use 02/26/2006  . Interstitial cystitis 02/22/2006  . Abdominal hernia with gangrene and obstruction 02/22/2006  . Hypertension 04/22/1999   Past Medical History:  Diagnosis Date  . Complication of anesthesia   . GERD (gastroesophageal reflux disease)   . Hyperlipidemia   . Hypertension   . IC (interstitial cystitis)   . PONV (postoperative nausea and vomiting)    NO PROBLEM     4/18 WITH RETINAL SURGERY       Medications: Outpatient Medications Prior to Visit  Medication Sig  . amitriptyline (ELAVIL) 50 MG tablet Take 1 tablet (50 mg total) by mouth at bedtime.  . Calcium Carbonate-Vitamin D 600-200 MG-UNIT CAPS Take 2 tablets by mouth daily.  . Calcium  Glycerophosphate (PRELIEF PO) Take 2 tablets by mouth daily. As needed   . Cholecalciferol 125 MCG (5000 UT) TABS Take by mouth.  . fluticasone (FLONASE) 50 MCG/ACT nasal spray Place 2 sprays into both nostrils daily.  Marland Kitchen. MYRBETRIQ 50 MG TB24 tablet   . pantoprazole (PROTONIX) 40 MG tablet Take 1 tablet (40 mg total) by mouth daily.  Marland Kitchen. triamterene-hydrochlorothiazide (MAXZIDE-25) 37.5-25 MG tablet Take 1 tablet by mouth daily.  . vitamin C (ASCORBIC ACID) 500 MG tablet Take 500 mg by mouth daily.  Marland Kitchen. aspirin 325 MG EC tablet Take by mouth.  . clobetasol ointment (TEMOVATE) 0.05 % Apply to affected area every night for 4 weeks, then every other day for 4 weeks and then twice a week for4 weeks or until resolution.  Marland Kitchen. doxycycline (VIBRAMYCIN) 100 MG capsule Take 1 capsule (100 mg total) by mouth 2 (two) times daily.  . ondansetron (ZOFRAN) 4 MG tablet Take 1 tablet (4 mg total) by mouth every 6 (six) hours as needed for nausea or vomiting.   No facility-administered medications prior to visit.    Allergies  Allergen Reactions  . Doxycycline Nausea Only  . Penicillins Rash  . Pneumococcal 13-Val Conj Vacc Rash    Localized swelling and erythema of upper extremity; patient refuses pneumococcal 23 and wants documented she will not take due to this reaction Localized swelling and erythema of upper extremity; patient refuses pneumococcal 23 and wants documented she will not take due to this reaction  . Sulfa Antibiotics Rash    Patient Care Team: Margaretann LovelessBurnette, Brodin Gelpi M,  PA-C as PCP - General (Family Medicine)  Review of Systems  Constitutional: Negative.   HENT: Negative.   Eyes: Negative.   Respiratory: Negative.   Cardiovascular: Negative.   Gastrointestinal: Negative.   Endocrine: Negative.   Genitourinary: Positive for frequency.  Musculoskeletal: Negative.   Allergic/Immunologic: Negative.   Neurological: Negative.   Hematological: Negative.   Psychiatric/Behavioral: Negative.      Last CBC Lab Results  Component Value Date   WBC 7.7 08/10/2018   HGB 13.8 08/10/2018   HCT 39.4 08/10/2018   MCV 89 08/10/2018   MCH 31.0 08/10/2018   RDW 12.0 08/10/2018   PLT 244 08/10/2018   Last metabolic panel Lab Results  Component Value Date   GLUCOSE 81 08/10/2018   NA 135 08/10/2018   K 4.3 08/10/2018   CL 94 (L) 08/10/2018   CO2 24 08/10/2018   BUN 15 08/10/2018   CREATININE 0.71 08/10/2018   GFRNONAA 89 08/10/2018   GFRAA 103 08/10/2018   CALCIUM 9.5 08/10/2018   PROT 7.3 08/10/2018   ALBUMIN 4.6 08/10/2018   LABGLOB 2.7 08/10/2018   AGRATIO 1.7 08/10/2018   BILITOT 0.3 08/10/2018   ALKPHOS 71 08/10/2018   AST 18 08/10/2018   ALT 15 08/10/2018      Objective    Vitals: BP (!) 135/76 (BP Location: Left Arm, Patient Position: Sitting, Cuff Size: Normal)   Pulse 80   Temp (!) 97 F (36.1 C) (Temporal)   Resp 16   Ht 5\' 6"  (1.676 m)   Wt 131 lb 9.6 oz (59.7 kg)   BMI 21.24 kg/m  BP Readings from Last 3 Encounters:  08/14/19 (!) 135/76  05/15/19 (!) 164/94  05/08/19 138/80   Wt Readings from Last 3 Encounters:  08/14/19 131 lb 9.6 oz (59.7 kg)  05/08/19 133 lb 3.2 oz (60.4 kg)  12/19/18 132 lb (59.9 kg)      Physical Exam Vitals reviewed.  Constitutional:      General: She is not in acute distress.    Appearance: Normal appearance. She is well-developed and normal weight. She is not ill-appearing or diaphoretic.  HENT:     Head: Normocephalic and atraumatic.     Right Ear: Tympanic membrane, ear canal and external ear normal.     Left Ear: Tympanic membrane, ear canal and external ear normal.  Eyes:     General: No scleral icterus.       Right eye: No discharge.        Left eye: No discharge.     Extraocular Movements: Extraocular movements intact.     Conjunctiva/sclera: Conjunctivae normal.     Pupils: Pupils are equal, round, and reactive to light.  Neck:     Thyroid: No thyromegaly.     Vascular: No carotid bruit or JVD.      Trachea: No tracheal deviation.  Cardiovascular:     Rate and Rhythm: Normal rate and regular rhythm.     Pulses: Normal pulses.     Heart sounds: Normal heart sounds. No murmur heard.  No friction rub. No gallop.   Pulmonary:     Effort: Pulmonary effort is normal. No respiratory distress.     Breath sounds: Normal breath sounds. No wheezing or rales.  Chest:     Chest wall: No tenderness.  Abdominal:     General: Abdomen is flat. Bowel sounds are normal. There is no distension.     Palpations: Abdomen is soft. There is no mass.  Tenderness: There is no abdominal tenderness. There is no guarding or rebound.  Musculoskeletal:        General: No tenderness. Normal range of motion.     Cervical back: Normal range of motion and neck supple.     Right lower leg: No edema.     Left lower leg: No edema.  Lymphadenopathy:     Cervical: No cervical adenopathy.  Skin:    General: Skin is warm and dry.     Capillary Refill: Capillary refill takes less than 2 seconds.     Findings: No rash.  Neurological:     General: No focal deficit present.     Mental Status: She is alert and oriented to person, place, and time. Mental status is at baseline.  Psychiatric:        Mood and Affect: Mood normal.        Behavior: Behavior normal.        Thought Content: Thought content normal.        Judgment: Judgment normal.     Most recent functional status assessment: In your present state of health, do you have any difficulty performing the following activities: 08/14/2019  Hearing? Y  Comment "slightly"  Vision? N  Difficulty concentrating or making decisions? N  Walking or climbing stairs? N  Dressing or bathing? N  Doing errands, shopping? N  Some recent data might be hidden   Most recent fall risk assessment: Fall Risk  08/14/2019  Falls in the past year? 0  Number falls in past yr: 0  Injury with Fall? 0  Risk for fall due to : No Fall Risks  Follow up Falls evaluation completed     Most recent depression screenings: PHQ 2/9 Scores 08/14/2019 08/10/2018  PHQ - 2 Score 0 0   Most recent cognitive screening: 6CIT Screen 06/03/2017  What Year? 0 points  What month? 0 points  What time? 0 points  Count back from 20 0 points  Months in reverse 0 points  Repeat phrase 0 points  Total Score 0   Most recent Audit-C alcohol use screening Alcohol Use Disorder Test (AUDIT) 08/14/2019  1. How often do you have a drink containing alcohol? 0  2. How many drinks containing alcohol do you have on a typical day when you are drinking? 0  3. How often do you have six or more drinks on one occasion? 0  AUDIT-C Score 0  Alcohol Brief Interventions/Follow-up AUDIT Score <7 follow-up not indicated   A score of 3 or more in women, and 4 or more in men indicates increased risk for alcohol abuse, EXCEPT if all of the points are from question 1   No results found for any visits on 08/14/19.  Assessment & Plan     Annual wellness visit done today including the all of the following: Reviewed patient's Family Medical History Reviewed and updated list of patient's medical providers Assessment of cognitive impairment was done Assessed patient's functional ability Established a written schedule for health screening services Health Risk Assessent Completed and Reviewed  Exercise Activities and Dietary recommendations Goals    . Exercise 150 minutes per week (moderate activity)       Immunization History  Administered Date(s) Administered  . H1N1 12/22/2007  . Influenza Split 11/19/2005, 10/07/2009, 10/10/2010  . Influenza,inj,Quad PF,6+ Mos 10/20/2014  . Influenza-Unspecified 10/15/2015, 10/18/2017  . Pneumococcal Conjugate-13 06/03/2017  . Tdap 10/10/2010  . Zoster 02/16/2015    Health Maintenance  Topic Date Due  .  COVID-19 Vaccine (1) Never done  . PNA vac Low Risk Adult (2 of 2 - PPSV23) 06/04/2018  . INFLUENZA VACCINE  08/20/2019  . Fecal DNA (Cologuard)   01/27/2020  . TETANUS/TDAP  10/09/2020  . MAMMOGRAM  02/22/2021  . DEXA SCAN  Completed  . Hepatitis C Screening  Completed     Discussed health benefits of physical activity, and encouraged her to engage in regular exercise appropriate for her age and condition.    1. Medicare annual wellness visit, subsequent Normal physical exam today. Will check labs as below and f/u pending lab results. If labs are stable and WNL she will not need to have these rechecked for one year at her next annual physical exam. She is to call the office in the meantime if she has any acute issue, questions or concerns.  2. Essential hypertension Stable. Continue Maxzide 37.5-25mg . Will check labs as below and f/u pending results. - CBC with Differential/Platelet - Comprehensive metabolic panel - Lipid panel  3. Age-related osteoporosis without current pathological fracture Last BMD (2019) had T score -3.8. Due for repeat now, but will await and schedule with next mammogram in 02/2020. She is in agreement. Continue supplements and walking.  - CBC with Differential/Platelet - Comprehensive metabolic panel  4. Pure hypercholesterolemia Diet controlled. Will check labs as below and f/u pending results. - CBC with Differential/Platelet - Comprehensive metabolic panel - Lipid panel  5. Otosclerosis of right ear New. Followed by Dr. Jenne Campus. Currently having annual hearing exams.    No follow-ups on file.     Delmer Islam, PA-C, have reviewed all documentation for this visit. The documentation on 08/14/19 for the exam, diagnosis, procedures, and orders are all accurate and complete.   Reine Just  [facility] [phone] (phone) [phone] (fax)  [facility]

## 2019-08-15 ENCOUNTER — Encounter: Payer: Self-pay | Admitting: Physician Assistant

## 2019-08-15 ENCOUNTER — Telehealth: Payer: Self-pay

## 2019-08-15 LAB — COMPREHENSIVE METABOLIC PANEL
ALT: 18 IU/L (ref 0–32)
AST: 21 IU/L (ref 0–40)
Albumin/Globulin Ratio: 1.7 (ref 1.2–2.2)
Albumin: 4.5 g/dL (ref 3.8–4.8)
Alkaline Phosphatase: 73 IU/L (ref 48–121)
BUN/Creatinine Ratio: 22 (ref 12–28)
BUN: 16 mg/dL (ref 8–27)
Bilirubin Total: 0.2 mg/dL (ref 0.0–1.2)
CO2: 24 mmol/L (ref 20–29)
Calcium: 9.2 mg/dL (ref 8.7–10.3)
Chloride: 93 mmol/L — ABNORMAL LOW (ref 96–106)
Creatinine, Ser: 0.72 mg/dL (ref 0.57–1.00)
GFR calc Af Amer: 100 mL/min/{1.73_m2} (ref >59)
GFR calc non Af Amer: 87 mL/min/{1.73_m2} (ref >59)
Globulin, Total: 2.6 g/dL (ref 1.5–4.5)
Glucose: 84 mg/dL (ref 65–99)
Potassium: 4.7 mmol/L (ref 3.5–5.2)
Sodium: 133 mmol/L — ABNORMAL LOW (ref 134–144)
Total Protein: 7.1 g/dL (ref 6.0–8.5)

## 2019-08-15 LAB — CBC WITH DIFFERENTIAL/PLATELET
Basophils Absolute: 0.1 10*3/uL (ref 0.0–0.2)
Basos: 1 %
EOS (ABSOLUTE): 0.2 10*3/uL (ref 0.0–0.4)
Eos: 3 %
Hematocrit: 39.2 % (ref 34.0–46.6)
Hemoglobin: 13.6 g/dL (ref 11.1–15.9)
Immature Grans (Abs): 0 10*3/uL (ref 0.0–0.1)
Immature Granulocytes: 0 %
Lymphocytes Absolute: 1.5 10*3/uL (ref 0.7–3.1)
Lymphs: 28 %
MCH: 31.3 pg (ref 26.6–33.0)
MCHC: 34.7 g/dL (ref 31.5–35.7)
MCV: 90 fL (ref 79–97)
Monocytes Absolute: 0.4 10*3/uL (ref 0.1–0.9)
Monocytes: 8 %
Neutrophils Absolute: 3.1 10*3/uL (ref 1.4–7.0)
Neutrophils: 60 %
Platelets: 239 10*3/uL (ref 150–450)
RBC: 4.35 x10E6/uL (ref 3.77–5.28)
RDW: 12.1 % (ref 11.7–15.4)
WBC: 5.2 10*3/uL (ref 3.4–10.8)

## 2019-08-15 LAB — LIPID PANEL
Chol/HDL Ratio: 2.7 ratio (ref 0.0–4.4)
Cholesterol, Total: 191 mg/dL (ref 100–199)
HDL: 70 mg/dL (ref >39)
LDL Chol Calc (NIH): 109 mg/dL — ABNORMAL HIGH (ref 0–99)
Triglycerides: 62 mg/dL (ref 0–149)
VLDL Cholesterol Cal: 12 mg/dL (ref 5–40)

## 2019-08-15 NOTE — Telephone Encounter (Signed)
Written by Margaretann Loveless, PA-C on 08/15/2019 11:54 AM EDT Seen by patient [name] on 08/15/2019 11:54 AM

## 2019-08-15 NOTE — Telephone Encounter (Signed)
-----   Message from Margaretann Loveless, [place] sent at 08/15/2019 11:54 AM EDT ----- Blood count is normal. Kidney and liver function are normal. Sugar is normal. Sodium and chloride are borderline low, so ok to add a little salt to foods. Potassium normal. Cholesterol is normal and stable.

## 2019-10-18 ENCOUNTER — Other Ambulatory Visit: Payer: Self-pay

## 2019-10-18 ENCOUNTER — Encounter: Payer: Self-pay | Admitting: Physician Assistant

## 2019-10-18 ENCOUNTER — Ambulatory Visit (INDEPENDENT_AMBULATORY_CARE_PROVIDER_SITE_OTHER): Payer: Medicare Other | Admitting: Physician Assistant

## 2019-10-18 VITALS — BP 129/81 | HR 73 | Temp 98.4°F | Wt 131.0 lb

## 2019-10-18 DIAGNOSIS — L900 Lichen sclerosus et atrophicus: Secondary | ICD-10-CM | POA: Diagnosis not present

## 2019-10-18 MED ORDER — MOMETASONE FUROATE 0.1 % EX CREA: 1.0000 "application " | TOPICAL_CREAM | Freq: Every day | CUTANEOUS | 0 refills | Status: DC

## 2019-10-18 NOTE — Patient Instructions (Signed)
Lichen Sclerosus Lichen sclerosus is a skin problem. It can happen on any part of the body, but it commonly involves the anal or genital areas. It can cause itching and discomfort in these areas. Treatment can help to control symptoms. When the genital area is affected, getting treatment is important because the condition can cause scarring that may lead to other problems. What are the causes? The cause of this condition is not known. It may be related to an overactive immune system or a lack of certain hormones. Lichen sclerosus is not an infection or a fungus, and it is not passed from one person to another (not contagious). What increases the risk? This condition is more likely to develop in women, usually after menopause. What are the signs or symptoms? Symptoms of this condition include:  Thin, wrinkled, white areas on the skin.  Thickened white areas on the skin.  Red and swollen patches (lesions) on the skin.  Tears or cracks in the skin.  Bruising.  Blood blisters.  Severe itching.  Pain, itching, or burning when urinating. Constipation is also common in people with lichen sclerosus. How is this diagnosed? This condition may be diagnosed with a physical exam. In some cases, a tissue sample (biopsy sample) may be removed to be looked at under a microscope. How is this treated? This condition is usually treated with medicated creams or ointments (topical steroids) that are applied over the affected areas. In some cases, treatment may also include medicines that are taken by mouth. Surgery may be needed in more severe cases that are causing problems such as scarring. Follow these instructions at home:  Take or use over-the-counter and prescription medicines only as told by your health care provider.  Use creams or ointments as told by your health care provider.  Do not scratch the affected areas of skin.  If you are a woman, be sure to keep the vaginal area as clean and dry  as possible.  Clean the affected area of skin gently with water. Avoid using rough towels or toilet paper.  Keep all follow-up visits as told by your health care provider. This is important. Contact a health care provider if:  You have increasing redness, swelling, or pain in the affected area.  You have fluid, blood, or pus coming from the affected area.  You have new lesions on your skin.  You have a fever.  You have pain during sex. Summary  Lichen sclerosus is a skin problem. When the genital area is affected, getting treatment is important because the condition can cause scarring that may lead to other problems.  This condition is usually treated with medicated creams or ointments (topical steroids) that are applied over the affected areas.  Take or use over-the-counter and prescription medicines only as told by your health care provider.  Contact a health care provider if you have new lesions on your skin, have pain during sex, or have increasing redness, swelling, or pain in the affected area.  Keep all follow-up visits as told by your health care provider. This is important. This information is not intended to replace advice given to you by your health care provider. Make sure you discuss any questions you have with your health care provider. Document Revised: 05/20/2017 Document Reviewed: 05/20/2017 Elsevier Patient Education  2020 Elsevier Inc.  

## 2019-10-18 NOTE — Progress Notes (Signed)
     Established patient visit   Patient: [name]   DOB: 15-Feb-1950   69 y.o. Female  MRN: [id number] Visit Date: 10/18/2019  Today's healthcare provider: Margaretann Loveless, PA-C   No chief complaint on file.  Subjective    Vaginal Pain The patient's pertinent negatives include no pelvic pain or vaginal discharge. This is a new problem. The current episode started in the past 7 days. Pertinent negatives include no back pain, dysuria, flank pain, frequency, headaches, hematuria, rash or urgency. Nothing aggravates the symptoms. She is postmenopausal.    Medications: Outpatient Medications Prior to Visit  Medication Sig  . amitriptyline (ELAVIL) 50 MG tablet Take 1 tablet (50 mg total) by mouth at bedtime.  . Calcium Carbonate-Vitamin D 600-200 MG-UNIT CAPS Take 2 tablets by mouth daily.  . Calcium Glycerophosphate (PRELIEF PO) Take 2 tablets by mouth daily. As needed   . Cholecalciferol 125 MCG (5000 UT) TABS Take by mouth.  . fluticasone (FLONASE) 50 MCG/ACT nasal spray Place 2 sprays into both nostrils daily.  Marland Kitchen MYRBETRIQ 50 MG TB24 tablet   . pantoprazole (PROTONIX) 40 MG tablet Take 1 tablet (40 mg total) by mouth daily.  Marland Kitchen triamterene-hydrochlorothiazide (MAXZIDE-25) 37.5-25 MG tablet Take 1 tablet by mouth daily.  . vitamin C (ASCORBIC ACID) 500 MG tablet Take 500 mg by mouth daily.   No facility-administered medications prior to visit.    Review of Systems  Constitutional: Negative.   Respiratory: Negative.   Gastrointestinal: Negative.   Genitourinary: Positive for vaginal pain. Negative for decreased urine volume, difficulty urinating, dyspareunia, dysuria, enuresis, flank pain, frequency, genital sores, hematuria, menstrual problem, pelvic pain, urgency, vaginal bleeding and vaginal discharge.  Musculoskeletal: Negative for back pain.  Skin: Negative for rash.  Neurological: Negative for dizziness, light-headedness and headaches.      Objective    There  were no vitals taken for this visit.   Physical Exam Vitals reviewed.  Constitutional:      General: She is not in acute distress.    Appearance: Normal appearance. She is well-developed and normal weight. She is not ill-appearing.  HENT:     Head: Normocephalic and atraumatic.  Pulmonary:     Effort: Pulmonary effort is normal. No respiratory distress.  Musculoskeletal:     Cervical back: Normal range of motion and neck supple.  Neurological:     Mental Status: She is alert.  Psychiatric:        Mood and Affect: Mood normal.        Behavior: Behavior normal.        Thought Content: Thought content normal.        Judgment: Judgment normal.      No results found for any visits on 10/18/19.  Assessment & Plan     1. Lichen sclerosus Patient does have a history of lichen sclerosis. States issue is similar to previous. Will treat with mometasone as below. She is to call if not improving.  - mometasone (ELOCON) 0.1 % cream; Apply 1 application topically daily.  Dispense: 45 g; Refill: 0  No follow-ups on file.      Delmer Islam, PA-C, have reviewed all documentation for this visit. The documentation on 10/24/19 for the exam, diagnosis, procedures, and orders are all accurate and complete.   Reine Just  [facility] [phone] (phone) [phone] (fax)  [facility]

## 2019-11-06 ENCOUNTER — Other Ambulatory Visit: Payer: Self-pay | Admitting: Physician Assistant

## 2019-11-06 DIAGNOSIS — K219 Gastro-esophageal reflux disease without esophagitis: Secondary | ICD-10-CM

## 2019-11-06 DIAGNOSIS — I10 Essential (primary) hypertension: Secondary | ICD-10-CM

## 2019-11-06 DIAGNOSIS — N3010 Interstitial cystitis (chronic) without hematuria: Secondary | ICD-10-CM

## 2019-11-17 ENCOUNTER — Other Ambulatory Visit: Payer: Self-pay | Admitting: Physician Assistant

## 2019-11-17 DIAGNOSIS — Z1231 Encounter for screening mammogram for malignant neoplasm of breast: Secondary | ICD-10-CM

## 2020-02-01 ENCOUNTER — Other Ambulatory Visit: Payer: Self-pay | Admitting: Physician Assistant

## 2020-02-01 DIAGNOSIS — K219 Gastro-esophageal reflux disease without esophagitis: Secondary | ICD-10-CM

## 2020-02-01 DIAGNOSIS — N3010 Interstitial cystitis (chronic) without hematuria: Secondary | ICD-10-CM

## 2020-02-01 DIAGNOSIS — I10 Essential (primary) hypertension: Secondary | ICD-10-CM

## 2020-02-07 DIAGNOSIS — H900 Conductive hearing loss, bilateral: Secondary | ICD-10-CM | POA: Diagnosis not present

## 2020-02-26 ENCOUNTER — Other Ambulatory Visit: Payer: Self-pay

## 2020-02-26 ENCOUNTER — Ambulatory Visit
Admission: RE | Admit: 2020-02-26 | Discharge: 2020-02-26 | Disposition: A | Discharge status: Home / Self Care | Payer: Medicare Other | Source: Ambulatory Visit | Attending: Physician Assistant | Admitting: Physician Assistant

## 2020-02-26 DIAGNOSIS — Z1231 Encounter for screening mammogram for malignant neoplasm of breast: Secondary | ICD-10-CM | POA: Insufficient documentation

## 2020-04-26 ENCOUNTER — Other Ambulatory Visit: Payer: Self-pay | Admitting: Physician Assistant

## 2020-04-26 DIAGNOSIS — I10 Essential (primary) hypertension: Secondary | ICD-10-CM

## 2020-04-26 DIAGNOSIS — N3010 Interstitial cystitis (chronic) without hematuria: Secondary | ICD-10-CM

## 2020-04-26 DIAGNOSIS — K219 Gastro-esophageal reflux disease without esophagitis: Secondary | ICD-10-CM

## 2020-04-26 NOTE — Telephone Encounter (Signed)
Notes to clinic: Patient is scheduled for a appointment on 08/15/2020 Review for enough medication until that time   Requested Prescriptions  Pending Prescriptions Disp Refills   triamterene-hydrochlorothiazide (MAXZIDE-25) 37.5-25 MG tablet [Pharmacy Med Name: TRIAMTERENE-HCTZ 37.5-25 MG TB] 90 tablet 0    Sig: Take 1 tablet by mouth daily.      Cardiovascular: Diuretic Combos Failed - 04/26/2020  9:48 AM      Failed - Na in normal range and within 360 days    Sodium  Date Value Ref Range Status  08/14/2019 133 (L) 134 - 144 mmol/L Final          Failed - Valid encounter within last 6 months    Recent Outpatient Visits           6 months ago Lichen sclerosus   [facility] Joycelyn Man [place], [place]   11 months ago Essential hypertension   [facility] [place], Alessandra Bevels, [place]   1 year ago Dysfunction of right eustachian tube   [facility] [place], [place], [place]   1 year ago Medicare annual wellness visit, subsequent   [facility] [place], [place], [place]   2 years ago Welcome to [employer] preventive visit   [facility] Rosezetta Schlatter, Victorino Dike M, PA-C                Passed - K in normal range and within 360 days    Potassium  Date Value Ref Range Status  08/14/2019 4.7 3.5 - 5.2 mmol/L Final          Passed - Cr in normal range and within 360 days    Creat  Date Value Ref Range Status  11/30/2016 0.66 0.50 - 0.99 mg/dL Final    Comment:    For patients >66 years of age, the reference limit for Creatinine is approximately 13% higher for people identified as African-American. .    Creatinine, Ser  Date Value Ref Range Status  08/14/2019 0.72 0.57 - 1.00 mg/dL Final          Passed - Ca in normal range and within 360 days    Calcium  Date Value Ref Range Status  08/14/2019 9.2 8.7 - 10.3 mg/dL Final          Passed - Last BP in normal range    BP Readings from Last 1  Encounters:  10/18/19 129/81            pantoprazole (PROTONIX) 40 MG tablet [Pharmacy Med Name: PANTOPRAZOLE SOD DR 40 MG TAB] 90 tablet 0    Sig: Take 1 tablet (40 mg total) by mouth daily.      Gastroenterology: Proton Pump Inhibitors Passed - 04/26/2020  9:48 AM      Passed - Valid encounter within last 12 months    Recent Outpatient Visits           6 months ago Lichen sclerosus   [facility] Joycelyn Man [place], [place]   11 months ago Essential hypertension   [facility] [place], Alessandra Bevels, [place]   1 year ago Dysfunction of right eustachian tube   [facility] [place], Alessandra Bevels, PA-C   1 year ago Medicare annual wellness visit, subsequent   [facility] [place], [place], [place]   2 years ago Welcome to [employer] preventive visit   [facility] [place], [place], [place]  amitriptyline (ELAVIL) 50 MG tablet [Pharmacy Med Name: AMITRIPTYLINE HCL 50 MG TAB] 90 tablet 0    Sig: Take 1 tablet (50 mg total) by mouth at bedtime.      Psychiatry:  Antidepressants - Heterocyclics (TCAs) Failed - 04/26/2020  9:48 AM      Failed - Valid encounter within last 6 months    Recent Outpatient Visits           6 months ago Lichen sclerosus   [facility] Joycelyn Man [place], [place]   11 months ago Essential hypertension   [facility] [place], Alessandra Bevels, [place]   1 year ago Dysfunction of right eustachian tube   [facility], Alessandra Bevels, [place]   1 year ago Medicare annual wellness visit, subsequent   [facility] [place], [place], [place]   2 years ago Welcome to [employer] preventive visit   [facility] [place], [place], [place]

## 2020-04-26 NOTE — Telephone Encounter (Signed)
Please review. Thanks!  

## 2020-06-08 DIAGNOSIS — N390 Urinary tract infection, site not specified: Secondary | ICD-10-CM | POA: Diagnosis not present

## 2020-06-08 DIAGNOSIS — R300 Dysuria: Secondary | ICD-10-CM | POA: Diagnosis not present

## 2020-06-08 DIAGNOSIS — A499 Bacterial infection, unspecified: Secondary | ICD-10-CM | POA: Diagnosis not present

## 2020-06-12 ENCOUNTER — Encounter: Payer: Self-pay | Admitting: Physician Assistant

## 2020-06-21 ENCOUNTER — Ambulatory Visit
Admission: EM | Admit: 2020-06-21 | Discharge: 2020-06-21 | Disposition: A | Discharge status: Home / Self Care | Payer: Medicare Other | Attending: Emergency Medicine | Admitting: Emergency Medicine

## 2020-06-21 ENCOUNTER — Other Ambulatory Visit: Payer: Self-pay

## 2020-06-21 ENCOUNTER — Encounter: Payer: Self-pay | Admitting: Emergency Medicine

## 2020-06-21 DIAGNOSIS — N390 Urinary tract infection, site not specified: Secondary | ICD-10-CM | POA: Insufficient documentation

## 2020-06-21 LAB — POCT URINALYSIS DIP (MANUAL ENTRY)
Bilirubin, UA: NEGATIVE
Blood, UA: NEGATIVE
Glucose, UA: 100 mg/dL — AB
Nitrite, UA: POSITIVE — AB
Protein Ur, POC: 30 mg/dL — AB
Spec Grav, UA: 1.015 (ref 1.010–1.025)
Urobilinogen, UA: 2 E.U./dL — AB
pH, UA: 6.5 (ref 5.0–8.0)

## 2020-06-21 MED ORDER — NITROFURANTOIN MONOHYD MACRO 100 MG PO CAPS: 100.0000 mg | ORAL_CAPSULE | Freq: Two times a day (BID) | ORAL | 0 refills | Status: DC

## 2020-06-21 NOTE — ED Provider Notes (Signed)
[name]    CSN: [id number] Arrival date & time: 06/21/20  1404      History   Chief Complaint Chief Complaint  Patient presents with  . Dysuria  . Urinary Frequency    HPI [name] is a 70 y.o. female.   Patient presents with dysuria and frequency for 5 days.  She reports suprapubic abdominal pressure.  She states she was treated with Cipro for a UTI on 06/08/2020.  She denies fever, chills, or other symptoms.  Her medical history includes hypertension, allergic rhinitis, GERD, chronic venous insufficiency.  The history is provided by the patient and medical records.    Past Medical History:  Diagnosis Date  . Complication of anesthesia   . GERD (gastroesophageal reflux disease)   . Hyperlipidemia   . Hypertension   . IC (interstitial cystitis)   . PONV (postoperative nausea and vomiting)    NO PROBLEM     4/18 WITH RETINAL SURGERY    Patient Active Problem List   Diagnosis Date Noted  . Otosclerosis of right ear 08/14/2019  . Hyperlipidemia   . Age-related osteoporosis without current pathological fracture 06/03/2017  . History of right cataract surgery 09/29/2016  . Chest pain on exertion 05/28/2016  . Right retinal detachment 05/01/2016  . Varicose veins of both lower extremities with inflammation 11/21/2015  . Chronic venous insufficiency 11/21/2015  . Pain in limb 11/21/2015  . GERD (gastroesophageal reflux disease) 11/23/2014  . Allergic rhinitis 07/17/2014  . History of tobacco use 02/26/2006  . Interstitial cystitis 02/22/2006  . Abdominal hernia with gangrene and obstruction 02/22/2006  . Hypertension 04/22/1999    Past Surgical History:  Procedure Laterality Date  . BLADDER SUSPENSION     TRANSVAGINAL SLING  . BUNIONECTOMY  12/02/2017  . CATARACT EXTRACTION W/PHACO Right 09/29/2016   Procedure: CATARACT EXTRACTION PHACO AND INTRAOCULAR LENS PLACEMENT (IOC);  Surgeon: Galen Manila, MD;  Location: [facility] ORS;  Service:  Ophthalmology;  Laterality: Right;  [place] 00:47 AP% 21.6 CDE 10.26 fluid pack lot # [id number]  . CHOLECYSTECTOMY    . EYE SURGERY    . INSERTION OF MESH  2008   Trans vaginal mesh  . LASER ABLATION Right 11/21/2015   [facility] Dr. Gilda Crease    OB History    Gravida  2   Para  2   Term  2   Preterm      AB      Living  2     SAB      IAB      Ectopic      Multiple      Live Births  2            Home Medications    Prior to Admission medications   Medication Sig Start Date End Date Taking? Authorizing Provider  amitriptyline (ELAVIL) 50 MG tablet Take 1 tablet (50 mg total) by mouth at bedtime. 04/26/20  Yes Flinchum, Eula Fried, FNP  Calcium Carbonate-Vitamin D 600-200 MG-UNIT CAPS Take 2 tablets by mouth daily. 07/15/07  Yes [provider]  Calcium Glycerophosphate (PRELIEF PO) Take 2 tablets by mouth daily. As needed   Yes [provider]  Cholecalciferol 125 MCG (5000 UT) TABS Take by mouth. 12/01/17  Yes [provider]  Flaxseed, Linseed, (FLAXSEED OIL PO) Take by mouth.   Yes [provider]  fluticasone (FLONASE) 50 MCG/ACT nasal spray Place 2 sprays into both nostrils daily. 07/02/16  Yes Joycelyn Man  M, PA-C  Lifitegrast (XIIDRA) 5 % SOLN Apply to eye.   Yes [provider]  MYRBETRIQ 50 MG TB24 tablet  06/03/17  Yes [provider]  nitrofurantoin, macrocrystal-monohydrate, (MACROBID) 100 MG capsule Take 1 capsule (100 mg total) by mouth 2 (two) times daily. 06/21/20  Yes Mickie Bail, NP  pantoprazole (PROTONIX) 40 MG tablet Take 1 tablet (40 mg total) by mouth daily. 04/26/20  Yes Flinchum, Eula Fried, FNP  triamterene-hydrochlorothiazide (MAXZIDE-25) 37.5-25 MG tablet Take 1 tablet by mouth daily. 04/26/20  Yes Flinchum, Eula Fried, FNP  vitamin C (ASCORBIC ACID) 500 MG tablet Take 500 mg by mouth daily.   Yes [provider]  mometasone (ELOCON) 0.1 % cream Apply 1 application  topically daily. 10/18/19   Margaretann Loveless, PA-C    Family History Family History  Problem Relation Age of Onset  . Cancer Father        Bladder  . Bladder Cancer Father   . Hypertension Father   . Coronary artery disease Father        Stent Placed 1990  . Liver disease Father   . Congestive Heart Failure Mother   . Healthy Sister   . Hypertension Sister   . Breast cancer Paternal Aunt   . Healthy Son   . Healthy Son     Social History Social History   Tobacco Use  . Smoking status: Former Smoker    Packs/day: 1.00    Years: 35.00    Pack years: 35.00    Quit date: 01/20/1996    Years since quitting: 24.4  . Smokeless tobacco: Never Used  Vaping Use  . Vaping Use: Never used  Substance Use Topics  . Alcohol use: No  . Drug use: No     Allergies   Doxycycline, Penicillins, Pneumococcal 13-val conj vacc, and Sulfa antibiotics   Review of Systems Review of Systems  Constitutional: Negative for chills and fever.  Respiratory: Negative for cough and shortness of breath.   Cardiovascular: Negative for chest pain and palpitations.  Gastrointestinal: Negative for abdominal pain and vomiting.  Genitourinary: Positive for dysuria and frequency. Negative for flank pain, hematuria and pelvic pain.  Skin: Negative for color change and rash.  All other systems reviewed and are negative.    Physical Exam Triage Vital Signs ED Triage Vitals  Enc Vitals Group     BP      Pulse      Resp      Temp      Temp src      SpO2      Weight      Height      Head Circumference      Peak Flow      Pain Score      Pain Loc      Pain Edu?      Excl. in GC?    No data found.  Updated Vital Signs BP (!) 143/83 (BP Location: Left Arm)   Pulse 94   Temp 99.1 F (37.3 C) (Oral)   Resp 13   SpO2 94%   Visual Acuity Right Eye Distance:   Left Eye Distance:   Bilateral Distance:    Right Eye Near:   Left Eye Near:    Bilateral Near:     Physical Exam Vitals  and nursing note reviewed.  Constitutional:      General: She is not in acute distress.    Appearance: She is well-developed. She  is not ill-appearing.  HENT:     Head: Normocephalic and atraumatic.     Mouth/Throat:     Mouth: Mucous membranes are moist.  Eyes:     Conjunctiva/sclera: Conjunctivae normal.  Cardiovascular:     Rate and Rhythm: Normal rate and regular rhythm.     Heart sounds: Normal heart sounds.  Pulmonary:     Effort: Pulmonary effort is normal. No respiratory distress.     Breath sounds: Normal breath sounds.  Abdominal:     General: Bowel sounds are normal.     Palpations: Abdomen is soft.     Tenderness: There is no abdominal tenderness. There is no right CVA tenderness, left CVA tenderness, guarding or rebound.  Musculoskeletal:     Cervical back: Neck supple.  Skin:    General: Skin is warm and dry.  Neurological:     General: No focal deficit present.     Mental Status: She is alert and oriented to person, place, and time.     Gait: Gait normal.  Psychiatric:        Mood and Affect: Mood normal.        Behavior: Behavior normal.      UC Treatments / Results  Labs (all labs ordered are listed, but only abnormal results are displayed) Labs Reviewed  POCT URINALYSIS DIP (MANUAL ENTRY) - Abnormal; Notable for the following components:      Result Value   Clarity, UA cloudy (*)    Glucose, UA =100 (*)    Ketones, POC UA trace (5) (*)    Protein Ur, POC =30 (*)    Urobilinogen, UA 2.0 (*)    Nitrite, UA Positive (*)    Leukocytes, UA Large (3+) (*)    All other components within normal limits  URINE CULTURE    EKG   Radiology No results found.  Procedures Procedures (including critical care time)  Medications Ordered in UC Medications - No data to display  Initial Impression / Assessment and Plan / UC Course  I have reviewed the triage vital signs and the nursing notes.  Pertinent labs & imaging results that were available during  my care of the patient were reviewed by me and considered in my medical decision making (see chart for details).   UTI.  Urine culture pending.  Patient was recently treated with Cipro (06/08/2020); she is allergic to several antibiotics.  Treating with Macrobid.  Discussed with patient that we will call her if the urine culture shows the need to change or discontinue the antibiotic.  Instructed her to follow-up with her PCP if her symptoms are not improving.  She agrees to plan of care.   Final Clinical Impressions(s) / UC Diagnoses   Final diagnoses:  Urinary tract infection without hematuria, site unspecified     Discharge Instructions     Take the antibiotic as directed.  The urine culture is pending.  We will call you if it shows the need to change or discontinue your antibiotic.    Follow up with your primary care provider if your symptoms are not improving.        ED Prescriptions    Medication Sig Dispense Auth. Provider   nitrofurantoin, macrocrystal-monohydrate, (MACROBID) 100 MG capsule Take 1 capsule (100 mg total) by mouth 2 (two) times daily. 10 capsule Mickie Bail, NP     PDMP not reviewed this encounter.   Mickie Bail, NP 06/21/20 1441

## 2020-06-21 NOTE — Discharge Instructions (Addendum)
Take the antibiotic as directed.  The urine culture is pending.  We will call you if it shows the need to change or discontinue your antibiotic.    Follow up with your primary care provider if your symptoms are not improving.    

## 2020-06-21 NOTE — ED Triage Notes (Signed)
Patient c/o dysuria and increased urinary frequency x 5 days.   Patient denies hematuria.    Patient endorses "lower ABD pressure".   Patient endorses being treated for UTI at another urgent care clinic on 5/21 and was given ciprofloxacin which " helped and then symptoms started back".   Patient endorses history of interstitial cystitis.

## 2020-06-22 LAB — URINE CULTURE

## 2020-06-25 DIAGNOSIS — R3915 Urgency of urination: Secondary | ICD-10-CM | POA: Diagnosis not present

## 2020-06-25 DIAGNOSIS — R102 Pelvic and perineal pain: Secondary | ICD-10-CM | POA: Diagnosis not present

## 2020-06-25 DIAGNOSIS — R300 Dysuria: Secondary | ICD-10-CM | POA: Diagnosis not present

## 2020-07-08 DIAGNOSIS — M6289 Other specified disorders of muscle: Secondary | ICD-10-CM | POA: Diagnosis not present

## 2020-07-08 DIAGNOSIS — R300 Dysuria: Secondary | ICD-10-CM | POA: Diagnosis not present

## 2020-07-08 DIAGNOSIS — M6281 Muscle weakness (generalized): Secondary | ICD-10-CM | POA: Diagnosis not present

## 2020-07-08 DIAGNOSIS — R102 Pelvic and perineal pain: Secondary | ICD-10-CM | POA: Diagnosis not present

## 2020-07-08 DIAGNOSIS — R3915 Urgency of urination: Secondary | ICD-10-CM | POA: Diagnosis not present

## 2020-07-08 DIAGNOSIS — M62838 Other muscle spasm: Secondary | ICD-10-CM | POA: Diagnosis not present

## 2020-07-10 DIAGNOSIS — M3501 Sicca syndrome with keratoconjunctivitis: Secondary | ICD-10-CM | POA: Diagnosis not present

## 2020-07-23 DIAGNOSIS — R102 Pelvic and perineal pain: Secondary | ICD-10-CM | POA: Diagnosis not present

## 2020-07-23 DIAGNOSIS — R300 Dysuria: Secondary | ICD-10-CM | POA: Diagnosis not present

## 2020-07-23 DIAGNOSIS — R278 Other lack of coordination: Secondary | ICD-10-CM | POA: Diagnosis not present

## 2020-07-23 DIAGNOSIS — M6281 Muscle weakness (generalized): Secondary | ICD-10-CM | POA: Diagnosis not present

## 2020-07-23 DIAGNOSIS — M62838 Other muscle spasm: Secondary | ICD-10-CM | POA: Diagnosis not present

## 2020-07-23 DIAGNOSIS — R3915 Urgency of urination: Secondary | ICD-10-CM | POA: Diagnosis not present

## 2020-07-26 ENCOUNTER — Other Ambulatory Visit: Payer: Self-pay | Admitting: Physician Assistant

## 2020-07-26 DIAGNOSIS — K219 Gastro-esophageal reflux disease without esophagitis: Secondary | ICD-10-CM

## 2020-07-26 DIAGNOSIS — I10 Essential (primary) hypertension: Secondary | ICD-10-CM

## 2020-07-26 DIAGNOSIS — N3010 Interstitial cystitis (chronic) without hematuria: Secondary | ICD-10-CM

## 2020-07-26 NOTE — Telephone Encounter (Signed)
Medication Refill - Medication: Amitriptyline, pantoprazole, triamterene Hydrochlorothiazide   Has the patient contacted their pharmacy? Yes.   Pt called stating that she has contacted pharmacy and that they have tried to reach office with no response. Please advise.  (Agent: If no, request that the patient contact the pharmacy for the refill.) (Agent: If yes, when and what did the pharmacy advise?)  Preferred Pharmacy (with phone number or street name):  [facility] - [place], [place] - [address]  [address] [place] [address]  Phone: [phone] Fax: [phone]  Hours: Not open 24 hours    Agent: Please be advised that RX refills may take up to 3 business days. We ask that you follow-up with your pharmacy.

## 2020-07-26 NOTE — Telephone Encounter (Signed)
   Notes to clinic:  Patient has appointment on 09/12/20 Last appt was canceled by provider Review for refill    Requested Prescriptions  Pending Prescriptions Disp Refills   amitriptyline (ELAVIL) 50 MG tablet 90 tablet 0    Sig: Take 1 tablet (50 mg total) by mouth at bedtime.      There is no refill protocol information for this order      pantoprazole (PROTONIX) 40 MG tablet 90 tablet 0    Sig: Take 1 tablet (40 mg total) by mouth daily.      There is no refill protocol information for this order      triamterene-hydrochlorothiazide (MAXZIDE-25) 37.5-25 MG tablet 90 tablet 0    Sig: Take 1 tablet by mouth daily.      There is no refill protocol information for this order

## 2020-07-27 ENCOUNTER — Other Ambulatory Visit: Payer: Self-pay | Admitting: Adult Health

## 2020-07-27 DIAGNOSIS — I10 Essential (primary) hypertension: Secondary | ICD-10-CM

## 2020-07-29 ENCOUNTER — Other Ambulatory Visit: Payer: Self-pay | Admitting: Adult Health

## 2020-07-29 DIAGNOSIS — N3010 Interstitial cystitis (chronic) without hematuria: Secondary | ICD-10-CM

## 2020-07-29 DIAGNOSIS — K219 Gastro-esophageal reflux disease without esophagitis: Secondary | ICD-10-CM

## 2020-07-29 MED ORDER — AMITRIPTYLINE HCL 50 MG PO TABS: 50.0000 mg | ORAL_TABLET | Freq: Every day | ORAL | 0 refills | Status: DC

## 2020-07-29 MED ORDER — PANTOPRAZOLE SODIUM 40 MG PO TBEC
40.0000 mg | DELAYED_RELEASE_TABLET | Freq: Every day | ORAL | 0 refills | Status: DC
Start: 2020-07-29 — End: 2020-10-25

## 2020-07-29 MED ORDER — TRIAMTERENE-HCTZ 37.5-25 MG PO TABS: 1.0000 | ORAL_TABLET | Freq: Every day | ORAL | 0 refills | Status: DC

## 2020-08-15 ENCOUNTER — Encounter: Payer: Self-pay | Admitting: Physician Assistant

## 2020-08-20 DIAGNOSIS — R300 Dysuria: Secondary | ICD-10-CM | POA: Diagnosis not present

## 2020-08-20 DIAGNOSIS — R278 Other lack of coordination: Secondary | ICD-10-CM | POA: Diagnosis not present

## 2020-08-20 DIAGNOSIS — M62838 Other muscle spasm: Secondary | ICD-10-CM | POA: Diagnosis not present

## 2020-08-20 DIAGNOSIS — R3915 Urgency of urination: Secondary | ICD-10-CM | POA: Diagnosis not present

## 2020-08-20 DIAGNOSIS — M6281 Muscle weakness (generalized): Secondary | ICD-10-CM | POA: Diagnosis not present

## 2020-08-20 DIAGNOSIS — R102 Pelvic and perineal pain: Secondary | ICD-10-CM | POA: Diagnosis not present

## 2020-08-30 ENCOUNTER — Ambulatory Visit
Admission: RE | Admit: 2020-08-30 | Discharge: 2020-08-30 | Disposition: A | Discharge status: Home / Self Care | Payer: Medicare Other | Source: Ambulatory Visit | Attending: Emergency Medicine | Admitting: Emergency Medicine

## 2020-08-30 ENCOUNTER — Other Ambulatory Visit: Payer: Self-pay

## 2020-08-30 VITALS — BP 132/77 | HR 91 | Temp 98.9°F | Resp 18

## 2020-08-30 DIAGNOSIS — H6983 Other specified disorders of Eustachian tube, bilateral: Secondary | ICD-10-CM

## 2020-08-30 MED ORDER — AZELASTINE HCL 0.1 % NA SOLN: 2.0000 | Freq: Two times a day (BID) | NASAL | 0 refills | Status: DC

## 2020-08-30 MED ORDER — FLUTICASONE PROPIONATE 50 MCG/ACT NA SUSP: 2.0000 | Freq: Every day | NASAL | 0 refills | Status: DC

## 2020-08-30 NOTE — ED Triage Notes (Signed)
Patient c/o RT ear fullness x 1 day.   Patient endorses tenderness on the outside of RT ear.   Patient endorses otosclerosis in the RT ear diagnosed by the ENT doctor.   Patient denies any ear drainage.   Patient has used a heating pad and decongestant with no relief of symptoms.

## 2020-08-30 NOTE — ED Provider Notes (Signed)
Chief Complaint   Chief Complaint  Patient presents with   Ear Fullness   APPT 1245     Subjective, HPI  [name] is a very pleasant [name] who presents with right ear fullness which started today.  Patient endorses some tenderness to the outside of the right ear.  She endorses a history of otosclerosis in the right ear which was previously diagnosed with her ENT.  No drainage to the ears.  She has tried using a heating pad and a decongestant without relief of symptoms.  No fever, chills, vomiting or recent illness.  Patient's problem list, past medical and social history, medications, and allergies were reviewed by me and updated in Epic.   ROS  See HPI.  Objective   Vitals:   08/30/20 1301  BP: 132/77  Pulse: 91  Resp: 18  Temp: 98.9 F (37.2 C)  SpO2: 96%     General: Appears well-developed and well-nourished. No acute distress.  HEENT Head: Normocephalic and atraumatic. Eyes: Conjunctivae and EOM are normal. No eye drainage or scleral icterus bilaterally.  Ears: Bilateral: Hearing grossly intact. No drainage or visible deformity. No mastoid erythema, edema, or tenderness.  Right: TM nonerythematous with bulging and effusion.  No perforation.  No purulence. Left: TM nonerythematous with bulging and effusion.  No perforation.  No purulence. Neck: Normal range of motion, neck is supple.  Cardiovascular: Normal rate Pulm/Chest: No respiratory distress.  Musculoskeletal: No joint deformity, normal range of motion.  Skin: Skin is warm and dry.    Vital signs and nursing note reviewed.    Assessment & Plan  1. Eustachian tube dysfunction, bilateral - azelastine (ASTELIN) 0.1 % nasal spray; Place 2 sprays into both nostrils 2 (two) times daily for 7 days. Use in each nostril as directed  Dispense: 30 mL; Refill: 0  [name] presents with right ear fullness which started today.  Patient endorses some tenderness to the outside of the right ear.  She  endorses a history of otosclerosis in the right ear which was previously diagnosed with her ENT.  No drainage to the ears.  She has tried using a heating pad and a decongestant without relief of symptoms.  No fever, chills, vomiting or recent illness.  Chart review completed.  Given symptoms along with assessment findings, likely bilateral eustachian tube dysfunction.  Rx'd Astelin nasal spray to the patient's preferred pharmacy and advised the use of warm compresses alternating between the left and right side of her neck to help with mobilization of mucus.  Advised to return with any worsening of symptoms to include increased pain, drainage from the ears, fever, headache or vomiting.  Patient verbalized understanding and agreed with plan.  Patient stable upon discharge.  Return as needed.  Plan:   Discharge Instructions      Use astelin nasal spray as directed and prescribed.  Apply warm compresses alternating between the left and the right side of your neck to help with mobilization of mucus.  If you begin to have any worsening of symptoms to include increased pain, drainage from the ears, fever, headache or vomiting return to clinic for reevaluation.         Laura Greenhouse, FNP 08/30/20 1540

## 2020-08-30 NOTE — Discharge Instructions (Addendum)
Use astelin nasal spray as directed and prescribed.  Apply warm compresses alternating between the left and the right side of your neck to help with mobilization of mucus.  If you begin to have any worsening of symptoms to include increased pain, drainage from the ears, fever, headache or vomiting return to clinic for reevaluation.

## 2020-09-12 ENCOUNTER — Ambulatory Visit (INDEPENDENT_AMBULATORY_CARE_PROVIDER_SITE_OTHER): Payer: Medicare Other | Admitting: Family Medicine

## 2020-09-12 ENCOUNTER — Encounter: Payer: Self-pay | Admitting: Family Medicine

## 2020-09-12 ENCOUNTER — Other Ambulatory Visit: Payer: Self-pay

## 2020-09-12 VITALS — BP 135/74 | HR 81 | Temp 97.8°F | Ht 66.0 in | Wt 131.0 lb

## 2020-09-12 DIAGNOSIS — I872 Venous insufficiency (chronic) (peripheral): Secondary | ICD-10-CM

## 2020-09-12 DIAGNOSIS — Z0000 Encounter for general adult medical examination without abnormal findings: Secondary | ICD-10-CM

## 2020-09-12 DIAGNOSIS — L900 Lichen sclerosus et atrophicus: Secondary | ICD-10-CM | POA: Diagnosis not present

## 2020-09-12 DIAGNOSIS — M810 Age-related osteoporosis without current pathological fracture: Secondary | ICD-10-CM

## 2020-09-12 DIAGNOSIS — I10 Essential (primary) hypertension: Secondary | ICD-10-CM | POA: Diagnosis not present

## 2020-09-12 DIAGNOSIS — N3010 Interstitial cystitis (chronic) without hematuria: Secondary | ICD-10-CM

## 2020-09-12 MED ORDER — MOMETASONE FUROATE 0.1 % EX CREA: 1.0000 "application " | TOPICAL_CREAM | Freq: Every day | CUTANEOUS | 0 refills | Status: DC

## 2020-09-12 NOTE — Progress Notes (Signed)
Annual Wellness Visit     Patient: [name], Female    DOB: [birth year], 70 y.o.   MRN: [id number] Visit Date: 09/12/2020  Today's Provider: Maurine Minister Jevon Shells, PA-C   No chief complaint on file.  Subjective    [name] is a 70 y.o. female who presents today for her Annual Wellness Visit. She reports consuming a general diet.  She generally feels well. She reports sleeping well. She does not have additional problems to discuss today.   Past Medical History:  Diagnosis Date   Complication of anesthesia    GERD (gastroesophageal reflux disease)    Hyperlipidemia    Hypertension    IC (interstitial cystitis)    PONV (postoperative nausea and vomiting)    NO PROBLEM     4/18 WITH RETINAL SURGERY   Past Surgical History:  Procedure Laterality Date   BLADDER SUSPENSION     TRANSVAGINAL SLING   BUNIONECTOMY  12/02/2017   CATARACT EXTRACTION W/PHACO Right 09/29/2016   Procedure: CATARACT EXTRACTION PHACO AND INTRAOCULAR LENS PLACEMENT (IOC);  Surgeon: Galen Manila, MD;  Location: [facility] ORS;  Service: Ophthalmology;  Laterality: Right;  [place] 00:47 AP% 21.6 CDE 10.26 fluid pack lot # [id number]   CHOLECYSTECTOMY     EYE SURGERY     INSERTION OF MESH  2008   Trans vaginal mesh   LASER ABLATION Right 11/21/2015   [facility] Dr. Gilda Crease   Family History  Problem Relation Age of Onset   Cancer Father        Bladder   Bladder Cancer Father    Hypertension Father    Coronary artery disease Father        Stent Placed 1990   Liver disease Father    Congestive Heart Failure Mother    Healthy Sister    Hypertension Sister    Breast cancer Paternal Aunt    Healthy Son    Healthy Son    Social History   Tobacco Use   Smoking status: Former    Packs/day: 1.00    Years: 35.00    Pack years: 35.00    Types: Cigarettes    Quit date: 01/20/1996    Years since quitting: 24.6   Smokeless tobacco: Never  Vaping Use   Vaping Use: Never used  Substance  Use Topics   Alcohol use: No   Drug use: No      Medications: Outpatient Medications Prior to Visit  Medication Sig   amitriptyline (ELAVIL) 50 MG tablet Take 1 tablet (50 mg total) by mouth at bedtime.   Calcium Carbonate-Vitamin D 600-200 MG-UNIT CAPS Take 2 tablets by mouth daily.   Calcium Glycerophosphate (PRELIEF PO) Take 2 tablets by mouth daily. As needed   Cholecalciferol 125 MCG (5000 UT) TABS Take by mouth.   Flaxseed, Linseed, (FLAXSEED OIL PO) Take by mouth.   Lifitegrast (XIIDRA) 5 % SOLN Apply to eye.   mometasone (ELOCON) 0.1 % cream Apply 1 application topically daily.   MYRBETRIQ 50 MG TB24 tablet    pantoprazole (PROTONIX) 40 MG tablet Take 1 tablet (40 mg total) by mouth daily.   triamterene-hydrochlorothiazide (MAXZIDE-25) 37.5-25 MG tablet Take 1 tablet by mouth daily.   vitamin C (ASCORBIC ACID) 500 MG tablet Take 500 mg by mouth daily.   azelastine (ASTELIN) 0.1 % nasal spray Place 2 sprays into both nostrils 2 (two) times daily for 7 days. Use in each nostril as directed   nitrofurantoin, macrocrystal-monohydrate, (MACROBID) 100  MG capsule Take 1 capsule (100 mg total) by mouth 2 (two) times daily.   [DISCONTINUED] triamterene-hydrochlorothiazide (MAXZIDE-25) 37.5-25 MG tablet Take 1 tablet by mouth daily.   No facility-administered medications prior to visit.    Allergies  Allergen Reactions   Doxycycline Nausea Only   Penicillins Rash   Pneumococcal 13-Val Conj Vacc Rash    Localized swelling and erythema of upper extremity; patient refuses pneumococcal 23 and wants documented she will not take due to this reaction Localized swelling and erythema of upper extremity; patient refuses pneumococcal 23 and wants documented she will not take due to this reaction   Sulfa Antibiotics Rash    Patient Care Team: Reine JustBurnette, Jennifer M, PA-C as PCP - General (Family Medicine)  Review of Systems  Constitutional: Negative.   HENT: Negative.    Eyes: Negative.    Respiratory: Negative.    Cardiovascular: Negative.   Gastrointestinal: Negative.   Endocrine: Negative.   Genitourinary: Negative.   Musculoskeletal: Negative.   Skin: Negative.   Allergic/Immunologic: Negative.   Neurological: Negative.   Hematological: Negative.   Psychiatric/Behavioral: Negative.          Objective    Vitals: BP 135/74 (BP Location: Right Arm, Patient Position: Sitting, Cuff Size: Normal)   Pulse 81   Temp 97.8 F (36.6 C) (Oral)   Ht 5\' 6"  (1.676 m)   Wt 131 lb (59.4 kg)   SpO2 100%   BMI 21.14 kg/m     Physical Exam Constitutional:      Appearance: Normal appearance. She is normal weight.  HENT:     Head: Normocephalic and atraumatic.     Right Ear: Tympanic membrane, ear canal and external ear normal.     Left Ear: Tympanic membrane, ear canal and external ear normal.     Nose: Nose normal.     Mouth/Throat:     Mouth: Mucous membranes are moist.     Pharynx: Oropharynx is clear.  Eyes:     Extraocular Movements: Extraocular movements intact.     Conjunctiva/sclera: Conjunctivae normal.     Pupils: Pupils are equal, round, and reactive to light.  Cardiovascular:     Rate and Rhythm: Normal rate and regular rhythm.     Pulses: Normal pulses.     Heart sounds: Normal heart sounds.  Pulmonary:     Effort: Pulmonary effort is normal.     Breath sounds: Normal breath sounds.  Abdominal:     General: Abdomen is flat. Bowel sounds are normal.     Palpations: Abdomen is soft.  Musculoskeletal:        General: Normal range of motion.     Cervical back: Normal range of motion and neck supple.  Skin:    General: Skin is warm and dry.  Neurological:     General: No focal deficit present.     Mental Status: She is alert and oriented to person, place, and time. Mental status is at baseline.  Psychiatric:        Mood and Affect: Mood normal.        Behavior: Behavior normal.        Thought Content: Thought content normal.        Judgment:  Judgment normal.    Most recent functional status assessment: In your present state of health, do you have any difficulty performing the following activities: 09/12/2020  Hearing? N  Vision? N  Difficulty concentrating or making decisions? N  Walking or climbing stairs? N  Dressing or bathing? N  Doing errands, shopping? N  Some recent data might be hidden   Most recent fall risk assessment: Fall Risk  10/18/2019  Falls in the past year? 0  Number falls in past yr: 0  Injury with Fall? 0  Risk for fall due to : No Fall Risks  Follow up Falls evaluation completed    Most recent depression screenings: PHQ 2/9 Scores 09/12/2020 10/18/2019  PHQ - 2 Score 0 0  PHQ- 9 Score 0 0   Most recent cognitive screening: 6CIT Screen 06/03/2017  What Year? 0 points  What month? 0 points  What time? 0 points  Count back from 20 0 points  Months in reverse 0 points  Repeat phrase 0 points  Total Score 0   Most recent Audit-C alcohol use screening Alcohol Use Disorder Test (AUDIT) 09/12/2020  1. How often do you have a drink containing alcohol? 0  2. How many drinks containing alcohol do you have on a typical day when you are drinking? 0  3. How often do you have six or more drinks on one occasion? 0  AUDIT-C Score 0  Alcohol Brief Interventions/Follow-up -   A score of 3 or more in women, and 4 or more in men indicates increased risk for alcohol abuse, EXCEPT if all of the points are from question 1   No results found for any visits on 09/12/20.  Assessment & Plan     Annual wellness visit done today including the all of the following: Reviewed patient's Family Medical History Reviewed and updated list of patient's medical providers Assessment of cognitive impairment was done Assessed patient's functional ability Established a written schedule for health screening services Health Risk Assessent Completed and Reviewed  Exercise Activities and Dietary recommendations  Goals       Exercise 150 minutes per week (moderate activity)        Immunization History  Administered Date(s) Administered   H1N1 12/22/2007   Influenza Split 11/19/2005, 10/07/2009, 10/10/2010   Influenza,inj,Quad PF,6+ Mos 10/20/2014   Influenza-Unspecified 10/15/2015, 10/18/2017   Janssen (J&J) SARS-COV-2 Vaccination 06/02/2019   Pneumococcal Conjugate-13 06/03/2017   Tdap 10/10/2010   Zoster, Live 02/16/2015    Health Maintenance  Topic Date Due   INFLUENZA VACCINE  08/19/2020   COVID-19 Vaccine (2 - Booster for Janssen series) 09/28/2020 (Originally 07/28/2019)   Zoster Vaccines- Shingrix (1 of 2) 01/19/2021 (Originally 12/25/2001)   PNA vac Low Risk Adult (2 of 2 - PPSV23) 09/12/2021 (Originally 06/04/2018)   TETANUS/TDAP  10/09/2020   MAMMOGRAM  02/25/2021   DEXA SCAN  Completed   Hepatitis C Screening  Completed   HPV VACCINES  Aged Out   Fecal DNA (Cologuard)  Discontinued     Discussed health benefits of physical activity, and encouraged her to engage in regular exercise appropriate for her age and condition.    1. Medicare annual wellness visit, subsequent General health good. Immunizations up to date. Given health maintenance counseling.   2. Lichen sclerosus Need refill of Elocon. Follow up with GYN scheduled tomorro. - mometasone (ELOCON) 0.1 % cream; Apply 1 application topically daily.  Dispense: 45 g; Refill: 0  3. Chronic venous insufficiency No open sores or significant swelling issues. Recommend support hose. - CBC with Differential/Platelet - Comprehensive metabolic panel  4. Essential hypertension Tolerating Mazide 25 mg qd without side effects. Recheck labs. No side effects. - CBC with Differential/Platelet - Comprehensive metabolic panel - Lipid panel - TSH  5. Age-related  osteoporosis without current pathological fracture Taking calcium and Vitamin D supplement. Declines any prescription medications for osteoporosis. Prefers to exercise regularly and  use supplements. Check labs and follow up BMD. - Comprehensive metabolic panel - VITAMIN D 25 Hydroxy (Vit-D Deficiency, Fractures) - DG Bone Density  6. Interstitial cystitis Taking Elavil and Myrbetriq for bladder control symptoms. Followed by urologist regularly.   No follow-ups on file.     I, Welton Bord, PA-C, have reviewed all documentation for this visit. The documentation on 09/12/20 for the exam, diagnosis, procedures, and orders are all accurate and complete.    Dortha Kern, PA-C  [facility] [phone] (phone) [phone] (fax)  [facility]

## 2020-09-13 ENCOUNTER — Ambulatory Visit (INDEPENDENT_AMBULATORY_CARE_PROVIDER_SITE_OTHER): Payer: Medicare Other | Admitting: Obstetrics and Gynecology

## 2020-09-13 ENCOUNTER — Other Ambulatory Visit: Payer: Self-pay

## 2020-09-13 ENCOUNTER — Encounter: Payer: Self-pay | Admitting: Obstetrics and Gynecology

## 2020-09-13 VITALS — BP 110/70 | Ht 66.0 in | Wt 130.0 lb

## 2020-09-13 DIAGNOSIS — L900 Lichen sclerosus et atrophicus: Secondary | ICD-10-CM | POA: Diagnosis not present

## 2020-09-13 MED ORDER — CLOBETASOL PROPIONATE 0.05 % EX OINT: TOPICAL_OINTMENT | CUTANEOUS | 5 refills | Status: DC

## 2020-09-13 NOTE — Progress Notes (Signed)
Patient ID: [name], female   DOB: 07-09-1950, 70 y.o.   MRN: [id number]  Reason for Consult: Follow-up (LS flared up again)   Referred by Margaretann Loveless, P*  Subjective:     HPI:  [name] is a 70 y.o. female.  She is following up today.  I have seen her previously for lichen sclerosis which was managed with clobetasol successfully.  She reports that she was symptom-free for several years and discontinued clobetasol usage.  Recently she has felt the symptoms of lichen sclerosus returned.  She reports reports feeling everything sensation and dryness..  She has been using topical vagisil   Gynecological History  No LMP recorded. Patient is postmenopausal. She denies postmenopausal bleeding.  Past Medical History:  Diagnosis Date   Complication of anesthesia    GERD (gastroesophageal reflux disease)    Hyperlipidemia    Hypertension    IC (interstitial cystitis)    PONV (postoperative nausea and vomiting)    NO PROBLEM     4/18 WITH RETINAL SURGERY   Family History  Problem Relation Age of Onset   Congestive Heart Failure Mother    Cancer Father        Bladder   Bladder Cancer Father    Hypertension Father    Coronary artery disease Father        Stent Placed 1990   Liver disease Father    Hypertension Sister    Lung cancer Sister    Breast cancer Paternal Aunt    Healthy Son    Healthy Son    Past Surgical History:  Procedure Laterality Date   BLADDER SUSPENSION     TRANSVAGINAL SLING   BUNIONECTOMY  12/02/2017   CATARACT EXTRACTION W/PHACO Right 09/29/2016   Procedure: CATARACT EXTRACTION PHACO AND INTRAOCULAR LENS PLACEMENT (IOC);  Surgeon: Galen Manila, MD;  Location: [facility] ORS;  Service: Ophthalmology;  Laterality: Right;  [place] 00:47 AP% 21.6 CDE 10.26 fluid pack lot # [id number]   CHOLECYSTECTOMY     EYE SURGERY     INSERTION OF MESH  2008   Trans vaginal mesh   LASER ABLATION Right 11/21/2015   [facility] Dr. Lesly Dukes Social History:  Social History   Tobacco Use   Smoking status: Former    Packs/day: 1.00    Years: 35.00    Pack years: 35.00    Types: Cigarettes    Quit date: 01/20/1996    Years since quitting: 24.6   Smokeless tobacco: Never  Substance Use Topics   Alcohol use: No    Allergies  Allergen Reactions   Doxycycline Nausea Only   Penicillins Rash   Pneumococcal 13-Val Conj Vacc Rash    Localized swelling and erythema of upper extremity; patient refuses pneumococcal 23 and wants documented she will not take due to this reaction Localized swelling and erythema of upper extremity; patient refuses pneumococcal 23 and wants documented she will not take due to this reaction   Sulfa Antibiotics Rash    Current Outpatient Medications  Medication Sig Dispense Refill   amitriptyline (ELAVIL) 50 MG tablet Take 1 tablet (50 mg total) by mouth at bedtime. 90 tablet 0   Calcium Carbonate-Vitamin D 600-200 MG-UNIT CAPS Take 2 tablets by mouth daily.     Calcium Glycerophosphate (PRELIEF PO) Take 2 tablets by mouth daily. As needed     Cholecalciferol 125 MCG (5000 UT) TABS Take by mouth.     clobetasol ointment (TEMOVATE)  0.05 % Apply to affected area every night for 4 weeks, then every other day for 4 weeks and then twice a week for 4 weeks or until resolution. 30 g 5   Flaxseed, Linseed, (FLAXSEED OIL PO) Take by mouth.     Lifitegrast (XIIDRA) 5 % SOLN Apply to eye.     mometasone (ELOCON) 0.1 % cream Apply 1 application topically daily. 45 g 0   MYRBETRIQ 50 MG TB24 tablet      pantoprazole (PROTONIX) 40 MG tablet Take 1 tablet (40 mg total) by mouth daily. 90 tablet 0   triamterene-hydrochlorothiazide (MAXZIDE-25) 37.5-25 MG tablet Take 1 tablet by mouth daily. 90 tablet 0   vitamin C (ASCORBIC ACID) 500 MG tablet Take 500 mg by mouth daily.     azelastine (ASTELIN) 0.1 % nasal spray Place 2 sprays into both nostrils 2 (two) times daily for 7 days. Use in each nostril as  directed 30 mL 0   No current facility-administered medications for this visit.    REVIEW OF SYSTEMS      Objective:  Objective   Vitals:   09/13/20 1103  BP: 110/70  Weight: 130 lb (59 kg)  Height: 5\' 6"  (1.676 m)   Body mass index is 20.98 kg/m.  Physical Exam Vitals and nursing note reviewed. Exam conducted with a chaperone present.  Constitutional:      Appearance: Normal appearance. She is well-developed.  HENT:     Head: Normocephalic and atraumatic.  Eyes:     Extraocular Movements: Extraocular movements intact.     Pupils: Pupils are equal, round, and reactive to light.  Cardiovascular:     Rate and Rhythm: Normal rate and regular rhythm.  Pulmonary:     Effort: Pulmonary effort is normal. No respiratory distress.     Breath sounds: Normal breath sounds.  Abdominal:     General: Abdomen is flat.     Palpations: Abdomen is soft.  Genitourinary:    Comments: External: Normal appearing vulva. No lesions noted.  Speculum examination: Normal appearing cervix. No blood in the vaginal vault. No discharge.  Bimanual examination: Uterus midline, non-tender, normal in size, shape and contour.  No CMT. No adnexal masses. No adnexal tenderness. Pelvis not fixed.  Breast exam: breast equal without skin changes, nipple discharge, breast lump or enlarged lymph nodes Musculoskeletal:        General: No signs of injury.  Skin:    General: Skin is warm and dry.  Neurological:     Mental Status: She is alert and oriented to person, place, and time.  Psychiatric:        Behavior: Behavior normal.        Thought Content: Thought content normal.        Judgment: Judgment normal.    Assessment/Plan:     70 year old with recurrence of lichen sclerosis Will restart clobetasol therapy.  Discussed application of 1-2 times a week.  No significant changes noted on exam today.  Recommended vulvar inspection of yearly.  Discussed importance of once week or once every other week  maintenance therapy with clobetasol once her symptoms have resolved to prevent recurrence. Provide the patient with a handout regarding lichen sclerosus.  More than 20 minutes were spent face to face with the patient in the room, reviewing the medical record, labs and images, and coordinating care for the patient. The plan of management was discussed in detail and counseling was provided.     73 MD [facility], [facility] 09/13/2020 11:40 AM

## 2020-09-17 DIAGNOSIS — I872 Venous insufficiency (chronic) (peripheral): Secondary | ICD-10-CM | POA: Diagnosis not present

## 2020-09-17 DIAGNOSIS — I10 Essential (primary) hypertension: Secondary | ICD-10-CM | POA: Diagnosis not present

## 2020-09-17 DIAGNOSIS — M810 Age-related osteoporosis without current pathological fracture: Secondary | ICD-10-CM | POA: Diagnosis not present

## 2020-09-18 LAB — CBC WITH DIFFERENTIAL/PLATELET
Basophils Absolute: 0.1 10*3/uL (ref 0.0–0.2)
Basos: 1 %
EOS (ABSOLUTE): 0.1 10*3/uL (ref 0.0–0.4)
Eos: 3 %
Hematocrit: 42.3 % (ref 34.0–46.6)
Hemoglobin: 14.6 g/dL (ref 11.1–15.9)
Immature Grans (Abs): 0 10*3/uL (ref 0.0–0.1)
Immature Granulocytes: 0 %
Lymphocytes Absolute: 1.6 10*3/uL (ref 0.7–3.1)
Lymphs: 32 %
MCH: 31.2 pg (ref 26.6–33.0)
MCHC: 34.5 g/dL (ref 31.5–35.7)
MCV: 90 fL (ref 79–97)
Monocytes Absolute: 0.4 10*3/uL (ref 0.1–0.9)
Monocytes: 7 %
Neutrophils Absolute: 2.8 10*3/uL (ref 1.4–7.0)
Neutrophils: 57 %
Platelets: 249 10*3/uL (ref 150–450)
RBC: 4.68 x10E6/uL (ref 3.77–5.28)
RDW: 11.9 % (ref 11.7–15.4)
WBC: 4.9 10*3/uL (ref 3.4–10.8)

## 2020-09-18 LAB — COMPREHENSIVE METABOLIC PANEL
ALT: 15 IU/L (ref 0–32)
AST: 22 IU/L (ref 0–40)
Albumin/Globulin Ratio: 1.9 (ref 1.2–2.2)
Albumin: 4.5 g/dL (ref 3.8–4.8)
Alkaline Phosphatase: 79 IU/L (ref 44–121)
BUN/Creatinine Ratio: 20 (ref 12–28)
BUN: 15 mg/dL (ref 8–27)
Bilirubin Total: 0.4 mg/dL (ref 0.0–1.2)
CO2: 27 mmol/L (ref 20–29)
Calcium: 9.7 mg/dL (ref 8.7–10.3)
Chloride: 98 mmol/L (ref 96–106)
Creatinine, Ser: 0.75 mg/dL (ref 0.57–1.00)
Globulin, Total: 2.4 g/dL (ref 1.5–4.5)
Glucose: 91 mg/dL (ref 65–99)
Potassium: 4.3 mmol/L (ref 3.5–5.2)
Sodium: 138 mmol/L (ref 134–144)
Total Protein: 6.9 g/dL (ref 6.0–8.5)
eGFR: 87 mL/min/{1.73_m2} (ref >59)

## 2020-09-18 LAB — LIPID PANEL
Chol/HDL Ratio: 2.7 ratio (ref 0.0–4.4)
Cholesterol, Total: 194 mg/dL (ref 100–199)
HDL: 72 mg/dL (ref >39)
LDL Chol Calc (NIH): 111 mg/dL — ABNORMAL HIGH (ref 0–99)
Triglycerides: 58 mg/dL (ref 0–149)
VLDL Cholesterol Cal: 11 mg/dL (ref 5–40)

## 2020-09-18 LAB — VITAMIN D 25 HYDROXY (VIT D DEFICIENCY, FRACTURES): Vit D, 25-Hydroxy: 76.5 ng/mL (ref 30.0–100.0)

## 2020-09-18 LAB — TSH: TSH: 2.52 u[IU]/mL (ref 0.450–4.500)

## 2020-09-20 ENCOUNTER — Encounter: Payer: Self-pay | Admitting: *Deleted

## 2020-09-24 DIAGNOSIS — H9011 Conductive hearing loss, unilateral, right ear, with unrestricted hearing on the contralateral side: Secondary | ICD-10-CM | POA: Diagnosis not present

## 2020-09-24 DIAGNOSIS — H8091 Unspecified otosclerosis, right ear: Secondary | ICD-10-CM | POA: Diagnosis not present

## 2020-09-30 ENCOUNTER — Other Ambulatory Visit: Payer: Self-pay

## 2020-09-30 ENCOUNTER — Ambulatory Visit
Admission: RE | Admit: 2020-09-30 | Discharge: 2020-09-30 | Disposition: A | Discharge status: Home / Self Care | Payer: Medicare Other | Source: Ambulatory Visit | Attending: Family Medicine | Admitting: Family Medicine

## 2020-09-30 DIAGNOSIS — M85851 Other specified disorders of bone density and structure, right thigh: Secondary | ICD-10-CM | POA: Diagnosis not present

## 2020-09-30 DIAGNOSIS — Z780 Asymptomatic menopausal state: Secondary | ICD-10-CM | POA: Diagnosis not present

## 2020-09-30 DIAGNOSIS — M810 Age-related osteoporosis without current pathological fracture: Secondary | ICD-10-CM | POA: Diagnosis not present

## 2020-10-25 ENCOUNTER — Telehealth: Payer: Self-pay | Admitting: Physician Assistant

## 2020-10-25 DIAGNOSIS — K219 Gastro-esophageal reflux disease without esophagitis: Secondary | ICD-10-CM

## 2020-10-25 DIAGNOSIS — N3010 Interstitial cystitis (chronic) without hematuria: Secondary | ICD-10-CM

## 2020-10-25 MED ORDER — PANTOPRAZOLE SODIUM 40 MG PO TBEC: 40.0000 mg | DELAYED_RELEASE_TABLET | Freq: Every day | ORAL | 1 refills | Status: DC

## 2020-10-25 MED ORDER — AMITRIPTYLINE HCL 50 MG PO TABS: 50.0000 mg | ORAL_TABLET | Freq: Every day | ORAL | 1 refills | Status: DC

## 2020-10-25 NOTE — Telephone Encounter (Signed)
[facility] faxed refill request for the following medications:  amitriptyline (ELAVIL) 50 MG tablet   pantoprazole (PROTONIX) 40 MG tablet    Please advise.

## 2020-10-29 DIAGNOSIS — Z23 Encounter for immunization: Secondary | ICD-10-CM | POA: Diagnosis not present

## 2021-01-21 ENCOUNTER — Other Ambulatory Visit: Payer: Self-pay | Admitting: Physician Assistant

## 2021-01-21 ENCOUNTER — Telehealth: Payer: Self-pay

## 2021-01-21 DIAGNOSIS — Z1231 Encounter for screening mammogram for malignant neoplasm of breast: Secondary | ICD-10-CM

## 2021-01-21 NOTE — Telephone Encounter (Signed)
Copied from CRM [id number]. Topic: General - Other >> Jan 21, 2021 11:13 AM Wyonia Hough E wrote: Reason for CRM: pt went to schedule mammogram with [facility] and they would not schedule pt without order from her assigned provider/ please advise pt of who her provider will be

## 2021-02-05 ENCOUNTER — Ambulatory Visit: Payer: Medicare Other | Admitting: Physician Assistant

## 2021-02-12 ENCOUNTER — Ambulatory Visit: Payer: Medicare Other | Admitting: Physician Assistant

## 2021-02-14 ENCOUNTER — Other Ambulatory Visit: Payer: Self-pay | Admitting: Adult Health

## 2021-02-14 DIAGNOSIS — I10 Essential (primary) hypertension: Secondary | ICD-10-CM

## 2021-02-28 ENCOUNTER — Ambulatory Visit
Admission: RE | Admit: 2021-02-28 | Discharge: 2021-02-28 | Disposition: A | Discharge status: Home / Self Care | Payer: Medicare Other | Source: Ambulatory Visit | Attending: Physician Assistant | Admitting: Physician Assistant

## 2021-02-28 ENCOUNTER — Other Ambulatory Visit: Payer: Self-pay

## 2021-02-28 DIAGNOSIS — Z1231 Encounter for screening mammogram for malignant neoplasm of breast: Secondary | ICD-10-CM | POA: Insufficient documentation

## 2021-03-26 DIAGNOSIS — H9011 Conductive hearing loss, unilateral, right ear, with unrestricted hearing on the contralateral side: Secondary | ICD-10-CM | POA: Diagnosis not present

## 2021-03-26 DIAGNOSIS — H8090 Unspecified otosclerosis, unspecified ear: Secondary | ICD-10-CM | POA: Diagnosis not present

## 2021-05-19 ENCOUNTER — Other Ambulatory Visit: Payer: Self-pay | Admitting: Family Medicine

## 2021-05-19 ENCOUNTER — Other Ambulatory Visit: Payer: Self-pay | Admitting: Physician Assistant

## 2021-05-19 DIAGNOSIS — N3010 Interstitial cystitis (chronic) without hematuria: Secondary | ICD-10-CM

## 2021-05-19 DIAGNOSIS — I10 Essential (primary) hypertension: Secondary | ICD-10-CM

## 2021-05-19 DIAGNOSIS — K219 Gastro-esophageal reflux disease without esophagitis: Secondary | ICD-10-CM

## 2021-06-04 DIAGNOSIS — H35371 Puckering of macula, right eye: Secondary | ICD-10-CM | POA: Diagnosis not present

## 2021-06-09 DIAGNOSIS — M2012 Hallux valgus (acquired), left foot: Secondary | ICD-10-CM | POA: Diagnosis not present

## 2021-06-09 DIAGNOSIS — M84375A Stress fracture, left foot, initial encounter for fracture: Secondary | ICD-10-CM | POA: Diagnosis not present

## 2021-06-09 DIAGNOSIS — M79672 Pain in left foot: Secondary | ICD-10-CM | POA: Diagnosis not present

## 2021-06-09 DIAGNOSIS — M216X2 Other acquired deformities of left foot: Secondary | ICD-10-CM | POA: Diagnosis not present

## 2021-06-09 DIAGNOSIS — S93145D Subluxation of metatarsophalangeal joint of left lesser toe(s), subsequent encounter: Secondary | ICD-10-CM | POA: Diagnosis not present

## 2021-06-20 ENCOUNTER — Other Ambulatory Visit: Payer: Self-pay | Admitting: Physician Assistant

## 2021-06-20 DIAGNOSIS — I10 Essential (primary) hypertension: Secondary | ICD-10-CM

## 2021-06-24 DIAGNOSIS — R3915 Urgency of urination: Secondary | ICD-10-CM | POA: Diagnosis not present

## 2021-06-24 DIAGNOSIS — R300 Dysuria: Secondary | ICD-10-CM | POA: Diagnosis not present

## 2021-06-24 DIAGNOSIS — R102 Pelvic and perineal pain: Secondary | ICD-10-CM | POA: Diagnosis not present

## 2021-07-02 ENCOUNTER — Encounter: Payer: Self-pay | Admitting: Physician Assistant

## 2021-07-02 ENCOUNTER — Ambulatory Visit (INDEPENDENT_AMBULATORY_CARE_PROVIDER_SITE_OTHER): Payer: Medicare Other | Admitting: Physician Assistant

## 2021-07-02 VITALS — BP 130/80 | HR 85 | Ht 64.5 in | Wt 127.0 lb

## 2021-07-02 DIAGNOSIS — K219 Gastro-esophageal reflux disease without esophagitis: Secondary | ICD-10-CM

## 2021-07-02 DIAGNOSIS — I10 Essential (primary) hypertension: Secondary | ICD-10-CM

## 2021-07-02 DIAGNOSIS — N3010 Interstitial cystitis (chronic) without hematuria: Secondary | ICD-10-CM | POA: Diagnosis not present

## 2021-07-02 DIAGNOSIS — M810 Age-related osteoporosis without current pathological fracture: Secondary | ICD-10-CM | POA: Diagnosis not present

## 2021-07-02 MED ORDER — AMITRIPTYLINE HCL 50 MG PO TABS: 50.0000 mg | ORAL_TABLET | Freq: Every day | ORAL | 1 refills | Status: DC

## 2021-07-02 MED ORDER — PANTOPRAZOLE SODIUM 40 MG PO TBEC: 40.0000 mg | DELAYED_RELEASE_TABLET | Freq: Every day | ORAL | 1 refills | Status: DC

## 2021-07-02 MED ORDER — TRIAMTERENE-HCTZ 37.5-25 MG PO TABS: 1.0000 | ORAL_TABLET | Freq: Every day | ORAL | 1 refills | Status: DC

## 2021-07-02 NOTE — Progress Notes (Signed)
I,Sha'taria Tyson,acting as a Education administrator for [employer], PA-C.,have documented all relevant documentation on the behalf of Laura Kirschner, PA-C,as directed by  Laura Kirschner, PA-C while in the presence of Laura Kirschner, PA-C.   Established patient visit   Patient: [name]   DOB: 02-Mar-1950   71 y.o. Female  MRN: [id number] Visit Date: 07/02/2021  Today's healthcare provider: [name], PA-C   Cc. HTN f/u  Subjective    HPI  Brittainy has questions regarding her last bone density report , she was unclear of the results.   Hypertension, follow-up  BP Readings from Last 3 Encounters:  07/02/21 130/80  09/13/20 110/70  09/12/20 135/74   Wt Readings from Last 3 Encounters:  07/02/21 127 lb (57.6 kg)  09/13/20 130 lb (59 kg)  09/12/20 131 lb (59.4 kg)     She was last seen for hypertension 10 months ago. (09/12/20) BP at that visit was 135/74. Management since that visit includes tolerating Mazide 25 mg qd.  She reports excellent compliance with treatment. She is not having side effects.  She is following a Regular diet. She is exercising. She does not smoke.  Use of agents associated with hypertension: none.   Outside blood pressures are not being checked. Symptoms: No chest pain No chest pressure  No palpitations No syncope  No dyspnea No orthopnea  No paroxysmal nocturnal dyspnea No lower extremity edema   Pertinent labs Lab Results  Component Value Date   CHOL 194 09/17/2020   HDL 72 09/17/2020   LDLCALC 111 (H) 09/17/2020   TRIG 58 09/17/2020   CHOLHDL 2.7 09/17/2020   Lab Results  Component Value Date   NA 138 09/17/2020   K 4.3 09/17/2020   CREATININE 0.75 09/17/2020   EGFR 87 09/17/2020   GLUCOSE 91 09/17/2020   TSH 2.520 09/17/2020     The 10-year ASCVD risk score (Arnett DK, et al., 2019) is: 10.9%  ---------------------------------------------------------------------------------------------------   Medications: Outpatient  Medications Prior to Visit  Medication Sig   azelastine (ASTELIN) 0.1 % nasal spray Place 2 sprays into both nostrils 2 (two) times daily for 7 days. Use in each nostril as directed   Calcium Carbonate-Vitamin D 600-200 MG-UNIT CAPS Take 2 tablets by mouth daily.   Calcium Glycerophosphate (PRELIEF PO) Take 2 tablets by mouth daily. As needed   Cholecalciferol 125 MCG (5000 UT) TABS Take by mouth.   clobetasol ointment (TEMOVATE) 0.05 % Apply to affected area every night for 4 weeks, then every other day for 4 weeks and then twice a week for 4 weeks or until resolution.   Flaxseed, Linseed, (FLAXSEED OIL PO) Take by mouth.   Lifitegrast (XIIDRA) 5 % SOLN Apply to eye.   mometasone (ELOCON) 0.1 % cream Apply 1 application topically daily.   MYRBETRIQ 50 MG TB24 tablet    vitamin C (ASCORBIC ACID) 500 MG tablet Take 500 mg by mouth daily.   [DISCONTINUED] amitriptyline (ELAVIL) 50 MG tablet Take 1 tablet (50 mg total) by mouth at bedtime.   [DISCONTINUED] pantoprazole (PROTONIX) 40 MG tablet Take 1 tablet (40 mg total) by mouth daily.   [DISCONTINUED] triamterene-hydrochlorothiazide (MAXZIDE-25) 37.5-25 MG tablet Take 1 tablet by mouth daily. Please make an appointment in the office for future refills   No facility-administered medications prior to visit.    Review of Systems  Constitutional:  Negative for fatigue and fever.  Respiratory:  Negative for cough and shortness of breath.   Cardiovascular:  Negative for  chest pain and leg swelling.  Gastrointestinal:  Negative for abdominal pain.  Neurological:  Negative for dizziness and headaches.       Objective    Blood pressure 130/80, pulse 85, height 5' 4.5" (1.638 m), weight 127 lb (57.6 kg), SpO2 100 %.   Physical Exam Constitutional:      General: She is awake.     Appearance: She is well-developed.  HENT:     Head: Normocephalic.  Eyes:     Conjunctiva/sclera: Conjunctivae normal.  Cardiovascular:     Rate and Rhythm:  Normal rate and regular rhythm.     Heart sounds: Normal heart sounds.  Pulmonary:     Effort: Pulmonary effort is normal.     Breath sounds: Normal breath sounds.  Skin:    General: Skin is warm.  Neurological:     Mental Status: She is alert and oriented to person, place, and time.  Psychiatric:        Attention and Perception: Attention normal.        Mood and Affect: Mood normal.        Speech: Speech normal.        Behavior: Behavior is cooperative.      No results found for any visits on 07/02/21.  Assessment & Plan     Problem List Items Addressed This Visit       Cardiovascular and Mediastinum   Essential hypertension - Primary    Controlled on current medication, continue Ordered cmp F/u 6 mo      Relevant Medications   triamterene-hydrochlorothiazide (MAXZIDE-25) 37.5-25 MG tablet   Other Relevant Orders   Comprehensive Metabolic Panel (CMET)     Digestive   GERD (gastroesophageal reflux disease)    Stable on current medication, continue Refilled meds Ordered cbc       Relevant Medications   pantoprazole (PROTONIX) 40 MG tablet   Other Relevant Orders   CBC w/Diff/Platelet     Musculoskeletal and Integument   Age-related osteoporosis without current pathological fracture    Pt report hx of osteoporosis related medication d/c d/t side effects. Unsure which. Discussed that 9/22 dexa scan was osteoporotic but stable from 2019.  Should continue w/ 1200 mg of calcium per day and 2,000 iu vit d Will recheck in 9/24, if there is a progression can consider ref to endo and reclast/prolia Pt aware of current fracture risk        Genitourinary   Interstitial cystitis    Stable on current medication Refilled       Relevant Medications   amitriptyline (ELAVIL) 50 MG tablet     Return in about 3 months (around 10/02/2021) for annual wellness.      I, Laura Kirschner, PA-C have reviewed all documentation for this visit. The documentation on  07/02/2021  for the exam, diagnosis, procedures, and orders are all accurate and complete. Laura Kirschner, PA-C [facility] [address] [place], [place], [address] Office: [phone] Fax: [place]

## 2021-07-02 NOTE — Assessment & Plan Note (Addendum)
Pt report hx of osteoporosis related medication d/c d/t side effects. Unsure which. Discussed that 9/22 dexa scan was osteoporotic but stable from 2019.  Should continue w/ 1200 mg of calcium per day and 2,000 iu vit d Will recheck in 9/24, if there is a progression can consider ref to endo and reclast/prolia Pt aware of current fracture risk

## 2021-07-02 NOTE — Assessment & Plan Note (Signed)
Stable on current medication Refilled

## 2021-07-02 NOTE — Assessment & Plan Note (Addendum)
Controlled on current medication, continue Ordered cmp F/u 6 mo

## 2021-07-02 NOTE — Assessment & Plan Note (Signed)
Stable on current medication, continue Refilled meds Ordered cbc

## 2021-07-03 LAB — COMPREHENSIVE METABOLIC PANEL
ALT: 15 IU/L (ref 0–32)
AST: 21 IU/L (ref 0–40)
Albumin/Globulin Ratio: 2.3 — ABNORMAL HIGH (ref 1.2–2.2)
Albumin: 4.8 g/dL (ref 3.8–4.8)
Alkaline Phosphatase: 79 IU/L (ref 44–121)
BUN/Creatinine Ratio: 24 (ref 12–28)
BUN: 17 mg/dL (ref 8–27)
Bilirubin Total: 0.3 mg/dL (ref 0.0–1.2)
CO2: 26 mmol/L (ref 20–29)
Calcium: 9.8 mg/dL (ref 8.7–10.3)
Chloride: 95 mmol/L — ABNORMAL LOW (ref 96–106)
Creatinine, Ser: 0.71 mg/dL (ref 0.57–1.00)
Globulin, Total: 2.1 g/dL (ref 1.5–4.5)
Glucose: 86 mg/dL (ref 70–99)
Potassium: 4.6 mmol/L (ref 3.5–5.2)
Sodium: 134 mmol/L (ref 134–144)
Total Protein: 6.9 g/dL (ref 6.0–8.5)
eGFR: 92 mL/min/{1.73_m2} (ref >59)

## 2021-07-03 LAB — CBC WITH DIFFERENTIAL/PLATELET
Basophils Absolute: 0 10*3/uL (ref 0.0–0.2)
Basos: 1 %
EOS (ABSOLUTE): 0.2 10*3/uL (ref 0.0–0.4)
Eos: 3 %
Hematocrit: 41.8 % (ref 34.0–46.6)
Hemoglobin: 14.2 g/dL (ref 11.1–15.9)
Immature Grans (Abs): 0 10*3/uL (ref 0.0–0.1)
Immature Granulocytes: 0 %
Lymphocytes Absolute: 1.5 10*3/uL (ref 0.7–3.1)
Lymphs: 24 %
MCH: 30.2 pg (ref 26.6–33.0)
MCHC: 34 g/dL (ref 31.5–35.7)
MCV: 89 fL (ref 79–97)
Monocytes Absolute: 0.5 10*3/uL (ref 0.1–0.9)
Monocytes: 8 %
Neutrophils Absolute: 3.9 10*3/uL (ref 1.4–7.0)
Neutrophils: 64 %
Platelets: 277 10*3/uL (ref 150–450)
RBC: 4.7 x10E6/uL (ref 3.77–5.28)
RDW: 12 % (ref 11.7–15.4)
WBC: 6 10*3/uL (ref 3.4–10.8)

## 2021-07-08 DIAGNOSIS — M2012 Hallux valgus (acquired), left foot: Secondary | ICD-10-CM | POA: Diagnosis not present

## 2021-07-08 DIAGNOSIS — S93145D Subluxation of metatarsophalangeal joint of left lesser toe(s), subsequent encounter: Secondary | ICD-10-CM | POA: Diagnosis not present

## 2021-07-08 DIAGNOSIS — M216X2 Other acquired deformities of left foot: Secondary | ICD-10-CM | POA: Diagnosis not present

## 2021-07-08 DIAGNOSIS — M84375G Stress fracture, left foot, subsequent encounter for fracture with delayed healing: Secondary | ICD-10-CM | POA: Diagnosis not present

## 2021-08-05 DIAGNOSIS — S93145D Subluxation of metatarsophalangeal joint of left lesser toe(s), subsequent encounter: Secondary | ICD-10-CM | POA: Diagnosis not present

## 2021-08-05 DIAGNOSIS — M79672 Pain in left foot: Secondary | ICD-10-CM | POA: Diagnosis not present

## 2021-08-05 DIAGNOSIS — M84375G Stress fracture, left foot, subsequent encounter for fracture with delayed healing: Secondary | ICD-10-CM | POA: Diagnosis not present

## 2021-08-05 DIAGNOSIS — M2012 Hallux valgus (acquired), left foot: Secondary | ICD-10-CM | POA: Diagnosis not present

## 2021-08-05 DIAGNOSIS — M216X2 Other acquired deformities of left foot: Secondary | ICD-10-CM | POA: Diagnosis not present

## 2021-08-06 ENCOUNTER — Other Ambulatory Visit: Payer: Self-pay | Admitting: Medical

## 2021-08-06 DIAGNOSIS — S93145D Subluxation of metatarsophalangeal joint of left lesser toe(s), subsequent encounter: Secondary | ICD-10-CM

## 2021-08-06 DIAGNOSIS — M2012 Hallux valgus (acquired), left foot: Secondary | ICD-10-CM

## 2021-08-06 DIAGNOSIS — M84375G Stress fracture, left foot, subsequent encounter for fracture with delayed healing: Secondary | ICD-10-CM

## 2021-08-06 DIAGNOSIS — M79672 Pain in left foot: Secondary | ICD-10-CM

## 2021-08-06 DIAGNOSIS — M216X2 Other acquired deformities of left foot: Secondary | ICD-10-CM

## 2021-08-13 ENCOUNTER — Ambulatory Visit
Admission: RE | Admit: 2021-08-13 | Discharge: 2021-08-13 | Disposition: A | Discharge status: Home / Self Care | Payer: Medicare Other | Source: Ambulatory Visit | Attending: Medical | Admitting: Medical

## 2021-08-13 DIAGNOSIS — M216X2 Other acquired deformities of left foot: Secondary | ICD-10-CM

## 2021-08-13 DIAGNOSIS — M84375G Stress fracture, left foot, subsequent encounter for fracture with delayed healing: Secondary | ICD-10-CM

## 2021-08-13 DIAGNOSIS — S93145D Subluxation of metatarsophalangeal joint of left lesser toe(s), subsequent encounter: Secondary | ICD-10-CM

## 2021-08-13 DIAGNOSIS — M79672 Pain in left foot: Secondary | ICD-10-CM

## 2021-08-13 DIAGNOSIS — M19072 Primary osteoarthritis, left ankle and foot: Secondary | ICD-10-CM | POA: Diagnosis not present

## 2021-08-13 DIAGNOSIS — Z9889 Other specified postprocedural states: Secondary | ICD-10-CM | POA: Diagnosis not present

## 2021-08-13 DIAGNOSIS — M2012 Hallux valgus (acquired), left foot: Secondary | ICD-10-CM | POA: Diagnosis not present

## 2021-08-13 DIAGNOSIS — M25475 Effusion, left foot: Secondary | ICD-10-CM | POA: Diagnosis not present

## 2021-08-25 DIAGNOSIS — M2012 Hallux valgus (acquired), left foot: Secondary | ICD-10-CM | POA: Diagnosis not present

## 2021-08-25 DIAGNOSIS — M216X2 Other acquired deformities of left foot: Secondary | ICD-10-CM | POA: Diagnosis not present

## 2021-08-25 DIAGNOSIS — M79672 Pain in left foot: Secondary | ICD-10-CM | POA: Diagnosis not present

## 2021-08-25 DIAGNOSIS — M84375G Stress fracture, left foot, subsequent encounter for fracture with delayed healing: Secondary | ICD-10-CM | POA: Diagnosis not present

## 2021-09-23 ENCOUNTER — Ambulatory Visit (INDEPENDENT_AMBULATORY_CARE_PROVIDER_SITE_OTHER): Payer: Medicare Other

## 2021-09-23 VITALS — BP 130/70 | Ht 64.5 in | Wt 125.5 lb

## 2021-09-23 DIAGNOSIS — Z0000 Encounter for general adult medical examination without abnormal findings: Secondary | ICD-10-CM | POA: Diagnosis not present

## 2021-09-23 NOTE — Progress Notes (Signed)
Subjective:   [name] is a 71 y.o. female who presents for Medicare Annual (Subsequent) preventive examination.  Review of Systems     Cardiac Risk Factors include: advanced age (>39men, >38 women)     Objective:    Today's Vitals   09/23/21 0818  BP: 130/70  Weight: 125 lb 8 oz (56.9 kg)  Height: 5' 4.5" (1.638 m)   Body mass index is 21.21 kg/m.     09/23/2021    8:25 AM 12/23/2015    9:02 AM  Advanced Directives  Does Patient Have a Medical Advance Directive? No Yes  Type of Special educational needs teacher of [place];Living will  Would patient like information on creating a medical advance directive? No - Patient declined     Current Medications (verified) Outpatient Encounter Medications as of 09/23/2021  Medication Sig   amitriptyline (ELAVIL) 50 MG tablet Take 1 tablet (50 mg total) by mouth at bedtime.   Calcium Carbonate-Vitamin D 600-200 MG-UNIT CAPS Take 2 tablets by mouth daily.   Calcium Glycerophosphate (PRELIEF PO) Take 2 tablets by mouth daily. As needed   Cholecalciferol 125 MCG (5000 UT) TABS Take by mouth.   clobetasol ointment (TEMOVATE) 0.05 % Apply to affected area every night for 4 weeks, then every other day for 4 weeks and then twice a week for 4 weeks or until resolution.   Flaxseed, Linseed, (FLAXSEED OIL PO) Take by mouth.   Lifitegrast (XIIDRA) 5 % SOLN Apply to eye.   MYRBETRIQ 50 MG TB24 tablet    pantoprazole (PROTONIX) 40 MG tablet Take 1 tablet (40 mg total) by mouth daily.   triamterene-hydrochlorothiazide (MAXZIDE-25) 37.5-25 MG tablet Take 1 tablet by mouth daily. Please make an appointment in the office for future refills   vitamin C (ASCORBIC ACID) 500 MG tablet Take 500 mg by mouth daily.   azelastine (ASTELIN) 0.1 % nasal spray Place 2 sprays into both nostrils 2 (two) times daily for 7 days. Use in each nostril as directed   mometasone (ELOCON) 0.1 % cream Apply 1 application topically daily.   No facility-administered  encounter medications on file as of 09/23/2021.    Allergies (verified) Doxycycline, Penicillins, Pneumococcal 13-val conj vacc, and Sulfa antibiotics   History: Past Medical History:  Diagnosis Date   Complication of anesthesia    GERD (gastroesophageal reflux disease)    Hyperlipidemia    Hypertension    IC (interstitial cystitis)    PONV (postoperative nausea and vomiting)    NO PROBLEM     4/18 WITH RETINAL SURGERY   Past Surgical History:  Procedure Laterality Date   BLADDER SUSPENSION     TRANSVAGINAL SLING   BUNIONECTOMY  12/02/2017   CATARACT EXTRACTION W/PHACO Right 09/29/2016   Procedure: CATARACT EXTRACTION PHACO AND INTRAOCULAR LENS PLACEMENT (IOC);  Surgeon: Galen Manila, MD;  Location: [facility] ORS;  Service: Ophthalmology;  Laterality: Right;  [place] 00:47 AP% 21.6 CDE 10.26 fluid pack lot # [id number]   CHOLECYSTECTOMY     EYE SURGERY     INSERTION OF MESH  2008   Trans vaginal mesh   LASER ABLATION Right 11/21/2015   [facility] Dr. Gilda Crease   Family History  Problem Relation Age of Onset   Congestive Heart Failure Mother    Cancer Father        Bladder   Bladder Cancer Father    Hypertension Father    Coronary artery disease Father        Stent  Placed 1990   Liver disease Father    Hypertension Sister    Lung cancer Sister    Breast cancer Paternal Aunt    Healthy Son    Healthy Son    Social History   Socioeconomic History   Marital status: Married    Spouse name: Not on file   Number of children: 2   Years of education: H/S   Highest education level: Not on file  Occupational History   Occupation: Full-time  Tobacco Use   Smoking status: Former    Packs/day: 1.00    Years: 35.00    Total pack years: 35.00    Types: Cigarettes    Quit date: 01/20/1996    Years since quitting: 25.6   Smokeless tobacco: Never  Vaping Use   Vaping Use: Never used  Substance and Sexual Activity   Alcohol use: No   Drug use: No   Sexual  activity: Not Currently    Birth control/protection: Post-menopausal  Other Topics Concern   Not on file  Social History Narrative   Not on file   Social Determinants of Health   Financial Resource Strain: Low Risk  (09/23/2021)   Overall Financial Resource Strain (CARDIA)    Difficulty of Paying Living Expenses: Not hard at all  Food Insecurity: No Food Insecurity (09/23/2021)   Hunger Vital Sign    Worried About Running Out of Food in the Last Year: Never true    Ran Out of Food in the Last Year: Never true  Transportation Needs: No Transportation Needs (09/23/2021)   PRAPARE - Administrator, Civil Service (Medical): No    Lack of Transportation (Non-Medical): No  Physical Activity: Sufficiently Active (09/23/2021)   Exercise Vital Sign    Days of Exercise per Week: 6 days    Minutes of Exercise per Session: 60 min  Stress: No Stress Concern Present (09/23/2021)   [employer] of Occupational Health - Occupational Stress Questionnaire    Feeling of Stress : Not at all  Social Connections: Moderately Integrated (09/23/2021)   Social Connection and Isolation Panel [NHANES]    Frequency of Communication with Friends and Family: More than three times a week    Frequency of Social Gatherings with Friends and Family: More than three times a week    Attends Religious Services: More than 4 times per year    Active Member of [employer] or Organizations: No    Attends Engineer, structural: Never    Marital Status: Married    Tobacco Counseling Counseling given: Not Answered   Clinical Intake:  Pre-visit preparation completed: Yes  Pain : No/denies pain     Nutritional Risks: None Diabetes: No  How often do you need to have someone help you when you read instructions, pamphlets, or other written materials from your doctor or pharmacy?: 1 - Never  Diabetic?no  Interpreter Needed?: No  Information entered by :: Kennedy Bucker, LPN   Activities of Daily  Living    09/23/2021    8:26 AM 09/19/2021   11:26 AM  In your present state of health, do you have any difficulty performing the following activities:  Hearing? 0 0  Vision? 0 0  Difficulty concentrating or making decisions? 0 0  Walking or climbing stairs? 0 0  Dressing or bathing? 0 0  Doing errands, shopping? 0 0  Preparing Food and eating ? N N  Using the Toilet? N N  In the past six months, have you  accidently leaked urine? N N  Do you have problems with loss of bowel control? N N  Managing your Medications? N N  Managing your Finances? N N  Housekeeping or managing your Housekeeping? N N    Patient Care Team: Alfredia Ferguson, PA-C as PCP - General (Physician Assistant)  Indicate any recent Medical Services you may have received from other than [facility] providers in the past year (date may be approximate).     Assessment:   This is a routine wellness examination for Claudette.  Hearing/Vision screen Hearing Screening - Comments:: No aids Vision Screening - Comments:: Wears glasses- [facility]  Dietary issues and exercise activities discussed: Current Exercise Habits: Home exercise routine, Type of exercise: walking;strength training/weights, Time (Minutes): 60, Frequency (Times/Week): 6, Weekly Exercise (Minutes/Week): 360, Intensity: Mild   Goals Addressed             This Visit's Progress    DIET - EAT MORE FRUITS AND VEGETABLES         Depression Screen    09/23/2021    8:23 AM 07/02/2021    9:03 AM 09/12/2020    9:01 AM 10/18/2019    8:32 AM 08/14/2019    7:24 AM 08/10/2018    9:20 AM 06/03/2017   10:06 AM  PHQ 2/9 Scores  PHQ - 2 Score 0 0 0 0 0 0 0  PHQ- 9 Score 0 0 0 0       Fall Risk    09/23/2021    8:26 AM 09/19/2021   11:26 AM 07/02/2021    9:03 AM 10/18/2019    8:32 AM 08/14/2019    7:24 AM  Fall Risk   Falls in the past year? 0 0 0 0 0  Number falls in past yr: 0 0 0 0 0  Injury with Fall? 0 0 0 0 0  Risk for fall due to : No Fall Risks  No Fall  Risks No Fall Risks No Fall Risks  Follow up Falls evaluation completed   Falls evaluation completed Falls evaluation completed    FALL RISK PREVENTION PERTAINING TO THE HOME:  Any stairs in or around the home? No  If so, are there any without handrails? No  Home free of loose throw rugs in walkways, pet beds, electrical cords, etc? Yes  Adequate lighting in your home to reduce risk of falls? Yes   ASSISTIVE DEVICES UTILIZED TO PREVENT FALLS:  Life alert? No  Use of a cane, walker or w/c? No  Grab bars in the bathroom? No  Shower chair or bench in shower? Yes  Elevated toilet seat or a handicapped toilet? No   TIMED UP AND GO:  Was the test performed? Yes .  Length of time to ambulate 10 feet: 4 sec.   Gait steady and fast without use of assistive device  Cognitive Function:        09/23/2021    8:35 AM 06/03/2017   11:01 AM  6CIT Screen  What Year? 0 points 0 points  What month? 0 points 0 points  What time? 0 points 0 points  Count back from 20 0 points 0 points  Months in reverse 0 points 0 points  Repeat phrase 0 points 0 points  Total Score 0 points 0 points    Immunizations Immunization History  Administered Date(s) Administered   H1N1 12/22/2007   Influenza Split 11/19/2005, 10/07/2009, 10/10/2010   Influenza, High Dose Seasonal PF 10/28/2020   Influenza,inj,Quad PF,6+ Mos 10/20/2014  Influenza-Unspecified 10/15/2015, 10/18/2017   Janssen (J&J) SARS-COV-2 Vaccination 06/02/2019   Pneumococcal Conjugate-13 06/03/2017   Tdap 10/10/2010   Zoster Recombinat (Shingrix) 05/28/2021, 09/10/2021   Zoster, Live 02/16/2015    TDAP status: Due, Education has been provided regarding the importance of this vaccine. Advised may receive this vaccine at local pharmacy or Health Dept. Aware to provide a copy of the vaccination record if obtained from local pharmacy or Health Dept. Verbalized acceptance and understanding.  Flu Vaccine status: Up to date  Pneumococcal  vaccine status: Declined,  Education has been provided regarding the importance of this vaccine but patient still declined. Advised may receive this vaccine at local pharmacy or Health Dept. Aware to provide a copy of the vaccination record if obtained from local pharmacy or Health Dept. Verbalized acceptance and understanding.   Covid-19 vaccine status: Completed vaccines  Qualifies for Shingles Vaccine? Yes   Zostavax completed Yes   Shingrix Completed?: Yes  Screening Tests Health Maintenance  Topic Date Due   COVID-19 Vaccine (2 - Booster for Janssen series) 07/28/2019   INFLUENZA VACCINE  08/19/2021   Pneumonia Vaccine 6965+ Years old (2 - PPSV23 or PCV20) 07/03/2022 (Originally 06/04/2018)   TETANUS/TDAP  07/03/2022 (Originally 10/09/2020)   MAMMOGRAM  02/28/2022   DEXA SCAN  Completed   Hepatitis C Screening  Completed   Zoster Vaccines- Shingrix  Completed   HPV VACCINES  Aged Out   COLONOSCOPY (Pts [phone] Insurance coverage will need to be confirmed)  Discontinued   Fecal DNA (Cologuard)  Discontinued    Health Maintenance  Health Maintenance Due  Topic Date Due   COVID-19 Vaccine (2 - Booster for Janssen series) 07/28/2019   INFLUENZA VACCINE  08/19/2021    Colorectal cancer screening: Type of screening: Colonoscopy. Completed 06/08/17. Repeat every 10 years  Mammogram status: Completed 02/28/21. Repeat every year  Bone Density status: Completed 09/30/20. Results reflect: Bone density results: OSTEOPOROSIS. Repeat every 2 years.  Lung Cancer Screening: (Low Dose CT Chest recommended if Age 5-80 years, 30 pack-year currently smoking OR have quit w/in 15years.) does not qualify.    Additional Screening:  Hepatitis C Screening: does qualify; Completed 11/29/15  Vision Screening: Recommended annual ophthalmology exams for early detection of glaucoma and other disorders of the eye. Is the patient up to date with their annual eye exam?  Yes  Who is the provider or what  is the name of the office in which the patient attends annual eye exams? [facility] If pt is not established with a provider, would they like to be referred to a provider to establish care? No .   Dental Screening: Recommended annual dental exams for proper oral hygiene  Community Resource Referral / Chronic Care Management: CRR required this visit?  No   CCM required this visit?  No      Plan:     I have personally reviewed and noted the following in the patient's chart:   Medical and social history Use of alcohol, tobacco or illicit drugs  Current medications and supplements including opioid prescriptions. Patient is not currently taking opioid prescriptions. Functional ability and status Nutritional status Physical activity Advanced directives List of other physicians Hospitalizations, surgeries, and ER visits in previous 12 months Vitals Screenings to include cognitive, depression, and falls Referrals and appointments  In addition, I have reviewed and discussed with patient certain preventive protocols, quality metrics, and best practice recommendations. A written personalized care plan for preventive services as well as general preventive health recommendations  were provided to patient.     Hal Hope, LPN   06/20/7033   Nurse Notes: none

## 2021-09-23 NOTE — Patient Instructions (Signed)
[name] , Thank you for taking time to come for your Medicare Wellness Visit. I appreciate your ongoing commitment to your health goals. Please review the following plan we discussed and let me know if I can assist you in the future.   Screening recommendations/referrals: Colonoscopy: 06/08/17 Mammogram: 02/28/21 Bone Density: 09/30/20 Recommended yearly ophthalmology/optometry visit for glaucoma screening and checkup Recommended yearly dental visit for hygiene and checkup  Vaccinations: Influenza vaccine: 10/29/20 Pneumococcal vaccine: 06/03/17 Tdap vaccine: 10/10/10, due if have injury Shingles vaccine: Zostavax 02/16/15 Shingrix 05/28/21, 09/10/21   Covid-19:06/02/19  Advanced directives: no  Conditions/risks identified: none  Next appointment: Follow up in one year for your annual wellness visit - 09/28/22 @ 8:15 am in person   Preventive Care 65 Years and Older, Female Preventive care refers to lifestyle choices and visits with your health care provider that can promote health and wellness. What does preventive care include? A yearly physical exam. This is also called an annual well check. Dental exams once or twice a year. Routine eye exams. Ask your health care provider how often you should have your eyes checked. Personal lifestyle choices, including: Daily care of your teeth and gums. Regular physical activity. Eating a healthy diet. Avoiding tobacco and drug use. Limiting alcohol use. Practicing safe sex. Taking low-dose aspirin every day. Taking vitamin and mineral supplements as recommended by your health care provider. What happens during an annual well check? The services and screenings done by your health care provider during your annual well check will depend on your age, overall health, lifestyle risk factors, and family history of disease. Counseling  Your health care provider may ask you questions about your: Alcohol use. Tobacco use. Drug use. Emotional  well-being. Home and relationship well-being. Sexual activity. Eating habits. History of falls. Memory and ability to understand (cognition). Work and work Astronomer. Reproductive health. Screening  You may have the following tests or measurements: Height, weight, and BMI. Blood pressure. Lipid and cholesterol levels. These may be checked every 5 years, or more frequently if you are over 105 years old. Skin check. Lung cancer screening. You may have this screening every year starting at age 33 if you have a 30-pack-year history of smoking and currently smoke or have quit within the past 15 years. Fecal occult blood test (FOBT) of the stool. You may have this test every year starting at age 9. Flexible sigmoidoscopy or colonoscopy. You may have a sigmoidoscopy every 5 years or a colonoscopy every 10 years starting at age 68. Hepatitis C blood test. Hepatitis B blood test. Sexually transmitted disease (STD) testing. Diabetes screening. This is done by checking your blood sugar (glucose) after you have not eaten for a while (fasting). You may have this done every 1-3 years. Bone density scan. This is done to screen for osteoporosis. You may have this done starting at age 40. Mammogram. This may be done every 1-2 years. Talk to your health care provider about how often you should have regular mammograms. Talk with your health care provider about your test results, treatment options, and if necessary, the need for more tests. Vaccines  Your health care provider may recommend certain vaccines, such as: Influenza vaccine. This is recommended every year. Tetanus, diphtheria, and acellular pertussis (Tdap, Td) vaccine. You may need a Td booster every 10 years. Zoster vaccine. You may need this after age 4. Pneumococcal 13-valent conjugate (PCV13) vaccine. One dose is recommended after age 13. Pneumococcal polysaccharide (PPSV23) vaccine. One dose is recommended after  age 2. Talk to your  health care provider about which screenings and vaccines you need and how often you need them. This information is not intended to replace advice given to you by your health care provider. Make sure you discuss any questions you have with your health care provider. Document Released: 02/01/2015 Document Revised: 09/25/2015 Document Reviewed: 11/06/2014 Elsevier Interactive Patient Education  2017 [place] Prevention in the Home Falls can cause injuries. They can happen to people of all ages. There are many things you can do to make your home safe and to help prevent falls. What can I do on the outside of my home? Regularly fix the edges of walkways and driveways and fix any cracks. Remove anything that might make you trip as you walk through a door, such as a raised step or threshold. Trim any bushes or trees on the path to your home. Use bright outdoor lighting. Clear any walking paths of anything that might make someone trip, such as rocks or tools. Regularly check to see if handrails are loose or broken. Make sure that both sides of any steps have handrails. Any raised decks and porches should have guardrails on the edges. Have any leaves, snow, or ice cleared regularly. Use sand or salt on walking paths during winter. Clean up any spills in your garage right away. This includes oil or grease spills. What can I do in the bathroom? Use night lights. Install grab bars by the toilet and in the tub and shower. Do not use towel bars as grab bars. Use non-skid mats or decals in the tub or shower. If you need to sit down in the shower, use a plastic, non-slip stool. Keep the floor dry. Clean up any water that spills on the floor as soon as it happens. Remove soap buildup in the tub or shower regularly. Attach bath mats securely with double-sided non-slip rug tape. Do not have throw rugs and other things on the floor that can make you trip. What can I do in the bedroom? Use night  lights. Make sure that you have a light by your bed that is easy to reach. Do not use any sheets or blankets that are too big for your bed. They should not hang down onto the floor. Have a firm chair that has side arms. You can use this for support while you get dressed. Do not have throw rugs and other things on the floor that can make you trip. What can I do in the kitchen? Clean up any spills right away. Avoid walking on wet floors. Keep items that you use a lot in easy-to-reach places. If you need to reach something above you, use a strong step stool that has a grab bar. Keep electrical cords out of the way. Do not use floor polish or wax that makes floors slippery. If you must use wax, use non-skid floor wax. Do not have throw rugs and other things on the floor that can make you trip. What can I do with my stairs? Do not leave any items on the stairs. Make sure that there are handrails on both sides of the stairs and use them. Fix handrails that are broken or loose. Make sure that handrails are as long as the stairways. Check any carpeting to make sure that it is firmly attached to the stairs. Fix any carpet that is loose or worn. Avoid having throw rugs at the top or bottom of the stairs. If you do  have throw rugs, attach them to the floor with carpet tape. Make sure that you have a light switch at the top of the stairs and the bottom of the stairs. If you do not have them, ask someone to add them for you. What else can I do to help prevent falls? Wear shoes that: Do not have high heels. Have rubber bottoms. Are comfortable and fit you well. Are closed at the toe. Do not wear sandals. If you use a stepladder: Make sure that it is fully opened. Do not climb a closed stepladder. Make sure that both sides of the stepladder are locked into place. Ask someone to hold it for you, if possible. Clearly mark and make sure that you can see: Any grab bars or handrails. First and last  steps. Where the edge of each step is. Use tools that help you move around (mobility aids) if they are needed. These include: Canes. Walkers. Scooters. Crutches. Turn on the lights when you go into a dark area. Replace any light bulbs as soon as they burn out. Set up your furniture so you have a clear path. Avoid moving your furniture around. If any of your floors are uneven, fix them. If there are any pets around you, be aware of where they are. Review your medicines with your doctor. Some medicines can make you feel dizzy. This can increase your chance of falling. Ask your doctor what other things that you can do to help prevent falls. This information is not intended to replace advice given to you by your health care provider. Make sure you discuss any questions you have with your health care provider. Document Released: 11/01/2008 Document Revised: 06/13/2015 Document Reviewed: 02/09/2014 Elsevier Interactive Patient Education  2017 [employer].

## 2021-10-21 DIAGNOSIS — M19072 Primary osteoarthritis, left ankle and foot: Secondary | ICD-10-CM | POA: Diagnosis not present

## 2021-10-21 DIAGNOSIS — M79672 Pain in left foot: Secondary | ICD-10-CM | POA: Diagnosis not present

## 2021-12-05 DIAGNOSIS — Z23 Encounter for immunization: Secondary | ICD-10-CM | POA: Diagnosis not present

## 2021-12-09 ENCOUNTER — Encounter: Payer: Self-pay | Admitting: Physician Assistant

## 2021-12-09 NOTE — Progress Notes (Unsigned)
I,Sha'taria Tyson,acting as a Education administrator for [employer], PA-C.,have documented all relevant documentation on the behalf of [name], PA-C,as directed by  [name], PA-C while in the presence of [name], PA-C.   Established patient visit   Patient: [name]   DOB: 03-01-1950   71 y.o. Female  MRN: [id number] Visit Date: 12/10/2021  Today's healthcare provider: [name], PA-C   No chief complaint on file.  Subjective    HPI  Hypertension, follow-up  BP Readings from Last 3 Encounters:  09/23/21 130/70  07/02/21 130/80  09/13/20 110/70   Wt Readings from Last 3 Encounters:  09/23/21 125 lb 8 oz (56.9 kg)  07/02/21 127 lb (57.6 kg)  09/13/20 130 lb (59 kg)     She was last seen for hypertension 5 months ago.  BP at that visit was 130/80. Management since that visit includes continue current treatment.  She reports {excellent/good/fair/poor:19665} compliance with treatment. She {is/is not:9024} having side effects. {document side effects if present:1} She is following a {diet:21022986} diet. She {is/is not:9024} exercising. She {does/does not:200015} smoke.  Use of agents associated with hypertension: {bp agents assoc with hypertension:511::"none"}.   Outside blood pressures are {***enter patient reported home BP readings, or 'not being checked':1}. Symptoms: {Yes/No:20286} chest pain {Yes/No:20286} chest pressure  {Yes/No:20286} palpitations {Yes/No:20286} syncope  {Yes/No:20286} dyspnea {Yes/No:20286} orthopnea  {Yes/No:20286} paroxysmal nocturnal dyspnea {Yes/No:20286} lower extremity edema   Pertinent labs Lab Results  Component Value Date   CHOL 194 09/17/2020   HDL 72 09/17/2020   LDLCALC 111 (H) 09/17/2020   TRIG 58 09/17/2020   CHOLHDL 2.7 09/17/2020   Lab Results  Component Value Date   NA 134 07/02/2021   K 4.6 07/02/2021   CREATININE 0.71 07/02/2021   EGFR 92 07/02/2021   GLUCOSE 86 07/02/2021   TSH 2.520 09/17/2020      The 10-year ASCVD risk score (Arnett DK, et al., 2019) is: 10.9%  ---------------------------------------------------------------------------------------------------   Medications: Outpatient Medications Prior to Visit  Medication Sig   amitriptyline (ELAVIL) 50 MG tablet Take 1 tablet (50 mg total) by mouth at bedtime.   azelastine (ASTELIN) 0.1 % nasal spray Place 2 sprays into both nostrils 2 (two) times daily for 7 days. Use in each nostril as directed   Calcium Carbonate-Vitamin D 600-200 MG-UNIT CAPS Take 2 tablets by mouth daily.   Calcium Glycerophosphate (PRELIEF PO) Take 2 tablets by mouth daily. As needed   Cholecalciferol 125 MCG (5000 UT) TABS Take by mouth.   clobetasol ointment (TEMOVATE) 0.05 % Apply to affected area every night for 4 weeks, then every other day for 4 weeks and then twice a week for 4 weeks or until resolution.   Flaxseed, Linseed, (FLAXSEED OIL PO) Take by mouth.   Lifitegrast (XIIDRA) 5 % SOLN Apply to eye.   mometasone (ELOCON) 0.1 % cream Apply 1 application topically daily.   MYRBETRIQ 50 MG TB24 tablet    pantoprazole (PROTONIX) 40 MG tablet Take 1 tablet (40 mg total) by mouth daily.   triamterene-hydrochlorothiazide (MAXZIDE-25) 37.5-25 MG tablet Take 1 tablet by mouth daily. Please make an appointment in the office for future refills   vitamin C (ASCORBIC ACID) 500 MG tablet Take 500 mg by mouth daily.   No facility-administered medications prior to visit.    Review of Systems  {Labs  Heme  Chem  Endocrine  Serology  Results Review (optional):23779}   Objective    There were no vitals taken for this  visit. {Show previous vital signs (optional):23777}  Physical Exam  ***  No results found for any visits on 12/10/21.  Assessment & Plan     ***  No follow-ups on file.      {provider attestation***:1}   [name], PA-C  [facility] [phone] (phone) [phone] (fax)  [place]

## 2021-12-10 ENCOUNTER — Encounter: Payer: Self-pay | Admitting: Physician Assistant

## 2021-12-10 ENCOUNTER — Ambulatory Visit (INDEPENDENT_AMBULATORY_CARE_PROVIDER_SITE_OTHER): Payer: Medicare Other | Admitting: Physician Assistant

## 2021-12-10 VITALS — BP 118/67 | HR 90 | Ht 64.5 in | Wt 122.8 lb

## 2021-12-10 DIAGNOSIS — I10 Essential (primary) hypertension: Secondary | ICD-10-CM | POA: Diagnosis not present

## 2021-12-10 MED ORDER — TRIAMTERENE-HCTZ 37.5-25 MG PO TABS: 1.0000 | ORAL_TABLET | Freq: Every day | ORAL | 1 refills | Status: DC

## 2021-12-10 NOTE — Assessment & Plan Note (Signed)
Chronic, well controlled Managed with maxide Reviewed cmp  F/u 6 mo

## 2022-01-06 ENCOUNTER — Ambulatory Visit (INDEPENDENT_AMBULATORY_CARE_PROVIDER_SITE_OTHER): Payer: Medicare Other | Admitting: Obstetrics and Gynecology

## 2022-01-06 ENCOUNTER — Encounter: Payer: Self-pay | Admitting: Obstetrics and Gynecology

## 2022-01-06 VITALS — BP 128/90 | Ht 64.5 in | Wt 125.0 lb

## 2022-01-06 DIAGNOSIS — N898 Other specified noninflammatory disorders of vagina: Secondary | ICD-10-CM

## 2022-01-06 LAB — POCT WET PREP WITH KOH
Clue Cells Wet Prep HPF POC: NEGATIVE
KOH Prep POC: NEGATIVE
Trichomonas, UA: NEGATIVE
Yeast Wet Prep HPF POC: NEGATIVE

## 2022-01-06 MED ORDER — CLOTRIMAZOLE-BETAMETHASONE 1-0.05 % EX CREA: TOPICAL_CREAM | CUTANEOUS | 0 refills | Status: DC

## 2022-01-06 NOTE — Progress Notes (Signed)
Laura Ferguson, PA-C   Chief Complaint  Patient presents with   Vaginal Exam    A lot of vag irritation    HPI:      [name] is a 71 y.o. [id number] whose LMP was No LMP recorded. Patient is postmenopausal., presents today for vaginal irritation/burning going towards rectal area, no d/c or fishy odor; sx for about 10 days. Treating with vagisil crm with some sx improvement. No recent abx use. Uses unscented soap, no dryer sheets, wearing cotton crotch underwear. Not sexually active. Hx of LS, treated with clobetasol in past. Was doing regularly a few months ago, not recently. Put some on a few days ago that increased burning sensation.  No longer sexually active  Patient Active Problem List   Diagnosis Date Noted   Otosclerosis of right ear 08/14/2019   Hyperlipidemia    Age-related osteoporosis without current pathological fracture 06/03/2017   History of right cataract surgery 09/29/2016   Chest pain on exertion 05/28/2016   Right retinal detachment 05/01/2016   Varicose veins of both lower extremities with inflammation 11/21/2015   Chronic venous insufficiency 11/21/2015   Pain in limb 11/21/2015   GERD (gastroesophageal reflux disease) 11/23/2014   Allergic rhinitis 07/17/2014   History of tobacco use 02/26/2006   Interstitial cystitis 02/22/2006   Abdominal hernia with gangrene and obstruction 02/22/2006   Essential hypertension 04/22/1999    Past Surgical History:  Procedure Laterality Date   BLADDER SUSPENSION     TRANSVAGINAL SLING   BUNIONECTOMY  12/02/2017   CATARACT EXTRACTION W/PHACO Right 09/29/2016   Procedure: CATARACT EXTRACTION PHACO AND INTRAOCULAR LENS PLACEMENT ([facility]);  Surgeon: Galen Manila, MD;  Location: [facility] ORS;  Service: Ophthalmology;  Laterality: Right;  [place] 00:47 AP% 21.6 CDE 10.26 fluid pack lot # [id number]   CHOLECYSTECTOMY     EYE SURGERY     INSERTION OF MESH  2008   Trans vaginal mesh   LASER ABLATION Right 11/21/2015    [facility] Dr. Gilda Crease    Family History  Problem Relation Age of Onset   Congestive Heart Failure Mother    Cancer Father        Bladder   Bladder Cancer Father    Hypertension Father    Coronary artery disease Father        Stent Placed 1990   Liver disease Father    Hypertension Sister    Lung cancer Sister    Breast cancer Paternal Aunt        70s   Healthy Son    Healthy Son     Social History   Socioeconomic History   Marital status: Married    Spouse name: Not on file   Number of children: 2   Years of education: H/S   Highest education level: Not on file  Occupational History   Occupation: Full-time  Tobacco Use   Smoking status: Former    Packs/day: 1.00    Years: 35.00    Total pack years: 35.00    Types: Cigarettes    Quit date: 01/20/1996    Years since quitting: 25.9   Smokeless tobacco: Never  Vaping Use   Vaping Use: Never used  Substance and Sexual Activity   Alcohol use: No   Drug use: No   Sexual activity: Not Currently    Birth control/protection: Post-menopausal  Other Topics Concern   Not on file  Social History Narrative   Not on file  Social Determinants of Health   Financial Resource Strain: Low Risk  (09/23/2021)   Overall Financial Resource Strain (CARDIA)    Difficulty of Paying Living Expenses: Not hard at all  Food Insecurity: No Food Insecurity (09/23/2021)   Hunger Vital Sign    Worried About Running Out of Food in the Last Year: Never true    Ran Out of Food in the Last Year: Never true  Transportation Needs: No Transportation Needs (09/23/2021)   PRAPARE - Administrator, Civil Service (Medical): No    Lack of Transportation (Non-Medical): No  Physical Activity: Sufficiently Active (09/23/2021)   Exercise Vital Sign    Days of Exercise per Week: 6 days    Minutes of Exercise per Session: 60 min  Stress: No Stress Concern Present (09/23/2021)   [employer] of Occupational Health - Occupational  Stress Questionnaire    Feeling of Stress : Not at all  Social Connections: Moderately Integrated (09/23/2021)   Social Connection and Isolation Panel [NHANES]    Frequency of Communication with Friends and Family: More than three times a week    Frequency of Social Gatherings with Friends and Family: More than three times a week    Attends Religious Services: More than 4 times per year    Active Member of [employer] or Organizations: No    Attends Banker Meetings: Never    Marital Status: Married  Catering manager Violence: Not At Risk (09/23/2021)   Humiliation, Afraid, Rape, and Kick questionnaire    Fear of Current or Ex-Partner: No    Emotionally Abused: No    Physically Abused: No    Sexually Abused: No    Outpatient Medications Prior to Visit  Medication Sig Dispense Refill   amitriptyline (ELAVIL) 50 MG tablet Take 1 tablet (50 mg total) by mouth at bedtime. 90 tablet 1   Calcium Carbonate-Vitamin D 600-200 MG-UNIT CAPS Take 2 tablets by mouth daily.     Calcium Glycerophosphate (PRELIEF PO) Take 2 tablets by mouth daily. As needed     Cholecalciferol 125 MCG (5000 UT) TABS Take by mouth.     Flaxseed, Linseed, (FLAXSEED OIL PO) Take by mouth.     Lifitegrast (XIIDRA) 5 % SOLN Apply to eye.     MYRBETRIQ 50 MG TB24 tablet      pantoprazole (PROTONIX) 40 MG tablet Take 1 tablet (40 mg total) by mouth daily. 90 tablet 1   triamterene-hydrochlorothiazide (MAXZIDE-25) 37.5-25 MG tablet Take 1 tablet by mouth daily. 90 tablet 1   vitamin C (ASCORBIC ACID) 500 MG tablet Take 500 mg by mouth daily.     No facility-administered medications prior to visit.      ROS:  Review of Systems  Constitutional:  Negative for fever.  Gastrointestinal:  Negative for blood in stool, constipation, diarrhea, nausea and vomiting.  Genitourinary:  Negative for dyspareunia, dysuria, flank pain, frequency, hematuria, urgency, vaginal bleeding, vaginal discharge and vaginal pain.   Musculoskeletal:  Negative for back pain.  Skin:  Negative for rash.   BREAST: No symptoms   OBJECTIVE:   Vitals:  BP (!) 128/90   Ht 5' 4.5" (1.638 m)   Wt 125 lb (56.7 kg)   BMI 21.12 kg/m   Physical Exam Genitourinary:    Labia:        Right: Rash present. No tenderness or lesion.        Left: Rash present. No tenderness or lesion.      Vagina: No vaginal  discharge.     Comments: BILAT LABIA MAJORA, PERINEAL AREA, PERIANAL AREA WITH ERYTHEMA, NO SCALE; NO LESIONS; NO EVIDENCE OF LS    Results: Results for orders placed or performed in visit on 01/06/22 (from the past 24 hour(s))  POCT Wet Prep with KOH     Status: Normal   Collection Time: 01/06/22 11:51 AM  Result Value Ref Range   Trichomonas, UA Negative    Clue Cells Wet Prep HPF POC neg    Epithelial Wet Prep HPF POC     Yeast Wet Prep HPF POC neg    Bacteria Wet Prep HPF POC     RBC Wet Prep HPF POC     WBC Wet Prep HPF POC     KOH Prep POC Negative Negative     Assessment/Plan: Vaginal irritation - Plan: clotrimazole-betamethasone (LOTRISONE) cream, POCT Wet Prep with KOH; pos sx and exam, net wet prep. Looks more fungal; no evid of LS. Rx lotrisone crm for 1-2 wks. F/u prn.    Meds ordered this encounter  Medications   clotrimazole-betamethasone (LOTRISONE) cream    Sig: Apply externally BID prn sx for 1-2 wks    Dispense:  15 g    Refill:  0    Order Specific Question:   Supervising Provider    Answer:   Hildred Laser [[id number]]      Return if symptoms worsen or fail to improve.  Tera Pellicane B. Avalon Coppinger, PA-C 01/06/2022 11:52 AM

## 2022-01-15 ENCOUNTER — Other Ambulatory Visit: Payer: Self-pay | Admitting: Physician Assistant

## 2022-01-15 DIAGNOSIS — Z1231 Encounter for screening mammogram for malignant neoplasm of breast: Secondary | ICD-10-CM

## 2022-02-06 ENCOUNTER — Encounter: Payer: Self-pay | Admitting: Obstetrics and Gynecology

## 2022-02-11 ENCOUNTER — Other Ambulatory Visit: Payer: Self-pay | Admitting: Physician Assistant

## 2022-02-11 ENCOUNTER — Ambulatory Visit: Payer: Medicare Other | Admitting: Obstetrics

## 2022-02-11 DIAGNOSIS — N3010 Interstitial cystitis (chronic) without hematuria: Secondary | ICD-10-CM

## 2022-02-12 ENCOUNTER — Encounter: Payer: Self-pay | Admitting: Obstetrics and Gynecology

## 2022-02-12 ENCOUNTER — Ambulatory Visit (INDEPENDENT_AMBULATORY_CARE_PROVIDER_SITE_OTHER): Payer: Medicare Other | Admitting: Obstetrics and Gynecology

## 2022-02-12 VITALS — BP 120/80 | Ht 64.5 in | Wt 124.0 lb

## 2022-02-12 DIAGNOSIS — N898 Other specified noninflammatory disorders of vagina: Secondary | ICD-10-CM

## 2022-02-12 MED ORDER — KETOCONAZOLE 2 % EX CREA: 1.0000 | TOPICAL_CREAM | Freq: Every day | CUTANEOUS | 0 refills | Status: AC

## 2022-02-12 NOTE — Progress Notes (Signed)
[name], PA-C   Chief Complaint  Patient presents with   Vaginal Irritation    Cream cleared sx for a few days and irritation is back    HPI:      [name] is a 72 y.o. [id number] whose LMP was No LMP recorded. Patient is postmenopausal., presents today for persistent vaginal/perineal irritation. Seen 01/06/22 for vaginitis c/w fungal etiology. Treated with lotrisone crm BID for 2 wks with some sx improvement, but then sx recurred. Restarted lotrisone crm again and sx come and go. Wearing nylon underwear with cotton crotch, trying to keep area dry. Using castille unscented soap, no dryer sheets. Changing underwear during day to keep dry.  Still no increased d/c, odor.   01/06/22 NOTE: presents with vaginal irritation/burning going towards rectal area, no d/c or fishy odor; sx for about 10 days. Treating with vagisil crm with some sx improvement. No recent abx use. Uses unscented soap, no dryer sheets, wearing cotton crotch underwear. Not sexually active. Hx of LS, treated with clobetasol in past. Was doing regularly a few months ago, not recently. Put some on a few days ago that increased burning sensation.  No longer sexually active  Patient Active Problem List   Diagnosis Date Noted   Otosclerosis of right ear 08/14/2019   Hyperlipidemia    Age-related osteoporosis without current pathological fracture 06/03/2017   History of right cataract surgery 09/29/2016   Chest pain on exertion 05/28/2016   Right retinal detachment 05/01/2016   Varicose veins of both lower extremities with inflammation 11/21/2015   Chronic venous insufficiency 11/21/2015   Pain in limb 11/21/2015   GERD (gastroesophageal reflux disease) 11/23/2014   Allergic rhinitis 07/17/2014   History of tobacco use 02/26/2006   Interstitial cystitis 02/22/2006   Abdominal hernia with gangrene and obstruction 02/22/2006   Essential hypertension 04/22/1999    Past Surgical History:  Procedure Laterality  Date   BLADDER SUSPENSION     TRANSVAGINAL SLING   BUNIONECTOMY  12/02/2017   CATARACT EXTRACTION W/PHACO Right 09/29/2016   Procedure: CATARACT EXTRACTION PHACO AND INTRAOCULAR LENS PLACEMENT ([place]);  Surgeon: Birder Robson, MD;  Location: [facility] ORS;  Service: Ophthalmology;  Laterality: Right;  [place] 00:47 AP% 21.6 CDE 10.26 fluid pack lot # [id number]   CHOLECYSTECTOMY     EYE SURGERY     INSERTION OF MESH  2008   Trans vaginal mesh   LASER ABLATION Right 11/21/2015   [facility] Dr. Delana Meyer    Family History  Problem Relation Age of Onset   Congestive Heart Failure Mother    Cancer Father        Bladder   Bladder Cancer Father    Hypertension Father    Coronary artery disease Father        Stent Placed 1990   Liver disease Father    Hypertension Sister    Lung cancer Sister    Breast cancer Paternal Aunt        16s   Healthy Son    Healthy Son     Social History   Socioeconomic History   Marital status: Married    Spouse name: Not on file   Number of children: 2   Years of education: H/S   Highest education level: Not on file  Occupational History   Occupation: Full-time  Tobacco Use   Smoking status: Former    Packs/day: 1.00    Years: 35.00    Total pack years: 35.00  Types: Cigarettes    Quit date: 01/20/1996    Years since quitting: 26.0   Smokeless tobacco: Never  Vaping Use   Vaping Use: Never used  Substance and Sexual Activity   Alcohol use: No   Drug use: No   Sexual activity: Not Currently    Birth control/protection: Post-menopausal  Other Topics Concern   Not on file  Social History Narrative   Not on file   Social Determinants of Health   Financial Resource Strain: Low Risk  (09/23/2021)   Overall Financial Resource Strain (CARDIA)    Difficulty of Paying Living Expenses: Not hard at all  Food Insecurity: No Food Insecurity (09/23/2021)   Hunger Vital Sign    Worried About Running Out of Food in the Last Year: Never true     Ran Out of Food in the Last Year: Never true  Transportation Needs: No Transportation Needs (09/23/2021)   PRAPARE - Administrator, Civil Service (Medical): No    Lack of Transportation (Non-Medical): No  Physical Activity: Sufficiently Active (09/23/2021)   Exercise Vital Sign    Days of Exercise per Week: 6 days    Minutes of Exercise per Session: 60 min  Stress: No Stress Concern Present (09/23/2021)   [employer] of Occupational Health - Occupational Stress Questionnaire    Feeling of Stress : Not at all  Social Connections: Moderately Integrated (09/23/2021)   Social Connection and Isolation Panel [NHANES]    Frequency of Communication with Friends and Family: More than three times a week    Frequency of Social Gatherings with Friends and Family: More than three times a week    Attends Religious Services: More than 4 times per year    Active Member of [employer] or Organizations: No    Attends Banker Meetings: Never    Marital Status: Married  Catering manager Violence: Not At Risk (09/23/2021)   Humiliation, Afraid, Rape, and Kick questionnaire    Fear of Current or Ex-Partner: No    Emotionally Abused: No    Physically Abused: No    Sexually Abused: No    Outpatient Medications Prior to Visit  Medication Sig Dispense Refill   amitriptyline (ELAVIL) 50 MG tablet Take 1 tablet (50 mg total) by mouth at bedtime. 90 tablet 1   Calcium Carbonate-Vitamin D 600-200 MG-UNIT CAPS Take 2 tablets by mouth daily.     Calcium Glycerophosphate (PRELIEF PO) Take 2 tablets by mouth daily. As needed     Cholecalciferol 125 MCG (5000 UT) TABS Take by mouth.     clotrimazole-betamethasone (LOTRISONE) cream Apply externally BID prn sx for 1-2 wks 15 g 0   Flaxseed, Linseed, (FLAXSEED OIL PO) Take by mouth.     Lifitegrast (XIIDRA) 5 % SOLN Apply to eye.     MYRBETRIQ 50 MG TB24 tablet      pantoprazole (PROTONIX) 40 MG tablet Take 1 tablet (40 mg total) by mouth daily.  90 tablet 1   triamterene-hydrochlorothiazide (MAXZIDE-25) 37.5-25 MG tablet Take 1 tablet by mouth daily. 90 tablet 1   vitamin C (ASCORBIC ACID) 500 MG tablet Take 500 mg by mouth daily.     No facility-administered medications prior to visit.      ROS:  Review of Systems  Constitutional:  Negative for fever.  Gastrointestinal:  Negative for blood in stool, constipation, diarrhea, nausea and vomiting.  Genitourinary:  Negative for dyspareunia, dysuria, flank pain, frequency, hematuria, urgency, vaginal bleeding, vaginal discharge and vaginal pain.  Musculoskeletal:  Negative for back pain.  Skin:  Negative for rash.   BREAST: No symptoms   OBJECTIVE:   Vitals:  BP 120/80   Ht 5' 4.5" (1.638 m)   Wt 124 lb (56.2 kg)   BMI 20.96 kg/m   Physical Exam Genitourinary:    Labia:        Right: Rash present. No tenderness or lesion.        Left: No rash, tenderness or lesion.      Vagina: No vaginal discharge.       Assessment/Plan: Vaginal irritation - still looks fungal. Not fully improved with clotrimazole but other areas look normal again. Try ketoconazole crm ext for 2 wks. Change to cotton underwear. F/u prn.    Meds ordered this encounter  Medications   ketoconazole (NIZORAL) 2 % cream    Sig: Apply 1 Application topically daily for 14 days.    Dispense:  15 g    Refill:  0    Order Specific Question:   Supervising Provider    Answer:   Rubie Maid [[id number]]      Return if symptoms worsen or fail to improve.  Shalayne Leach B. Kelii Chittum, PA-C 02/12/2022 5:35 PM

## 2022-03-02 ENCOUNTER — Ambulatory Visit
Admission: RE | Admit: 2022-03-02 | Discharge: 2022-03-02 | Disposition: A | Discharge status: Home / Self Care | Payer: Medicare Other | Source: Ambulatory Visit | Attending: Physician Assistant | Admitting: Physician Assistant

## 2022-03-02 ENCOUNTER — Other Ambulatory Visit: Payer: Self-pay | Admitting: Physician Assistant

## 2022-03-02 DIAGNOSIS — Z1231 Encounter for screening mammogram for malignant neoplasm of breast: Secondary | ICD-10-CM | POA: Insufficient documentation

## 2022-03-02 DIAGNOSIS — K219 Gastro-esophageal reflux disease without esophagitis: Secondary | ICD-10-CM

## 2022-03-05 ENCOUNTER — Encounter: Payer: Self-pay | Admitting: Obstetrics and Gynecology

## 2022-03-05 DIAGNOSIS — M79672 Pain in left foot: Secondary | ICD-10-CM | POA: Diagnosis not present

## 2022-03-05 DIAGNOSIS — M19072 Primary osteoarthritis, left ankle and foot: Secondary | ICD-10-CM | POA: Diagnosis not present

## 2022-04-21 DIAGNOSIS — M2012 Hallux valgus (acquired), left foot: Secondary | ICD-10-CM | POA: Diagnosis not present

## 2022-04-21 DIAGNOSIS — M778 Other enthesopathies, not elsewhere classified: Secondary | ICD-10-CM | POA: Diagnosis not present

## 2022-04-21 DIAGNOSIS — M2042 Other hammer toe(s) (acquired), left foot: Secondary | ICD-10-CM | POA: Diagnosis not present

## 2022-04-21 DIAGNOSIS — M2041 Other hammer toe(s) (acquired), right foot: Secondary | ICD-10-CM | POA: Diagnosis not present

## 2022-04-21 DIAGNOSIS — M2011 Hallux valgus (acquired), right foot: Secondary | ICD-10-CM | POA: Diagnosis not present

## 2022-04-22 DIAGNOSIS — H9011 Conductive hearing loss, unilateral, right ear, with unrestricted hearing on the contralateral side: Secondary | ICD-10-CM | POA: Diagnosis not present

## 2022-04-22 DIAGNOSIS — H8091 Unspecified otosclerosis, right ear: Secondary | ICD-10-CM | POA: Diagnosis not present

## 2022-06-08 DIAGNOSIS — M3501 Sicca syndrome with keratoconjunctivitis: Secondary | ICD-10-CM | POA: Diagnosis not present

## 2022-06-08 DIAGNOSIS — H2512 Age-related nuclear cataract, left eye: Secondary | ICD-10-CM | POA: Diagnosis not present

## 2022-06-08 DIAGNOSIS — H21233 Degeneration of iris (pigmentary), bilateral: Secondary | ICD-10-CM | POA: Diagnosis not present

## 2022-06-10 ENCOUNTER — Ambulatory Visit (INDEPENDENT_AMBULATORY_CARE_PROVIDER_SITE_OTHER): Payer: Medicare Other | Admitting: Physician Assistant

## 2022-06-10 ENCOUNTER — Encounter: Payer: Self-pay | Admitting: Physician Assistant

## 2022-06-10 VITALS — BP 139/71 | HR 78 | Temp 98.0°F | Resp 13 | Ht 64.5 in | Wt 121.6 lb

## 2022-06-10 DIAGNOSIS — I10 Essential (primary) hypertension: Secondary | ICD-10-CM

## 2022-06-10 DIAGNOSIS — E782 Mixed hyperlipidemia: Secondary | ICD-10-CM | POA: Diagnosis not present

## 2022-06-10 MED ORDER — TRIAMTERENE-HCTZ 37.5-25 MG PO TABS
1.0000 | ORAL_TABLET | Freq: Every day | ORAL | 3 refills | Status: DC
Start: 2022-06-10 — End: 2023-07-13

## 2022-06-10 NOTE — Assessment & Plan Note (Signed)
Wel controlled continue maxide Ordered cmp F/u 6 mo

## 2022-06-10 NOTE — Progress Notes (Signed)
I,Vanessa  Vital,acting as a Neurosurgeon for [employer], PA-C.,have documented all relevant documentation on the behalf of Alfredia Ferguson, PA-C,as directed by  Alfredia Ferguson, PA-C while in the presence of Alfredia Ferguson, PA-C.    Established patient visit   Patient: [name]   DOB: Sep 01, 1950   72 y.o. Female  MRN: [id number] Visit Date: 06/10/2022  Today's healthcare provider: Alfredia Ferguson, PA-C   Cc. Htn f/u  Subjective    HPI   Hypertension, follow-up  BP Readings from Last 3 Encounters:  06/10/22 139/71  02/12/22 120/80  01/06/22 (!) 128/90   Wt Readings from Last 3 Encounters:  06/10/22 121 lb 9.6 oz (55.2 kg)  02/12/22 124 lb (56.2 kg)  01/06/22 125 lb (56.7 kg)     She was last seen for hypertension 6 months ago.  BP at that visit was 118/67. Management since that visit includes no changes.  She reports good compliance with treatment. She is not having side effects. She is following a Regular diet. She is exercising. She does not smoke.  Symptoms: No chest pain No chest pressure  No palpitations No syncope  No dyspnea No orthopnea  No paroxysmal nocturnal dyspnea No lower extremity edema   Pertinent labs Lab Results  Component Value Date   CHOL 194 09/17/2020   HDL 72 09/17/2020   LDLCALC 111 (H) 09/17/2020   TRIG 58 09/17/2020   CHOLHDL 2.7 09/17/2020   Lab Results  Component Value Date   NA 134 07/02/2021   K 4.6 07/02/2021   CREATININE 0.71 07/02/2021   EGFR 92 07/02/2021   GLUCOSE 86 07/02/2021   TSH 2.520 09/17/2020     The 10-year ASCVD risk score (Arnett DK, et al., 2019) is: 13.9%  ---------------------------------------------------------------------------------------------------  Medications: Outpatient Medications Prior to Visit  Medication Sig   amitriptyline (ELAVIL) 50 MG tablet Take 1 tablet (50 mg total) by mouth at bedtime.   Calcium Carbonate-Vitamin D 600-200 MG-UNIT CAPS Take 2 tablets by mouth daily.   Calcium  Glycerophosphate (PRELIEF PO) Take 2 tablets by mouth daily. As needed   Cholecalciferol 125 MCG (5000 UT) TABS Take by mouth.   Flaxseed, Linseed, (FLAXSEED OIL PO) Take by mouth.   Lifitegrast (XIIDRA) 5 % SOLN Apply to eye.   MYRBETRIQ 50 MG TB24 tablet    pantoprazole (PROTONIX) 40 MG tablet Take 1 tablet (40 mg total) by mouth daily.   vitamin C (ASCORBIC ACID) 500 MG tablet Take 500 mg by mouth daily.   [DISCONTINUED] triamterene-hydrochlorothiazide (MAXZIDE-25) 37.5-25 MG tablet Take 1 tablet by mouth daily.   [DISCONTINUED] clotrimazole-betamethasone (LOTRISONE) cream Apply externally BID prn sx for 1-2 wks   No facility-administered medications prior to visit.    Review of Systems  Constitutional:  Negative for fatigue and fever.  Respiratory:  Negative for cough and shortness of breath.   Cardiovascular:  Negative for chest pain and leg swelling.  Gastrointestinal:  Negative for abdominal pain.  Neurological:  Negative for dizziness and headaches.      Objective    BP 139/71 (BP Location: Right Arm, Patient Position: Sitting, Cuff Size: Normal)   Pulse 78   Temp 98 F (36.7 C) (Oral)   Resp 13   Ht 5' 4.5" (1.638 m)   Wt 121 lb 9.6 oz (55.2 kg)   SpO2 100%   BMI 20.55 kg/m   Physical Exam Constitutional:      General: She is awake.     Appearance: She is well-developed.  HENT:     Head: Normocephalic.  Eyes:     Conjunctiva/sclera: Conjunctivae normal.  Cardiovascular:     Rate and Rhythm: Normal rate and regular rhythm.     Heart sounds: Normal heart sounds.  Pulmonary:     Effort: Pulmonary effort is normal.     Breath sounds: Normal breath sounds.  Musculoskeletal:     Right lower leg: No edema.     Left lower leg: No edema.  Skin:    General: Skin is warm.  Neurological:     Mental Status: She is alert and oriented to person, place, and time.  Psychiatric:        Attention and Perception: Attention normal.        Mood and Affect: Mood normal.         Speech: Speech normal.        Behavior: Behavior is cooperative.     No results found for any visits on 06/10/22.  Assessment & Plan     Problem List Items Addressed This Visit       Cardiovascular and Mediastinum   Essential hypertension - Primary    Wel controlled continue maxide Ordered cmp F/u 6 mo      Relevant Medications   triamterene-hydrochlorothiazide (MAXZIDE-25) 37.5-25 MG tablet   Other Relevant Orders   Comprehensive Metabolic Panel (CMET)   CBC w/Diff/Platelet     Other   Hyperlipidemia   Relevant Medications   triamterene-hydrochlorothiazide (MAXZIDE-25) 37.5-25 MG tablet   Other Relevant Orders   Lipid panel    Return in about 6 months (around 12/11/2022) for CPE.      I, Alfredia Ferguson, PA-C have reviewed all documentation for this visit. The documentation on  06/10/22   for the exam, diagnosis, procedures, and orders are all accurate and complete.  Alfredia Ferguson, PA-C [facility] [address] [place], [place], [address] Office: [phone] Fax: [phone]   [facility]

## 2022-06-11 LAB — COMPREHENSIVE METABOLIC PANEL
ALT: 18 IU/L (ref 0–32)
AST: 20 IU/L (ref 0–40)
Albumin/Globulin Ratio: 1.6 (ref 1.2–2.2)
Albumin: 4.6 g/dL (ref 3.9–4.9)
Alkaline Phosphatase: 78 IU/L (ref 44–121)
BUN/Creatinine Ratio: 16 (ref 12–28)
BUN: 13 mg/dL (ref 8–27)
Bilirubin Total: 0.3 mg/dL (ref 0.0–1.2)
CO2: 24 mmol/L (ref 20–29)
Calcium: 9.9 mg/dL (ref 8.7–10.3)
Chloride: 96 mmol/L (ref 96–106)
Creatinine, Ser: 0.81 mg/dL (ref 0.57–1.00)
Globulin, Total: 2.8 g/dL (ref 1.5–4.5)
Glucose: 82 mg/dL (ref 70–99)
Potassium: 5.2 mmol/L (ref 3.5–5.2)
Sodium: 136 mmol/L (ref 134–144)
Total Protein: 7.4 g/dL (ref 6.0–8.5)
eGFR: 78 mL/min/{1.73_m2} (ref >59)

## 2022-06-11 LAB — CBC WITH DIFFERENTIAL/PLATELET
Basophils Absolute: 0 10*3/uL (ref 0.0–0.2)
Basos: 1 %
EOS (ABSOLUTE): 0.2 10*3/uL (ref 0.0–0.4)
Eos: 3 %
Hematocrit: 42 % (ref 34.0–46.6)
Hemoglobin: 14.2 g/dL (ref 11.1–15.9)
Immature Grans (Abs): 0 10*3/uL (ref 0.0–0.1)
Immature Granulocytes: 0 %
Lymphocytes Absolute: 1.6 10*3/uL (ref 0.7–3.1)
Lymphs: 28 %
MCH: 30.7 pg (ref 26.6–33.0)
MCHC: 33.8 g/dL (ref 31.5–35.7)
MCV: 91 fL (ref 79–97)
Monocytes Absolute: 0.5 10*3/uL (ref 0.1–0.9)
Monocytes: 8 %
Neutrophils Absolute: 3.4 10*3/uL (ref 1.4–7.0)
Neutrophils: 60 %
Platelets: 238 10*3/uL (ref 150–450)
RBC: 4.62 x10E6/uL (ref 3.77–5.28)
RDW: 12.3 % (ref 11.7–15.4)
WBC: 5.7 10*3/uL (ref 3.4–10.8)

## 2022-06-11 LAB — LIPID PANEL
Chol/HDL Ratio: 2.6 ratio (ref 0.0–4.4)
Cholesterol, Total: 198 mg/dL (ref 100–199)
HDL: 77 mg/dL (ref >39)
LDL Chol Calc (NIH): 107 mg/dL — ABNORMAL HIGH (ref 0–99)
Triglycerides: 78 mg/dL (ref 0–149)
VLDL Cholesterol Cal: 14 mg/dL (ref 5–40)

## 2022-06-30 DIAGNOSIS — R300 Dysuria: Secondary | ICD-10-CM | POA: Diagnosis not present

## 2022-06-30 DIAGNOSIS — R3915 Urgency of urination: Secondary | ICD-10-CM | POA: Diagnosis not present

## 2022-06-30 DIAGNOSIS — R102 Pelvic and perineal pain: Secondary | ICD-10-CM | POA: Diagnosis not present

## 2022-08-10 ENCOUNTER — Other Ambulatory Visit: Payer: Self-pay | Admitting: Physician Assistant

## 2022-08-10 DIAGNOSIS — N3010 Interstitial cystitis (chronic) without hematuria: Secondary | ICD-10-CM

## 2022-08-25 ENCOUNTER — Other Ambulatory Visit (HOSPITAL_COMMUNITY)
Admission: RE | Admit: 2022-08-25 | Discharge: 2022-08-25 | Disposition: A | Discharge status: Home / Self Care | Payer: Medicare Other | Source: Ambulatory Visit | Attending: Family Medicine | Admitting: Family Medicine

## 2022-08-25 ENCOUNTER — Ambulatory Visit: Payer: Medicare Other | Admitting: Family Medicine

## 2022-08-25 VITALS — BP 155/71 | HR 82 | Ht 64.5 in | Wt 122.2 lb

## 2022-08-25 DIAGNOSIS — N949 Unspecified condition associated with female genital organs and menstrual cycle: Secondary | ICD-10-CM | POA: Insufficient documentation

## 2022-08-25 DIAGNOSIS — N9489 Other specified conditions associated with female genital organs and menstrual cycle: Secondary | ICD-10-CM | POA: Insufficient documentation

## 2022-08-25 DIAGNOSIS — N898 Other specified noninflammatory disorders of vagina: Secondary | ICD-10-CM | POA: Diagnosis not present

## 2022-08-25 LAB — POCT URINALYSIS DIPSTICK
Bilirubin, UA: NEGATIVE
Blood, UA: NEGATIVE
Glucose, UA: NEGATIVE
Ketones, UA: NEGATIVE
Leukocytes, UA: NEGATIVE
Nitrite, UA: NEGATIVE
Protein, UA: NEGATIVE
Spec Grav, UA: 1.015 (ref 1.010–1.025)
Urobilinogen, UA: 0.2 E.U./dL
pH, UA: 7.5 (ref 5.0–8.0)

## 2022-08-25 MED ORDER — NYSTATIN-TRIAMCINOLONE 100000-0.1 UNIT/GM-% EX OINT
1.0000 | TOPICAL_OINTMENT | Freq: Two times a day (BID) | CUTANEOUS | 0 refills | Status: DC
Start: 2022-08-25 — End: 2022-11-30

## 2022-08-25 MED ORDER — PREMARIN 0.625 MG/GM VA CREA
TOPICAL_CREAM | VAGINAL | 12 refills | Status: DC
Start: 2022-08-25 — End: 2022-12-14

## 2022-08-25 NOTE — Progress Notes (Signed)
Established patient visit   Patient: [name]   DOB: [birth year]   72 y.o. Female  MRN: [id number] Visit Date: 08/25/2022  Today's healthcare provider: Jacky Kindle, FNP  Introduced to nurse practitioner role and practice setting.  All questions answered.  Discussed provider/patient relationship and expectations.  Subjective    Vaginal Itching   HPI     Vaginal Itching    Additional comments: Associated with vaginal burning starting yesterday. Patient reports using vagisal last night but she has not today. Reports her pain is a 7-8/10.       Last edited by Acey Lav, CMA on 08/25/2022 10:37 AM.      Medications: Outpatient Medications Prior to Visit  Medication Sig   amitriptyline (ELAVIL) 50 MG tablet Take 1 tablet (50 mg total) by mouth at bedtime.   Calcium Carbonate-Vitamin D 600-200 MG-UNIT CAPS Take 2 tablets by mouth daily.   Calcium Glycerophosphate (PRELIEF PO) Take 2 tablets by mouth daily. As needed   Cholecalciferol 125 MCG (5000 UT) TABS Take by mouth.   Flaxseed, Linseed, (FLAXSEED OIL PO) Take by mouth.   Lifitegrast (XIIDRA) 5 % SOLN Apply to eye.   MYRBETRIQ 50 MG TB24 tablet    pantoprazole (PROTONIX) 40 MG tablet Take 1 tablet (40 mg total) by mouth daily.   triamterene-hydrochlorothiazide (MAXZIDE-25) 37.5-25 MG tablet Take 1 tablet by mouth daily.   vitamin C (ASCORBIC ACID) 500 MG tablet Take 500 mg by mouth daily.   No facility-administered medications prior to visit.     Objective    BP (!) 155/71 (BP Location: Right Arm, Patient Position: Sitting, Cuff Size: Normal)   Pulse 82   Ht 5' 4.5" (1.638 m)   Wt 122 lb 3.2 oz (55.4 kg)   SpO2 100%   BMI 20.65 kg/m   Physical Exam Vitals and nursing note reviewed. Exam conducted with a chaperone present.  Constitutional:      General: She is not in acute distress.    Appearance: Normal appearance. She is normal weight. She is not ill-appearing, toxic-appearing or diaphoretic.  HENT:      Head: Normocephalic and atraumatic.  Cardiovascular:     Rate and Rhythm: Normal rate and regular rhythm.     Pulses: Normal pulses.     Heart sounds: Normal heart sounds. No murmur heard.    No friction rub. No gallop.  Pulmonary:     Effort: Pulmonary effort is normal. No respiratory distress.     Breath sounds: Normal breath sounds. No stridor. No wheezing, rhonchi or rales.  Chest:     Chest wall: No tenderness.  Genitourinary:    General: Normal vulva.     Pubic Area: Rash present.     Tanner stage (genital): 5.     Vagina: Normal.  Musculoskeletal:        General: No swelling, tenderness, deformity or signs of injury. Normal range of motion.     Right lower leg: No edema.     Left lower leg: No edema.  Skin:    General: Skin is warm and dry.     Capillary Refill: Capillary refill takes less than 2 seconds.     Coloration: Skin is not jaundiced or pale.     Findings: No bruising, erythema, lesion or rash.  Neurological:     General: No focal deficit present.     Mental Status: She is alert and oriented to person, place, and time. Mental status is at baseline.  Cranial Nerves: No cranial nerve deficit.     Sensory: No sensory deficit.     Motor: No weakness.     Coordination: Coordination normal.  Psychiatric:        Mood and Affect: Mood normal.        Behavior: Behavior normal.        Thought Content: Thought content normal.        Judgment: Judgment normal.      Results for orders placed or performed in visit on 08/25/22  POCT Urinalysis Dipstick  Result Value Ref Range   Color, UA     Clarity, UA     Glucose, UA Negative Negative   Bilirubin, UA negative    Ketones, UA negative    Spec Grav, UA 1.015 1.010 - 1.025   Blood, UA negative    pH, UA 7.5 5.0 - 8.0   Protein, UA Negative Negative   Urobilinogen, UA 0.2 0.2 or 1.0 E.U./dL   Nitrite, UA negative    Leukocytes, UA Negative Negative   Appearance     Odor      Assessment & Plan      Problem List Items Addressed This Visit       Genitourinary   Vaginal itching - Primary    Acute, irritating Denies new soaps, detergents etc No known allergen exposures Low suspicion for STI Normal pelvic exam Recommend use of combination of steroid/antifungal with plan to use estrogen cream as needed if not improved UA negative Vaginal swab collected      Relevant Medications   nystatin-triamcinolone ointment (MYCOLOG)   Other Relevant Orders   POCT Urinalysis Dipstick (Completed)   Cervicovaginal ancillary only     Other   Vaginal burning    Acute, irritating Denies new soaps, detergents etc No known allergen exposures Low suspicion for STI Normal pelvic exam Recommend use of combination of steroid/antifungal with plan to use estrogen cream as needed if not improved UA negative Vaginal swab collected      Relevant Medications   nystatin-triamcinolone ointment (MYCOLOG)   conjugated estrogens (PREMARIN) vaginal cream   Other Relevant Orders   POCT Urinalysis Dipstick (Completed)   Cervicovaginal ancillary only   No follow-ups on file.     Leilani Merl, FNP, have reviewed all documentation for this visit. The documentation on 08/25/22 for the exam, diagnosis, procedures, and orders are all accurate and complete.  Jacky Kindle, FNP  [facility] [phone] (phone) [phone] (fax)  [facility]

## 2022-08-25 NOTE — Assessment & Plan Note (Signed)
Acute, irritating Denies new soaps, detergents etc No known allergen exposures Low suspicion for STI Normal pelvic exam Recommend use of combination of steroid/antifungal with plan to use estrogen cream as needed if not improved UA negative Vaginal swab collected

## 2022-08-26 ENCOUNTER — Ambulatory Visit
Admission: EM | Admit: 2022-08-26 | Discharge: 2022-08-26 | Disposition: A | Discharge status: Home / Self Care | Payer: Medicare Other | Attending: Emergency Medicine | Admitting: Emergency Medicine

## 2022-08-26 ENCOUNTER — Telehealth: Payer: Self-pay | Admitting: Emergency Medicine

## 2022-08-26 ENCOUNTER — Ambulatory Visit (INDEPENDENT_AMBULATORY_CARE_PROVIDER_SITE_OTHER): Payer: Medicare Other

## 2022-08-26 DIAGNOSIS — M25569 Pain in unspecified knee: Secondary | ICD-10-CM | POA: Insufficient documentation

## 2022-08-26 DIAGNOSIS — S8002XA Contusion of left knee, initial encounter: Secondary | ICD-10-CM | POA: Diagnosis not present

## 2022-08-26 DIAGNOSIS — M25561 Pain in right knee: Secondary | ICD-10-CM

## 2022-08-26 DIAGNOSIS — S8991XA Unspecified injury of right lower leg, initial encounter: Secondary | ICD-10-CM | POA: Diagnosis not present

## 2022-08-26 NOTE — ED Triage Notes (Addendum)
Patient to Urgent Care with complaints of bilateral lower leg pain after a mechanical, ground level fall today. Reports that she tripped in her living room today and fell directly onto her knees. Significant bruising to knee caps.   Able to ambulate without difficulty, full ROM on left leg. Reports more pain on right knee w/ bending. Advil PTA.

## 2022-08-26 NOTE — Discharge Instructions (Addendum)
You were evaluated for your knee pain  X-rays pending, you will be notified of test results via telephone  Knee sleeve has been applied to be used for stability and support when completing activity  For the next 24 to 48 hours recommend ice over the affected area stand 10 to 15-minute intervals to help reduce swelling and for bruising then you may switch over to heat if you find it more comforting  Whenever sitting and lying place pillows underneath the knees to help reduce swelling  You may continue use of Advil 600 mg every 6 hours for pain, may take Tylenol 500 mg in addition to this as needed  If you are still having discomfort in 2 weeks please follow-up for reevaluation

## 2022-08-26 NOTE — Telephone Encounter (Signed)
Reported right knee x-ray results to patient via telephone, 2 identifiers used, negative for injury to the bone, continue treatment plan as discussed

## 2022-08-26 NOTE — Progress Notes (Signed)
Negative for yeast.

## 2022-08-26 NOTE — ED Provider Notes (Signed)
[name]    CSN: [id number] Arrival date & time: 08/26/22  1143      History   Chief Complaint Chief Complaint  Patient presents with   Leg Pain    HPI [name] is a 72 y.o. female.   Patient presents for evaluation of right knee pain and swelling beginning today after fall.  Has bruising and swelling to the left knee but no pain elicited.  Got caught up in the nightstand, setting her to trip landing directly onto the knees hitting the hardwood floor.  Right knee has generalized swelling and pain exacerbated when bending the knee.  Able to bear weight and complete range of motion bilaterally.  Has attempted use of Advil with no improvement.  Denies use of blood thinners.  Past Medical History:  Diagnosis Date   Complication of anesthesia    GERD (gastroesophageal reflux disease)    Hyperlipidemia    Hypertension    IC (interstitial cystitis)    PONV (postoperative nausea and vomiting)    NO PROBLEM     4/18 WITH RETINAL SURGERY    Patient Active Problem List   Diagnosis Date Noted   Knee pain 08/26/2022   Vaginal itching 08/25/2022   Vaginal burning 08/25/2022   Otosclerosis of right ear 08/14/2019   Hyperlipidemia    Age-related osteoporosis without current pathological fracture 06/03/2017   History of right cataract surgery 09/29/2016   Chest pain on exertion 05/28/2016   Right retinal detachment 05/01/2016   Varicose veins of both lower extremities with inflammation 11/21/2015   Chronic venous insufficiency 11/21/2015   Pain in limb 11/21/2015   GERD (gastroesophageal reflux disease) 11/23/2014   Allergic rhinitis 07/17/2014   History of tobacco use 02/26/2006   Interstitial cystitis 02/22/2006   Abdominal hernia with gangrene and obstruction 02/22/2006   Essential hypertension 04/22/1999    Past Surgical History:  Procedure Laterality Date   BLADDER SUSPENSION     TRANSVAGINAL SLING   BUNIONECTOMY  12/02/2017   CATARACT EXTRACTION W/PHACO  Right 09/29/2016   Procedure: CATARACT EXTRACTION PHACO AND INTRAOCULAR LENS PLACEMENT (IOC);  Surgeon: Galen Manila, MD;  Location: [facility] ORS;  Service: Ophthalmology;  Laterality: Right;  [place] 00:47 AP% 21.6 CDE 10.26 fluid pack lot # [id number]   CHOLECYSTECTOMY     EYE SURGERY     INSERTION OF MESH  2008   Trans vaginal mesh   LASER ABLATION Right 11/21/2015   [facility] Dr. Gilda Crease    OB History     Gravida  2   Para  2   Term  2   Preterm      AB      Living  2      SAB      IAB      Ectopic      Multiple      Live Births  2            Home Medications    Prior to Admission medications   Medication Sig Start Date End Date Taking? Authorizing Provider  amitriptyline (ELAVIL) 50 MG tablet Take 1 tablet (50 mg total) by mouth at bedtime. 08/13/22   Sherlyn Hay, DO  Calcium Carbonate-Vitamin D 600-200 MG-UNIT CAPS Take 2 tablets by mouth daily. 07/15/07   [provider]  Calcium Glycerophosphate (PRELIEF PO) Take 2 tablets by mouth daily. As needed    [provider]  Cholecalciferol 125 MCG (5000 UT) TABS Take by mouth.  12/01/17   [provider]  conjugated estrogens (PREMARIN) vaginal cream 3x/week to assist with vaginal dryness and irritation 08/25/22   Merita Norton T, FNP  Flaxseed, Linseed, (FLAXSEED OIL PO) Take by mouth.    [provider]  Lifitegrast Benay Spice) 5 % SOLN Apply to eye.    [provider]  MYRBETRIQ 50 MG TB24 tablet  06/03/17   [provider]  nystatin-triamcinolone ointment (MYCOLOG) Apply 1 Application topically 2 (two) times daily. 08/25/22   Jacky Kindle, FNP  pantoprazole (PROTONIX) 40 MG tablet Take 1 tablet (40 mg total) by mouth daily. 03/03/22   Alfredia Ferguson, PA-C  triamterene-hydrochlorothiazide (MAXZIDE-25) 37.5-25 MG tablet Take 1 tablet by mouth daily. 06/10/22   Alfredia Ferguson, PA-C  vitamin C (ASCORBIC ACID) 500 MG tablet Take 500 mg by mouth  daily.    [provider]    Family History Family History  Problem Relation Age of Onset   Congestive Heart Failure Mother    Cancer Father        Bladder   Bladder Cancer Father    Hypertension Father    Coronary artery disease Father        Stent Placed 1990   Liver disease Father    Hypertension Sister    Lung cancer Sister    Breast cancer Paternal Aunt        95s   Healthy Son    Healthy Son     Social History Social History   Tobacco Use   Smoking status: Former    Current packs/day: 0.00    Average packs/day: 1 pack/day for 35.0 years (35.0 ttl pk-yrs)    Types: Cigarettes    Start date: 01/19/1961    Quit date: 01/20/1996    Years since quitting: 26.6   Smokeless tobacco: Never  Vaping Use   Vaping status: Never Used  Substance Use Topics   Alcohol use: No   Drug use: No     Allergies   Doxycycline, Penicillins, Pneumococcal 13-val conj vacc, and Sulfa antibiotics   Review of Systems Review of Systems Defer to HPI    Physical Exam Triage Vital Signs ED Triage Vitals  Encounter Vitals Group     BP --      Systolic BP Percentile --      Diastolic BP Percentile --      Pulse Rate 08/26/22 1232 76     Resp 08/26/22 1232 16     Temp 08/26/22 1232 98 F (36.7 C)     Temp Source 08/26/22 1232 Oral     SpO2 08/26/22 1232 98 %     Weight --      Height --      Head Circumference --      Peak Flow --      Pain Score 08/26/22 1238 5     Pain Loc --      Pain Education --      Exclude from Growth Chart --    No data found.  Updated Vital Signs Pulse 76   Temp 98 F (36.7 C) (Oral)   Resp 16   SpO2 98%   Visual Acuity Right Eye Distance:   Left Eye Distance:   Bilateral Distance:    Right Eye Near:   Left Eye Near:    Bilateral Near:     Physical Exam Constitutional:      Appearance: Normal appearance.  Eyes:     Extraocular Movements: Extraocular movements intact.  Pulmonary:     Effort: Pulmonary effort is normal.   Musculoskeletal:       Legs:     Comments: Severe contusion present to the anterior medial of the left knee above the patella, tender, 2+ popliteal pulse, able to extend and flex, able to bear weight  Moderate to severe generalized swelling and tenderness to the right knee with ecchymosis, 2+ popliteal pulse, able to extend, pain elicited with flexion, able to bear weight  Neurological:     Mental Status: She is alert and oriented to person, place, and time. Mental status is at baseline.      UC Treatments / Results  Labs (all labs ordered are listed, but only abnormal results are displayed) Labs Reviewed - No data to display  EKG   Radiology No results found.  Procedures Procedures (including critical care time)  Medications Ordered in UC Medications - No data to display  Initial Impression / Assessment and Plan / UC Course  I have reviewed the triage vital signs and the nursing notes.  Pertinent labs & imaging results that were available during my care of the patient were reviewed by me and considered in my medical decision making (see chart for details).  Acute pain of the right knee, contusion of the left knee, initial encounter  Right knee X-ray pending, knee knee sleeve applied by nursing staff to be used as needed for stability and support, recommended continue use of ibuprofen as it has been helpful, may use Tylenol additionally, may continue activity as tolerated, recommend ice for the next 24 to 48 hours then may switch over to heat, recommend elevation, may continue activity as tolerated if symptoms persist past 2 weeks advised reevaluation Final Clinical Impressions(s) / UC Diagnoses   Final diagnoses:  Acute pain of right knee   Discharge Instructions   None    ED Prescriptions   None    PDMP not reviewed this encounter.   Valinda Hoar, NP 08/26/22 1357

## 2022-09-11 DIAGNOSIS — S0502XA Injury of conjunctiva and corneal abrasion without foreign body, left eye, initial encounter: Secondary | ICD-10-CM | POA: Diagnosis not present

## 2022-09-15 DIAGNOSIS — S0502XD Injury of conjunctiva and corneal abrasion without foreign body, left eye, subsequent encounter: Secondary | ICD-10-CM | POA: Diagnosis not present

## 2022-09-28 ENCOUNTER — Ambulatory Visit (INDEPENDENT_AMBULATORY_CARE_PROVIDER_SITE_OTHER): Payer: Medicare Other

## 2022-09-28 VITALS — BP 126/78 | Ht 64.5 in | Wt 123.0 lb

## 2022-09-28 DIAGNOSIS — Z0000 Encounter for general adult medical examination without abnormal findings: Secondary | ICD-10-CM | POA: Diagnosis not present

## 2022-09-28 NOTE — Patient Instructions (Signed)
[name] , Thank you for taking time to come for your Medicare Wellness Visit. I appreciate your ongoing commitment to your health goals. Please review the following plan we discussed and let me know if I can assist you in the future.   Referrals/Orders/Follow-Ups/Clinician Recommendations: PE w/PCP scheduled  This is a list of the screening recommended for you and due dates:  Health Maintenance  Topic Date Due   Pneumonia Vaccine (2 of 2 - PPSV23 or PCV20) 06/04/2018   DTaP/Tdap/Td vaccine (2 - Td or Tdap) 10/09/2020   COVID-19 Vaccine (2 - 2023-24 season) 09/20/2022   Flu Shot  04/19/2023*   Mammogram  03/03/2023   Medicare Annual Wellness Visit  09/28/2023   DEXA scan (bone density measurement)  Completed   Hepatitis C Screening  Completed   Zoster (Shingles) Vaccine  Completed   HPV Vaccine  Aged Out   Colon Cancer Screening  Discontinued   Cologuard (Stool DNA test)  Discontinued  *Topic was postponed. The date shown is not the original due date.    Advanced directives: (Copy Requested) Please bring a copy of your health care power of attorney and living will to the office to be added to your chart at your convenience.  Next Medicare Annual Wellness Visit scheduled for next year: Yes 09/29/23 @ 8:15am in person

## 2022-09-28 NOTE — Progress Notes (Signed)
Subjective:   [name] is a 72 y.o. female who presents for Medicare Annual (Subsequent) preventive examination.  Visit Complete: In person  Patient Medicare AWV questionnaire was completed by the patient on 09/24/22; I have confirmed that all information answered by patient is correct and no changes since this date.  Review of Systems         Objective:    Today's Vitals   09/28/22 0817  BP: 126/78  Weight: 123 lb (55.8 kg)  Height: 5' 4.5" (1.638 m)   Body mass index is 20.79 kg/m.     09/28/2022    8:27 AM 08/26/2022   12:39 PM 09/23/2021    8:25 AM 12/23/2015    9:02 AM  Advanced Directives  Does Patient Have a Medical Advance Directive? Yes Yes No Yes  Type of Estate agent of [place];Living will Healthcare Power of [place];Living will  Healthcare Power of [place];Living will  Would patient like information on creating a medical advance directive?   No - Patient declined     Current Medications (verified) Outpatient Encounter Medications as of 09/28/2022  Medication Sig   amitriptyline (ELAVIL) 50 MG tablet Take 1 tablet (50 mg total) by mouth at bedtime.   Calcium Carbonate-Vitamin D 600-200 MG-UNIT CAPS Take 2 tablets by mouth daily.   Calcium Glycerophosphate (PRELIEF PO) Take 2 tablets by mouth daily. As needed   Cholecalciferol 125 MCG (5000 UT) TABS Take by mouth.   conjugated estrogens (PREMARIN) vaginal cream 3x/week to assist with vaginal dryness and irritation   Flaxseed, Linseed, (FLAXSEED OIL PO) Take by mouth.   Lifitegrast (XIIDRA) 5 % SOLN Apply to eye.   MYRBETRIQ 50 MG TB24 tablet    nystatin-triamcinolone ointment (MYCOLOG) Apply 1 Application topically 2 (two) times daily.   pantoprazole (PROTONIX) 40 MG tablet Take 1 tablet (40 mg total) by mouth daily.   triamterene-hydrochlorothiazide (MAXZIDE-25) 37.5-25 MG tablet Take 1 tablet by mouth daily.   vitamin C (ASCORBIC ACID) 500 MG tablet Take 500 mg by mouth daily.   No  facility-administered encounter medications on file as of 09/28/2022.    Allergies (verified) Doxycycline, Penicillins, Pneumococcal 13-val conj vacc, and Sulfa antibiotics   History: Past Medical History:  Diagnosis Date   Complication of anesthesia    GERD (gastroesophageal reflux disease)    Hyperlipidemia    Hypertension    IC (interstitial cystitis)    PONV (postoperative nausea and vomiting)    NO PROBLEM     4/18 WITH RETINAL SURGERY   Past Surgical History:  Procedure Laterality Date   BLADDER SUSPENSION     TRANSVAGINAL SLING   BUNIONECTOMY  12/02/2017   CATARACT EXTRACTION W/PHACO Right 09/29/2016   Procedure: CATARACT EXTRACTION PHACO AND INTRAOCULAR LENS PLACEMENT (IOC);  Surgeon: Galen Manila, MD;  Location: [facility] ORS;  Service: Ophthalmology;  Laterality: Right;  [place] 00:47 AP% 21.6 CDE 10.26 fluid pack lot # [id number]   CHOLECYSTECTOMY     EYE SURGERY     INSERTION OF MESH  2008   Trans vaginal mesh   LASER ABLATION Right 11/21/2015   [facility] Dr. Gilda Crease   Family History  Problem Relation Age of Onset   Congestive Heart Failure Mother    Cancer Father        Bladder   Bladder Cancer Father    Hypertension Father    Coronary artery disease Father        Stent Placed 1990   Liver disease Father  Hypertension Sister    Lung cancer Sister    Breast cancer Paternal Aunt        37s   Healthy Son    Healthy Son    Social History   Socioeconomic History   Marital status: Married    Spouse name: Not on file   Number of children: 2   Years of education: H/S   Highest education level: GED or equivalent  Occupational History   Occupation: Full-time  Tobacco Use   Smoking status: Former    Current packs/day: 0.00    Average packs/day: 1 pack/day for 35.0 years (35.0 ttl pk-yrs)    Types: Cigarettes    Start date: 01/19/1961    Quit date: 01/20/1996    Years since quitting: 26.7   Smokeless tobacco: Never  Vaping Use   Vaping  status: Never Used  Substance and Sexual Activity   Alcohol use: No   Drug use: No   Sexual activity: Not Currently    Birth control/protection: Post-menopausal  Other Topics Concern   Not on file  Social History Narrative   Not on file   Social Determinants of Health   Financial Resource Strain: Low Risk  (09/24/2022)   Overall Financial Resource Strain (CARDIA)    Difficulty of Paying Living Expenses: Not hard at all  Food Insecurity: No Food Insecurity (09/24/2022)   Hunger Vital Sign    Worried About Running Out of Food in the Last Year: Never true    Ran Out of Food in the Last Year: Never true  Transportation Needs: No Transportation Needs (09/24/2022)   PRAPARE - Administrator, Civil Service (Medical): No    Lack of Transportation (Non-Medical): No  Physical Activity: Sufficiently Active (09/24/2022)   Exercise Vital Sign    Days of Exercise per Week: 5 days    Minutes of Exercise per Session: 30 min  Stress: No Stress Concern Present (09/24/2022)   [employer] of Occupational Health - Occupational Stress Questionnaire    Feeling of Stress : Only a little  Social Connections: Socially Integrated (09/24/2022)   Social Connection and Isolation Panel [NHANES]    Frequency of Communication with Friends and Family: More than three times a week    Frequency of Social Gatherings with Friends and Family: More than three times a week    Attends Religious Services: More than 4 times per year    Active Member of [employer] or Organizations: Yes    Attends Engineer, structural: More than 4 times per year    Marital Status: Married    Tobacco Counseling Counseling given: Not Answered   Clinical Intake:  Pre-visit preparation completed: Yes  Pain : No/denies pain     BMI - recorded: 20.79 Nutritional Status: BMI of 19-24  Normal Nutritional Risks: None Diabetes: No  How often do you need to have someone help you when you read instructions, pamphlets, or  other written materials from your doctor or pharmacy?: 1 - Never  Interpreter Needed?: No  Comments: lives with husband Information entered by :: B.Rylei Codispoti,LPN   Activities of Daily Living    09/24/2022    5:11 PM 06/10/2022    8:54 AM  In your present state of health, do you have any difficulty performing the following activities:  Hearing? 0 0  Vision? 0 0  Difficulty concentrating or making decisions? 0 0  Walking or climbing stairs? 0 0  Dressing or bathing? 0 0  Doing errands, shopping? 0 0  Preparing Food and eating ? N   Using the Toilet? N   In the past six months, have you accidently leaked urine? N   Do you have problems with loss of bowel control? N   Managing your Medications? N   Managing your Finances? N   Housekeeping or managing your Housekeeping? N     Patient Care Team: Jacky Kindle, FNP as PCP - General (Family Medicine) Galen Manila, MD as Referring Physician (Ophthalmology)  Indicate any recent Medical Services you may have received from other than [facility] providers in the past year (date may be approximate).     Assessment:   This is a routine wellness examination for [name].  Hearing/Vision screen Hearing Screening - Comments:: Adequate hearing Vision Screening - Comments:: Adequate vision; glasses readers and driving   Goals Addressed             This Visit's Progress    DIET - EAT MORE FRUITS AND VEGETABLES   On track    Exercise 150 minutes per week (moderate activity)   On track      Depression Screen    09/28/2022    8:23 AM 08/25/2022   10:34 AM 06/10/2022    8:53 AM 12/10/2021    8:08 AM 09/23/2021    8:23 AM 07/02/2021    9:03 AM 09/12/2020    9:01 AM  PHQ 2/9 Scores  PHQ - 2 Score 0 0 0 0 0 0 0  PHQ- 9 Score   1 0 0 0 0    Fall Risk    09/24/2022    5:11 PM 08/25/2022   10:34 AM 06/10/2022    8:53 AM 12/10/2021    8:08 AM 09/23/2021    8:26 AM  Fall Risk   Falls in the past year? 1 0 0 0 0  Number falls in past yr: 0  0 0  0  Injury with Fall? 1 0 0 0 0  Risk for fall due to : No Fall Risks No Fall Risks No Fall Risks No Fall Risks No Fall Risks  Follow up Education provided;Falls prevention discussed Falls evaluation completed Falls evaluation completed Falls evaluation completed Falls evaluation completed    MEDICARE RISK AT HOME: Medicare Risk at Home Any stairs in or around the home?: No Home free of loose throw rugs in walkways, pet beds, electrical cords, etc?: Yes Adequate lighting in your home to reduce risk of falls?: Yes Life alert?: No Use of a cane, walker or w/c?: No Grab bars in the bathroom?: No Shower chair or bench in shower?: Yes Elevated toilet seat or a handicapped toilet?: No  TIMED UP AND GO:  Was the test performed?  Yes  Length of time to ambulate 10 feet: 8 sec Gait steady and fast without use of assistive device    Cognitive Function:        09/28/2022    8:29 AM 09/23/2021    8:35 AM 06/03/2017   11:01 AM  6CIT Screen  What Year? 0 points 0 points 0 points  What month? 0 points 0 points 0 points  What time? 0 points 0 points 0 points  Count back from 20 0 points 0 points 0 points  Months in reverse 0 points 0 points 0 points  Repeat phrase 0 points 0 points 0 points  Total Score 0 points 0 points 0 points    Immunizations Immunization History  Administered Date(s) Administered   H1N1 12/22/2007   Influenza Split  11/19/2005, 10/07/2009, 10/10/2010   Influenza, High Dose Seasonal PF 10/28/2020   Influenza,inj,Quad PF,6+ Mos 10/20/2014   Influenza-Unspecified 10/15/2015, 10/18/2017, 12/15/2021   Janssen (J&J) SARS-COV-2 Vaccination 06/02/2019   Pneumococcal Conjugate-13 06/03/2017   Tdap 10/10/2010   Zoster Recombinant(Shingrix) 05/28/2021, 09/10/2021   Zoster, Live 02/16/2015    TDAP status: Up to date  Flu Vaccine status: Up to date  Pneumococcal vaccine status: Up to date  Covid-19 vaccine status: Completed vaccines  Qualifies for Shingles Vaccine?  Yes   Zostavax completed Yes   Shingrix Completed?: Yes  Screening Tests Health Maintenance  Topic Date Due   Pneumonia Vaccine 11+ Years old (2 of 2 - PPSV23 or PCV20) 06/04/2018   DTaP/Tdap/Td (2 - Td or Tdap) 10/09/2020   COVID-19 Vaccine (2 - 2023-24 season) 09/20/2022   INFLUENZA VACCINE  04/19/2023 (Originally 08/20/2022)   MAMMOGRAM  03/03/2023   Medicare Annual Wellness (AWV)  09/28/2023   DEXA SCAN  Completed   Hepatitis C Screening  Completed   Zoster Vaccines- Shingrix  Completed   HPV VACCINES  Aged Out   Colonoscopy  Discontinued   Fecal DNA (Cologuard)  Discontinued    Health Maintenance  Health Maintenance Due  Topic Date Due   Pneumonia Vaccine 30+ Years old (2 of 2 - PPSV23 or PCV20) 06/04/2018   DTaP/Tdap/Td (2 - Td or Tdap) 10/09/2020   COVID-19 Vaccine (2 - 2023-24 season) 09/20/2022    Colorectal cancer screening: Type of screening: Colonoscopy. Completed yes. Repeat every 5-10 years  Mammogram status: Completed yes. Repeat every year  Bone Density status: Completed yes. Results reflect: Bone density results: NORMAL. Repeat every 5-10 years.  Lung Cancer Screening: (Low Dose CT Chest recommended if Age 18-80 years, 20 pack-year currently smoking OR have quit w/in 15years.) does not qualify.   Lung Cancer Screening Referral: no  Additional Screening:  Hepatitis C Screening: does not qualify; Completed yes  Vision Screening: Recommended annual ophthalmology exams for early detection of glaucoma and other disorders of the eye. Is the patient up to date with their annual eye exam?  Yes  Who is the provider or what is the name of the office in which the patient attends annual eye exams? Dr Druscilla Brownie If pt is not established with a provider, would they like to be referred to a provider to establish care? No .   Dental Screening: Recommended annual dental exams for proper oral hygiene  Diabetic Foot Exam: n/a  Community Resource Referral / Chronic Care  Management: CRR required this visit?  No   CCM required this visit?  No    Plan:     I have personally reviewed and noted the following in the patient's chart:   Medical and social history Use of alcohol, tobacco or illicit drugs  Current medications and supplements including opioid prescriptions. Patient is not currently taking opioid prescriptions. Functional ability and status Nutritional status Physical activity Advanced directives List of other physicians Hospitalizations, surgeries, and ER visits in previous 12 months Vitals Screenings to include cognitive, depression, and falls Referrals and appointments  In addition, I have reviewed and discussed with patient certain preventive protocols, quality metrics, and best practice recommendations. A written personalized care plan for preventive services as well as general preventive health recommendations were provided to patient.    Sue Lush, LPN   4/0/9811   After Visit Summary: (MyChart) Due to this being a telephonic visit, the after visit summary with patients personalized plan was offered to patient via MyChart  Nurse Notes: The patient states she is doing well and has no concerns or questions at this time.

## 2022-09-28 NOTE — Addendum Note (Signed)
Addended by: Sue Lush on: 09/28/2022 01:24 PM   Modules accepted: Level of Service

## 2022-11-16 ENCOUNTER — Other Ambulatory Visit: Payer: Self-pay | Admitting: Physician Assistant

## 2022-11-16 DIAGNOSIS — N3010 Interstitial cystitis (chronic) without hematuria: Secondary | ICD-10-CM

## 2022-11-30 ENCOUNTER — Other Ambulatory Visit: Payer: Self-pay | Admitting: Family Medicine

## 2022-11-30 DIAGNOSIS — N898 Other specified noninflammatory disorders of vagina: Secondary | ICD-10-CM

## 2022-11-30 DIAGNOSIS — N9489 Other specified conditions associated with female genital organs and menstrual cycle: Secondary | ICD-10-CM

## 2022-11-30 MED ORDER — NYSTATIN-TRIAMCINOLONE 100000-0.1 UNIT/GM-% EX OINT: 1.0000 | TOPICAL_OINTMENT | Freq: Two times a day (BID) | CUTANEOUS | 0 refills | Status: DC

## 2022-12-01 ENCOUNTER — Encounter: Payer: Self-pay | Admitting: Family Medicine

## 2022-12-02 ENCOUNTER — Other Ambulatory Visit: Payer: Self-pay | Admitting: Family Medicine

## 2022-12-11 ENCOUNTER — Ambulatory Visit: Payer: Medicare Other | Admitting: Family Medicine

## 2022-12-11 ENCOUNTER — Ambulatory Visit: Payer: Medicare Other | Admitting: Physician Assistant

## 2022-12-14 ENCOUNTER — Encounter: Payer: Self-pay | Admitting: Family Medicine

## 2022-12-14 ENCOUNTER — Ambulatory Visit (INDEPENDENT_AMBULATORY_CARE_PROVIDER_SITE_OTHER): Payer: Medicare Other | Admitting: Family Medicine

## 2022-12-14 VITALS — BP 145/78 | HR 77 | Ht 64.5 in | Wt 124.0 lb

## 2022-12-14 DIAGNOSIS — I10 Essential (primary) hypertension: Secondary | ICD-10-CM | POA: Diagnosis not present

## 2022-12-14 NOTE — Progress Notes (Signed)
Established patient visit   Patient: [name]   DOB: July 18, 1950   72 y.o. Female  MRN: [id number] Visit Date: 12/14/2022  Today's healthcare provider: Jacky Kindle, FNP  Re Introduced to nurse practitioner role and practice setting.  All questions answered.  Discussed provider/patient relationship and expectations.  Subjective    HPI   Presents for a 3 month f/u from last acute visit for elevated BP.  Hypertension, follow-up  BP Readings from Last 3 Encounters:  12/14/22 (!) 145/78  09/28/22 126/78  08/26/22 (!) 186/99   Wt Readings from Last 3 Encounters:  12/14/22 124 lb (56.2 kg)  09/28/22 123 lb (55.8 kg)  08/25/22 122 lb 3.2 oz (55.4 kg)     She was last seen for hypertension 3 months ago.  BP at that visit was 186/99. Management since that visit includes low sodium diet, exercise, use of Maxzide.  She reports excellent compliance with treatment. She is not having side effects.  She is following a Regular diet. She is not exercising. She does not smoke.  Use of agents associated with hypertension: none.   Outside blood pressures are not being checked. Reports she has an arm monitor and could check her BP. Symptoms: No chest pain No chest pressure  No palpitations No syncope  No dyspnea No orthopnea  No paroxysmal nocturnal dyspnea No lower extremity edema   Pertinent labs Lab Results  Component Value Date   CHOL 198 06/10/2022   HDL 77 06/10/2022   LDLCALC 107 (H) 06/10/2022   TRIG 78 06/10/2022   CHOLHDL 2.6 06/10/2022   Lab Results  Component Value Date   NA 136 06/10/2022   K 5.2 06/10/2022   CREATININE 0.81 06/10/2022   EGFR 78 06/10/2022   GLUCOSE 82 06/10/2022   TSH 2.520 09/17/2020     The 10-year ASCVD risk score (Arnett DK, et al., 2019) is: 14.9%  ---------------------------------------------------------------------------------------------------  Medications: Outpatient Medications Prior to Visit  Medication Sig    amitriptyline (ELAVIL) 50 MG tablet Take 1 tablet (50 mg total) by mouth at bedtime.   Calcium Carbonate-Vitamin D 600-200 MG-UNIT CAPS Take 2 tablets by mouth daily.   Calcium Glycerophosphate (PRELIEF PO) Take 2 tablets by mouth daily. As needed   Cholecalciferol 125 MCG (5000 UT) TABS Take by mouth.   Flaxseed, Linseed, (FLAXSEED OIL PO) Take by mouth.   Lifitegrast (XIIDRA) 5 % SOLN Apply to eye.   MYRBETRIQ 50 MG TB24 tablet    nystatin-triamcinolone ointment (MYCOLOG) Apply 1 Application topically 2 (two) times daily.   pantoprazole (PROTONIX) 40 MG tablet Take 1 tablet (40 mg total) by mouth daily.   triamterene-hydrochlorothiazide (MAXZIDE-25) 37.5-25 MG tablet Take 1 tablet by mouth daily.   vitamin C (ASCORBIC ACID) 500 MG tablet Take 500 mg by mouth daily.   [DISCONTINUED] conjugated estrogens (PREMARIN) vaginal cream 3x/week to assist with vaginal dryness and irritation   No facility-administered medications prior to visit.    Review of Systems Last CBC Lab Results  Component Value Date   WBC 5.7 06/10/2022   HGB 14.2 06/10/2022   HCT 42.0 06/10/2022   MCV 91 06/10/2022   MCH 30.7 06/10/2022   RDW 12.3 06/10/2022   PLT 238 06/10/2022   Last metabolic panel Lab Results  Component Value Date   GLUCOSE 82 06/10/2022   NA 136 06/10/2022   K 5.2 06/10/2022   CL 96 06/10/2022   CO2 24 06/10/2022   BUN 13 06/10/2022  CREATININE 0.81 06/10/2022   EGFR 78 06/10/2022   CALCIUM 9.9 06/10/2022   PROT 7.4 06/10/2022   ALBUMIN 4.6 06/10/2022   LABGLOB 2.8 06/10/2022   AGRATIO 1.6 06/10/2022   BILITOT 0.3 06/10/2022   ALKPHOS 78 06/10/2022   AST 20 06/10/2022   ALT 18 06/10/2022   Last lipids Lab Results  Component Value Date   CHOL 198 06/10/2022   HDL 77 06/10/2022   LDLCALC 107 (H) 06/10/2022   TRIG 78 06/10/2022   CHOLHDL 2.6 06/10/2022   Last hemoglobin A1c Lab Results  Component Value Date   HGBA1C 5.2 11/30/2016   Last thyroid functions Lab Results   Component Value Date   TSH 2.520 09/17/2020        Objective    BP (!) 145/78 (BP Location: Right Arm, Patient Position: Sitting, Cuff Size: Normal)   Pulse 77   Ht 5' 4.5" (1.638 m)   Wt 124 lb (56.2 kg)   BMI 20.96 kg/m  BP Readings from Last 3 Encounters:  12/14/22 (!) 145/78  09/28/22 126/78  08/26/22 (!) 186/99   Wt Readings from Last 3 Encounters:  12/14/22 124 lb (56.2 kg)  09/28/22 123 lb (55.8 kg)  08/25/22 122 lb 3.2 oz (55.4 kg)   Physical Exam Vitals and nursing note reviewed.  Constitutional:      General: She is not in acute distress.    Appearance: Normal appearance. She is normal weight. She is not ill-appearing, toxic-appearing or diaphoretic.  HENT:     Head: Normocephalic and atraumatic.  Cardiovascular:     Rate and Rhythm: Normal rate and regular rhythm.     Pulses: Normal pulses.     Heart sounds: Normal heart sounds. No murmur heard.    No friction rub. No gallop.  Pulmonary:     Effort: Pulmonary effort is normal. No respiratory distress.     Breath sounds: Normal breath sounds. No stridor. No wheezing, rhonchi or rales.  Chest:     Chest wall: No tenderness.  Musculoskeletal:        General: No swelling, tenderness, deformity or signs of injury. Normal range of motion.     Right lower leg: No edema.     Left lower leg: No edema.  Skin:    General: Skin is warm and dry.     Capillary Refill: Capillary refill takes less than 2 seconds.     Coloration: Skin is not jaundiced or pale.     Findings: No bruising, erythema, lesion or rash.  Neurological:     General: No focal deficit present.     Mental Status: She is alert and oriented to person, place, and time. Mental status is at baseline.     Cranial Nerves: No cranial nerve deficit.     Sensory: No sensory deficit.     Motor: No weakness.     Coordination: Coordination normal.  Psychiatric:        Mood and Affect: Mood normal.        Behavior: Behavior normal.        Thought Content:  Thought content normal.        Judgment: Judgment normal.     No results found for any visits on 12/14/22.  Assessment & Plan     Problem List Items Addressed This Visit       Cardiovascular and Mediastinum   Essential hypertension - Primary    Chronic, remains elevated Home numbers not checked Discussed elevated ASCVD at 15% The  10-year ASCVD risk score (Arnett DK, et al., 2019) is: 14.9% Declines use of additional medication today to assist Currently on Maxzide 25 daily Declines repeat labs Family hx of heart disease in mother; deceased.      Return if symptoms worsen or fail to improve.     Leilani Merl, FNP, have reviewed all documentation for this visit. The documentation on 12/14/22 for the exam, diagnosis, procedures, and orders are all accurate and complete.  Jacky Kindle, FNP  [facility] [phone] (phone) [phone] (fax)  [facility]

## 2022-12-14 NOTE — Assessment & Plan Note (Signed)
Chronic, remains elevated Home numbers not checked Discussed elevated ASCVD at 15% The 10-year ASCVD risk score (Arnett DK, et al., 2019) is: 14.9% Declines use of additional medication today to assist Currently on Maxzide 25 daily Declines repeat labs Family hx of heart disease in mother; deceased.

## 2023-01-25 ENCOUNTER — Other Ambulatory Visit: Payer: Self-pay | Admitting: Family Medicine

## 2023-01-25 DIAGNOSIS — Z1231 Encounter for screening mammogram for malignant neoplasm of breast: Secondary | ICD-10-CM

## 2023-02-02 DIAGNOSIS — L03032 Cellulitis of left toe: Secondary | ICD-10-CM | POA: Diagnosis not present

## 2023-02-02 DIAGNOSIS — L600 Ingrowing nail: Secondary | ICD-10-CM | POA: Diagnosis not present

## 2023-02-02 DIAGNOSIS — I739 Peripheral vascular disease, unspecified: Secondary | ICD-10-CM | POA: Diagnosis not present

## 2023-02-09 ENCOUNTER — Other Ambulatory Visit: Payer: Self-pay | Admitting: Physician Assistant

## 2023-02-09 DIAGNOSIS — N3010 Interstitial cystitis (chronic) without hematuria: Secondary | ICD-10-CM

## 2023-02-10 MED ORDER — AMITRIPTYLINE HCL 50 MG PO TABS: 50.0000 mg | ORAL_TABLET | Freq: Every day | ORAL | 0 refills | Status: DC

## 2023-02-20 ENCOUNTER — Other Ambulatory Visit: Payer: Self-pay | Admitting: Physician Assistant

## 2023-02-20 DIAGNOSIS — K219 Gastro-esophageal reflux disease without esophagitis: Secondary | ICD-10-CM

## 2023-02-22 NOTE — Telephone Encounter (Signed)
Requested medication (s) are due for refill today:   See pharmacy note.  Requested medication (s) are on the active medication list:   03/03/2022 #90, 3 refills    Future visit scheduled:   Yes 07/14/2023 with Dr. Payton Mccallum.     LOV 12/14/2022 with Merita Norton, FNP   Last ordered: 03/03/2022 #90, 3 refills  Returned because this is not covered by her insurance.     Requested Prescriptions  Pending Prescriptions Disp Refills   pantoprazole (PROTONIX) 40 MG tablet [Pharmacy Med Name: PANTOPRAZOLE SOD DR 40 MG TAB] 90 tablet 0    Sig: Take 1 tablet (40 mg total) by mouth daily.     Gastroenterology: Proton Pump Inhibitors Passed - 02/22/2023  3:23 PM      Passed - Valid encounter within last 12 months    Recent Outpatient Visits           2 months ago Essential hypertension   [facility] Jacky Kindle, FNP   6 months ago Vaginal itching   [facility] Jacky Kindle, FNP   8 months ago Essential hypertension   [facility] Alfredia Ferguson, PA-C   1 year ago Essential hypertension   [facility] Alfredia Ferguson, PA-C   1 year ago Essential hypertension   [facility] Alfredia Ferguson, PA-C       Future Appointments             In 4 months Pardue, Monico Blitz, DO [place] [facility]

## 2023-02-24 ENCOUNTER — Encounter: Payer: Self-pay | Admitting: Family Medicine

## 2023-02-24 DIAGNOSIS — K219 Gastro-esophageal reflux disease without esophagitis: Secondary | ICD-10-CM

## 2023-03-02 ENCOUNTER — Ambulatory Visit: Payer: Self-pay

## 2023-03-02 MED ORDER — PANTOPRAZOLE SODIUM 40 MG PO TBEC: 40.0000 mg | DELAYED_RELEASE_TABLET | Freq: Every day | ORAL | 11 refills | Status: DC

## 2023-03-02 NOTE — Telephone Encounter (Signed)
Pt called back, she states that she checked formulary for [employer] and it shows that Pantoprazole is covered just only covers #30 for 30 DS. Pt was previously getting #90. Pt is asking if new rx can be sent in for Pantoprazole #30 to see if insurance will cover. Pt has issues with recent medication changes d/t insurance and doesn't want to change medications if she doesn't have to. Advised I would pass along to PCP for review and see if new rx can be sent. Advised pt to call pharmacy as well to see if they could send request from old rx or if new one would be needed.

## 2023-03-02 NOTE — Telephone Encounter (Signed)
  Chief Complaint: medication assistance Symptoms: NA Frequency:  Pertinent Negatives: NA Disposition: [] ED /[] Urgent Care (no appt availability in office) / [] Appointment(In office/virtual)/ []  [place] [facility]/ [x] Home Care/ [] Refused Recommended Disposition /[] [place] [facility]/ []  Follow-up with PCP Additional Notes: pt calling wanting to know why Pantoprazole was switched to Omeprazole. Reviewed chart and advised pt based on encounter from 02/20/23, insurance isn't covering Pantoprazole anymore so an alternate was sent in by provider. Pt hasn't started Omeprazole yet but wants to know if medication doesn't work can provider submit request for her to be on pantoprazole instead. Advised pt a PA might be able to be completed if medication doesn't work and shows she requires other medication. Pt verbalized understanding. Will try medication and FU if needed.   Summary: Medication question   Pt has questions about omeprazole (PRILOSEC) 40 MG capsule and wants to speak to a nurse, please advise  Best contact: [phone]         Reason for Disposition  Caller has medicine question only, adult not sick, AND triager answers question  Answer Assessment - Initial Assessment Questions 1. NAME of MEDICINE: "What medicine(s) are you calling about?"     Why pantoprazole was switched to Omeprazole 2. QUESTION: "What is your question?" (e.g., double dose of medicine, side effect)     Wanting to know why medication was switched 3. PRESCRIBER: "Who prescribed the medicine?" Reason: if prescribed by specialist, call should be referred to that group.     Dr. Payton Mccallum 4. SYMPTOMS: "Do you have any symptoms?" If Yes, ask: "What symptoms are you having?"  "How bad are the symptoms (e.g., mild, moderate, severe)     NA  Protocols used: Medication Question Call-A-AH

## 2023-03-05 ENCOUNTER — Ambulatory Visit
Admission: RE | Admit: 2023-03-05 | Discharge: 2023-03-05 | Disposition: A | Discharge status: Home / Self Care | Payer: Medicare Other | Source: Ambulatory Visit | Attending: Family Medicine | Admitting: Family Medicine

## 2023-03-05 DIAGNOSIS — Z1231 Encounter for screening mammogram for malignant neoplasm of breast: Secondary | ICD-10-CM | POA: Insufficient documentation

## 2023-03-17 ENCOUNTER — Encounter: Payer: Self-pay | Admitting: Family Medicine

## 2023-04-22 DIAGNOSIS — H9011 Conductive hearing loss, unilateral, right ear, with unrestricted hearing on the contralateral side: Secondary | ICD-10-CM | POA: Diagnosis not present

## 2023-04-25 ENCOUNTER — Ambulatory Visit
Admission: RE | Admit: 2023-04-25 | Discharge: 2023-04-25 | Discharge status: Home / Self Care | Source: Ambulatory Visit | Attending: Emergency Medicine

## 2023-04-25 VITALS — BP 135/88 | HR 84 | Temp 97.6°F | Resp 18

## 2023-04-25 DIAGNOSIS — R300 Dysuria: Secondary | ICD-10-CM | POA: Diagnosis not present

## 2023-04-25 LAB — POCT URINALYSIS DIP (MANUAL ENTRY)
Bilirubin, UA: NEGATIVE
Blood, UA: NEGATIVE
Glucose, UA: NEGATIVE mg/dL
Ketones, POC UA: NEGATIVE mg/dL
Leukocytes, UA: NEGATIVE
Nitrite, UA: NEGATIVE
Protein Ur, POC: NEGATIVE mg/dL
Spec Grav, UA: 1.015 (ref 1.010–1.025)
Urobilinogen, UA: 0.2 U/dL
pH, UA: 7 (ref 5.0–8.0)

## 2023-04-25 NOTE — Discharge Instructions (Addendum)
 Follow-up with your primary care provider or urologist if your symptoms are not improving.

## 2023-04-25 NOTE — ED Triage Notes (Addendum)
 Patient to Urgent Care with complaints of urinary frequency/ dysuria/ suprapubic pressure. Denies any fevers.   Reports symptoms started yesterday morning.   Taking Cystex.

## 2023-04-25 NOTE — ED Provider Notes (Signed)
 [name]    CSN: [id number] Arrival date & time: 04/25/23  0841      History   Chief Complaint Chief Complaint  Patient presents with   Urinary Frequency    Possible UTI - Entered by patient    HPI [name] is a 73 y.o. female.  Patient presents with 1 day history of dysuria, urinary frequency, bladder pressure.  She has been treating her symptoms with Cystex.  She denies fever, abdominal pain, flank pain, hematuria.  Her medical history includes interstitial cystitis.  The history is provided by the patient and medical records.    Past Medical History:  Diagnosis Date   Complication of anesthesia    GERD (gastroesophageal reflux disease)    Hyperlipidemia    Hypertension    IC (interstitial cystitis)    PONV (postoperative nausea and vomiting)    NO PROBLEM     4/18 WITH RETINAL SURGERY    Patient Active Problem List   Diagnosis Date Noted   Otosclerosis of right ear 08/14/2019   Hyperlipidemia    Age-related osteoporosis without current pathological fracture 06/03/2017   History of right cataract surgery 09/29/2016   Right retinal detachment 05/01/2016   Varicose veins of both lower extremities with inflammation 11/21/2015   Chronic venous insufficiency 11/21/2015   GERD (gastroesophageal reflux disease) 11/23/2014   History of tobacco use 02/26/2006   Abdominal hernia with gangrene and obstruction 02/22/2006   Essential hypertension 04/22/1999    Past Surgical History:  Procedure Laterality Date   BLADDER SUSPENSION     TRANSVAGINAL SLING   BUNIONECTOMY  12/02/2017   CATARACT EXTRACTION W/PHACO Right 09/29/2016   Procedure: CATARACT EXTRACTION PHACO AND INTRAOCULAR LENS PLACEMENT (IOC);  Surgeon: Galen Manila, MD;  Location: [facility] ORS;  Service: Ophthalmology;  Laterality: Right;  [place] 00:47 AP% 21.6 CDE 10.26 fluid pack lot # [id number]   CHOLECYSTECTOMY     EYE SURGERY     INSERTION OF MESH  2008   Trans vaginal mesh   LASER ABLATION  Right 11/21/2015   [facility] Dr. Gilda Crease    OB History     Gravida  2   Para  2   Term  2   Preterm      AB      Living  2      SAB      IAB      Ectopic      Multiple      Live Births  2            Home Medications    Prior to Admission medications   Medication Sig Start Date End Date Taking? Authorizing Provider  amitriptyline (ELAVIL) 50 MG tablet Take 1 tablet (50 mg total) by mouth at bedtime. 02/10/23   Sherlyn Hay, DO  Calcium Carbonate-Vitamin D 600-200 MG-UNIT CAPS Take 2 tablets by mouth daily. 07/15/07   [provider]  Calcium Glycerophosphate (PRELIEF PO) Take 2 tablets by mouth daily. As needed    [provider]  Cholecalciferol 125 MCG (5000 UT) TABS Take by mouth. 12/01/17   [provider]  Flaxseed, Linseed, (FLAXSEED OIL PO) Take by mouth.    [provider]  Lifitegrast Benay Spice) 5 % SOLN Apply to eye.    [provider]  MYRBETRIQ 50 MG TB24 tablet  06/03/17   [provider]  nystatin-triamcinolone ointment (MYCOLOG) Apply 1 Application topically 2 (two) times daily. 11/30/22   Simmons-Robinson, [place],  MD  pantoprazole (PROTONIX) 40 MG tablet Take 1 tablet (40 mg total) by mouth daily. 03/02/23   Sherlyn Hay, DO  triamterene-hydrochlorothiazide (MAXZIDE-25) 37.5-25 MG tablet Take 1 tablet by mouth daily. 06/10/22   Alfredia Ferguson, PA-C  vitamin C (ASCORBIC ACID) 500 MG tablet Take 500 mg by mouth daily.    [provider]    Family History Family History  Problem Relation Age of Onset   Congestive Heart Failure Mother    Cancer Father        Bladder   Bladder Cancer Father    Hypertension Father    Coronary artery disease Father        Stent Placed 1990   Liver disease Father    Hypertension Sister    Lung cancer Sister    Breast cancer Paternal Aunt        91s   Healthy Son    Healthy Son     Social History Social History   Tobacco  Use   Smoking status: Former    Current packs/day: 0.00    Average packs/day: 1 pack/day for 35.0 years (35.0 ttl pk-yrs)    Types: Cigarettes    Start date: 01/19/1961    Quit date: 01/20/1996    Years since quitting: 27.2   Smokeless tobacco: Never  Vaping Use   Vaping status: Never Used  Substance Use Topics   Alcohol use: No   Drug use: No     Allergies   Doxycycline, Penicillins, Pneumococcal 13-val conj vacc, and Sulfa antibiotics   Review of Systems Review of Systems  Constitutional:  Negative for chills and fever.  Gastrointestinal:  Negative for abdominal pain and nausea.  Genitourinary:  Positive for dysuria and frequency. Negative for flank pain and hematuria.     Physical Exam Triage Vital Signs ED Triage Vitals  Encounter Vitals Group     BP      Systolic BP Percentile      Diastolic BP Percentile      Pulse      Resp      Temp      Temp src      SpO2      Weight      Height      Head Circumference      Peak Flow      Pain Score      Pain Loc      Pain Education      Exclude from Growth Chart    No data found.  Updated Vital Signs BP 135/88   Pulse 84   Temp 97.6 F (36.4 C)   Resp 18   SpO2 98%   Visual Acuity Right Eye Distance:   Left Eye Distance:   Bilateral Distance:    Right Eye Near:   Left Eye Near:    Bilateral Near:     Physical Exam Constitutional:      General: She is not in acute distress. HENT:     Mouth/Throat:     Mouth: Mucous membranes are moist.  Cardiovascular:     Rate and Rhythm: Normal rate and regular rhythm.  Pulmonary:     Effort: Pulmonary effort is normal. No respiratory distress.  Abdominal:     General: Bowel sounds are normal.     Palpations: Abdomen is soft.     Tenderness: There is no abdominal tenderness. There is no right CVA tenderness, left CVA tenderness, guarding or rebound.  Neurological:  Mental Status: She is alert.      UC Treatments / Results  Labs (all labs ordered are  listed, but only abnormal results are displayed) Labs Reviewed  POCT URINALYSIS DIP (MANUAL ENTRY)    EKG   Radiology No results found.  Procedures Procedures (including critical care time)  Medications Ordered in UC Medications - No data to display  Initial Impression / Assessment and Plan / UC Course  I have reviewed the triage vital signs and the nursing notes.  Pertinent labs & imaging results that were available during my care of the patient were reviewed by me and considered in my medical decision making (see chart for details).    Dysuria.  Urine does not show signs of infection.  Discussed symptomatic management.  Instructed patient to follow-up with her PCP or urologist if her symptoms persist.  She agrees to plan of care.  Final Clinical Impressions(s) / UC Diagnoses   Final diagnoses:  Dysuria     Discharge Instructions      Follow-up with your primary care provider or urologist if your symptoms are not improving.      ED Prescriptions   None    PDMP not reviewed this encounter.   Mickie Bail, NP 04/25/23 [phone]

## 2023-05-08 ENCOUNTER — Other Ambulatory Visit: Payer: Self-pay | Admitting: Family Medicine

## 2023-05-08 DIAGNOSIS — N3010 Interstitial cystitis (chronic) without hematuria: Secondary | ICD-10-CM

## 2023-06-28 DIAGNOSIS — H21233 Degeneration of iris (pigmentary), bilateral: Secondary | ICD-10-CM | POA: Diagnosis not present

## 2023-06-28 DIAGNOSIS — H02889 Meibomian gland dysfunction of unspecified eye, unspecified eyelid: Secondary | ICD-10-CM | POA: Diagnosis not present

## 2023-06-28 DIAGNOSIS — H2512 Age-related nuclear cataract, left eye: Secondary | ICD-10-CM | POA: Diagnosis not present

## 2023-06-28 DIAGNOSIS — M3501 Sicca syndrome with keratoconjunctivitis: Secondary | ICD-10-CM | POA: Diagnosis not present

## 2023-07-05 DIAGNOSIS — R300 Dysuria: Secondary | ICD-10-CM | POA: Diagnosis not present

## 2023-07-05 DIAGNOSIS — R3915 Urgency of urination: Secondary | ICD-10-CM | POA: Diagnosis not present

## 2023-07-09 ENCOUNTER — Other Ambulatory Visit: Payer: Self-pay | Admitting: Physician Assistant

## 2023-07-09 DIAGNOSIS — I10 Essential (primary) hypertension: Secondary | ICD-10-CM

## 2023-07-14 ENCOUNTER — Encounter: Payer: Self-pay | Admitting: Family Medicine

## 2023-07-14 ENCOUNTER — Ambulatory Visit (INDEPENDENT_AMBULATORY_CARE_PROVIDER_SITE_OTHER): Payer: Self-pay | Admitting: Family Medicine

## 2023-07-14 VITALS — BP 126/70 | HR 76 | Temp 97.5°F | Resp 16 | Ht 64.5 in | Wt 120.7 lb

## 2023-07-14 DIAGNOSIS — I10 Essential (primary) hypertension: Secondary | ICD-10-CM | POA: Diagnosis not present

## 2023-07-14 DIAGNOSIS — Z13820 Encounter for screening for osteoporosis: Secondary | ICD-10-CM

## 2023-07-14 DIAGNOSIS — M810 Age-related osteoporosis without current pathological fracture: Secondary | ICD-10-CM

## 2023-07-14 DIAGNOSIS — Z780 Asymptomatic menopausal state: Secondary | ICD-10-CM | POA: Diagnosis not present

## 2023-07-14 DIAGNOSIS — D692 Other nonthrombocytopenic purpura: Secondary | ICD-10-CM | POA: Insufficient documentation

## 2023-07-14 DIAGNOSIS — Z0000 Encounter for general adult medical examination without abnormal findings: Secondary | ICD-10-CM | POA: Diagnosis not present

## 2023-07-14 DIAGNOSIS — K219 Gastro-esophageal reflux disease without esophagitis: Secondary | ICD-10-CM | POA: Diagnosis not present

## 2023-07-14 DIAGNOSIS — Z79899 Other long term (current) drug therapy: Secondary | ICD-10-CM

## 2023-07-14 DIAGNOSIS — E7800 Pure hypercholesterolemia, unspecified: Secondary | ICD-10-CM | POA: Diagnosis not present

## 2023-07-14 DIAGNOSIS — N3010 Interstitial cystitis (chronic) without hematuria: Secondary | ICD-10-CM | POA: Diagnosis not present

## 2023-07-14 MED ORDER — TRIAMTERENE-HCTZ 37.5-25 MG PO TABS: 1.0000 | ORAL_TABLET | Freq: Every day | ORAL | 2 refills | Status: AC

## 2023-07-14 MED ORDER — AMITRIPTYLINE HCL 50 MG PO TABS: 50.0000 mg | ORAL_TABLET | Freq: Every day | ORAL | 3 refills | Status: AC

## 2023-07-14 NOTE — Patient Instructions (Addendum)
 Recommend updating your Tdap vaccine (for tetanus, diphtheria and pertussis (whooping cough)).  Please call the [facility] [phone]) to schedule a bone density test.

## 2023-07-14 NOTE — Addendum Note (Signed)
 Addended by: DONZELLA DOMINO on: 07/14/2023 12:35 PM   Modules accepted: Orders

## 2023-07-14 NOTE — Progress Notes (Addendum)
 Complete physical exam   Patient: [name]   DOB: 1950-06-20   73 y.o. Female  MRN: [id number] Visit Date: 07/14/2023  Today's healthcare provider: LAURAINE LOISE BUOY, DO   Chief Complaint  Patient presents with   Annual Exam    Patient here for physical exam. She had medicare wellness done 09/28/2022. Patient reports feeling well. She is exercising. She is sleeping well.   Subjective    [name] is a 73 y.o. female who presents today for a complete physical exam.  She reports consuming a general, well-balanced diet. She exercises regularly as noted below. She generally feels well. She reports sleeping well. She does not have additional problems to discuss today.   HPI HPI     Annual Exam    Additional comments: Patient here for physical exam. She had medicare wellness done 09/28/2022. Patient reports feeling well. She is exercising. She is sleeping well.      Last edited by Rosas, Joseline E, CMA on 07/14/2023  8:08 AM.      [name] is a 73 year old female who presents for an annual physical exam.  She has a history of osteoporosis and is currently taking vitamin D and calcium supplements. She has not experienced any fractures. She takes pantoprazole  daily and has a history of easy bruising, which she attributes to thin skin and age. She experienced an allergic reaction to the Prevnar 13 vaccine, resulting in significant redness and requiring medication to resolve. She has received both doses of the new shingles vaccine and is unsure about her last tetanus vaccine.  She exercises daily, engaging in activities such as walking, strength training, cardio, and balance exercises. She mentions that her balance is sometimes poor, but she works on improving it through exercises like squats. She follows a generally healthy diet, avoiding boxed dinners and cooking from scratch.  She had surgery on her left foot about five years ago and sometimes experiences numbness in that  foot, although it is not constant. She has not had any recent issues with swelling in her legs.     Past Medical History:  Diagnosis Date   Allergy see chart   Cataract see chart   Complication of anesthesia    GERD (gastroesophageal reflux disease)    Hyperlipidemia    Hypertension    IC (interstitial cystitis)    Osteoporosis    PONV (postoperative nausea and vomiting)    NO PROBLEM     4/18 WITH RETINAL SURGERY   Past Surgical History:  Procedure Laterality Date   BLADDER SUSPENSION     TRANSVAGINAL SLING   BUNIONECTOMY  12/02/2017   CATARACT EXTRACTION W/PHACO Right 09/29/2016   Procedure: CATARACT EXTRACTION PHACO AND INTRAOCULAR LENS PLACEMENT (IOC);  Surgeon: Jaye Fallow, MD;  Location: [facility] ORS;  Service: Ophthalmology;  Laterality: Right;  US  00:47 AP% 21.6 CDE 10.26 fluid pack lot # [id number]   CHOLECYSTECTOMY     EYE SURGERY     INSERTION OF MESH  2008   Trans vaginal mesh   LASER ABLATION Right 11/21/2015   [facility] Dr. Jama   Social History   Socioeconomic History   Marital status: Married    Spouse name: Not on file   Number of children: 2   Years of education: H/S   Highest education level: 12th grade  Occupational History   Occupation: Full-time  Tobacco Use   Smoking status: Former    Current packs/day: 0.00  Average packs/day: 1 pack/day for 35.0 years (35.0 ttl pk-yrs)    Types: Cigarettes    Start date: 01/19/1961    Quit date: 01/20/1996    Years since quitting: 27.4   Smokeless tobacco: Never  Vaping Use   Vaping status: Never Used  Substance and Sexual Activity   Alcohol use: No   Drug use: No   Sexual activity: Not Currently    Birth control/protection: None  Other Topics Concern   Not on file  Social History Narrative   Not on file   Social Drivers of Health   Financial Resource Strain: Low Risk  (07/09/2023)   Overall Financial Resource Strain (CARDIA)    Difficulty of Paying Living Expenses: Not  hard at all  Food Insecurity: No Food Insecurity (07/09/2023)   Hunger Vital Sign    Worried About Running Out of Food in the Last Year: Never true    Ran Out of Food in the Last Year: Never true  Transportation Needs: No Transportation Needs (07/09/2023)   PRAPARE - Administrator, Civil Service (Medical): No    Lack of Transportation (Non-Medical): No  Physical Activity: Sufficiently Active (07/09/2023)   Exercise Vital Sign    Days of Exercise per Week: 6 days    Minutes of Exercise per Session: 40 min  Stress: No Stress Concern Present (07/09/2023)   [employer] of Occupational Health - Occupational Stress Questionnaire    Feeling of Stress: Only a little  Social Connections: Socially Integrated (07/09/2023)   Social Connection and Isolation Panel    Frequency of Communication with Friends and Family: More than three times a week    Frequency of Social Gatherings with Friends and Family: More than three times a week    Attends Religious Services: More than 4 times per year    Active Member of Clubs or Organizations: Yes    Attends Banker Meetings: More than 4 times per year    Marital Status: Married  Catering manager Violence: Not At Risk (09/28/2022)   Humiliation, Afraid, Rape, and Kick questionnaire    Fear of Current or Ex-Partner: No    Emotionally Abused: No    Physically Abused: No    Sexually Abused: No   Family Status  Relation Name Status   Mother Almarie Ghazi Deceased at age 22   Father Lannis Ghazi Deceased at age 39       liver   Sister Ronal Dene [place] Alive   Pat Aunt  Deceased   Son  Alive   Son  Alive  No partnership data on file   Family History  Problem Relation Age of Onset   Congestive Heart Failure Mother    Heart disease Mother    Stroke Mother    Cancer Father        Bladder   Bladder Cancer Father    Hypertension Father    Coronary artery disease Father        Stent Placed 1990   Liver disease Father     Hypertension Sister    Lung cancer Sister    Cancer Sister    Diabetes Sister    Heart disease Sister    Stroke Sister    Breast cancer Paternal Aunt        2s   Healthy Son    Healthy Son    Allergies  Allergen Reactions   Doxycycline  Nausea Only   Penicillins Rash   Pneumococcal 13-Val Conj Vacc Rash  Localized swelling and erythema of upper extremity; patient refuses pneumococcal 23 and wants documented she will not take due to this reaction Localized swelling and erythema of upper extremity; patient refuses pneumococcal 23 and wants documented she will not take due to this reaction   Sulfa Antibiotics Rash    Patient Care Team: Donzella Lauraine SAILOR, DO as PCP - General (Family Medicine) Jaye Fallow, MD as Referring Physician (Ophthalmology)   Medications: Outpatient Medications Prior to Visit  Medication Sig   Calcium Carbonate-Vitamin D 600-200 MG-UNIT CAPS Take 2 tablets by mouth daily.   Calcium Glycerophosphate (PRELIEF PO) Take 2 tablets by mouth daily. As needed   Cholecalciferol 125 MCG (5000 UT) TABS Take by mouth.   Flaxseed, Linseed, (FLAXSEED OIL PO) Take by mouth.   Lifitegrast (XIIDRA) 5 % SOLN Apply to eye.   MYRBETRIQ 50 MG TB24 tablet    pantoprazole  (PROTONIX ) 40 MG tablet Take 1 tablet (40 mg total) by mouth daily.   vitamin C (ASCORBIC ACID) 500 MG tablet Take 500 mg by mouth daily.   [DISCONTINUED] amitriptyline  (ELAVIL ) 50 MG tablet Take 1 tablet (50 mg total) by mouth at bedtime.   [DISCONTINUED] triamterene -hydrochlorothiazide (MAXZIDE-25) 37.5-25 MG tablet Take 1 tablet by mouth daily.   [DISCONTINUED] nystatin -triamcinolone  ointment (MYCOLOG) Apply 1 Application topically 2 (two) times daily.   No facility-administered medications prior to visit.    Review of Systems  Constitutional:  Negative for chills, fatigue and fever.  HENT:  Negative for congestion, ear pain, rhinorrhea, sneezing and sore throat.   Eyes: Negative.  Negative for pain  and redness.  Respiratory:  Negative for cough, shortness of breath and wheezing.   Cardiovascular:  Negative for chest pain and leg swelling.  Gastrointestinal:  Negative for abdominal pain, blood in stool, constipation, diarrhea and nausea.  Endocrine: Negative for polydipsia and polyphagia.  Genitourinary: Negative.  Negative for dysuria, flank pain, hematuria, pelvic pain, vaginal bleeding and vaginal discharge.  Musculoskeletal:  Negative for arthralgias, back pain, gait problem and joint swelling.  Skin:  Negative for rash.  Neurological: Negative.  Negative for dizziness, tremors, seizures, weakness, light-headedness, numbness and headaches.  Hematological:  Negative for adenopathy.  Psychiatric/Behavioral: Negative.  Negative for behavioral problems, confusion and dysphoric mood. The patient is not nervous/anxious and is not hyperactive.       Objective    BP 126/70 (BP Location: Left Arm, Patient Position: Sitting, Cuff Size: Normal)   Pulse 76   Temp (!) 97.5 F (36.4 C) (Oral)   Resp 16   Ht 5' 4.5 (1.638 m)   Wt 120 lb 11.2 oz (54.7 kg)   SpO2 100%   BMI 20.40 kg/m    Physical Exam Vitals and nursing note reviewed.  Constitutional:      General: She is awake.     Appearance: Normal appearance.  HENT:     Head: Normocephalic and atraumatic.     Right Ear: Tympanic membrane, ear canal and external ear normal.     Left Ear: Tympanic membrane, ear canal and external ear normal.     Nose: Nose normal.     Mouth/Throat:     Mouth: Mucous membranes are moist.     Pharynx: Oropharynx is clear. No oropharyngeal exudate or posterior oropharyngeal erythema.   Eyes:     General: No scleral icterus.    Extraocular Movements: Extraocular movements intact.     Conjunctiva/sclera: Conjunctivae normal.     Pupils: Pupils are equal, round, and reactive to light.  Neck:     Thyroid : No thyromegaly or thyroid  tenderness.   Cardiovascular:     Rate and Rhythm: Normal rate  and regular rhythm.     Pulses: Normal pulses.     Heart sounds: Normal heart sounds.  Pulmonary:     Effort: Pulmonary effort is normal. No tachypnea, bradypnea or respiratory distress.     Breath sounds: Normal breath sounds. No stridor. No wheezing, rhonchi or rales.  Abdominal:     General: Bowel sounds are normal. There is no distension.     Palpations: Abdomen is soft. There is no mass.     Tenderness: There is no abdominal tenderness. There is no guarding.     Hernia: No hernia is present.   Musculoskeletal:     Cervical back: Normal range of motion and neck supple.     Right lower leg: No edema.     Left lower leg: No edema.  Lymphadenopathy:     Cervical: No cervical adenopathy.   Skin:    General: Skin is warm and dry.   Neurological:     Mental Status: She is alert and oriented to person, place, and time. Mental status is at baseline.   Psychiatric:        Mood and Affect: Mood normal.        Behavior: Behavior normal.      Last depression screening scores    07/14/2023    8:10 AM 12/14/2022    9:54 AM 09/28/2022    8:23 AM  PHQ 2/9 Scores  PHQ - 2 Score 0 0 0  PHQ- 9 Score  1    Last fall risk screening    07/14/2023    8:10 AM  Fall Risk   Falls in the past year? 0  Number falls in past yr: 0  Injury with Fall? 0  Risk for fall due to : No Fall Risks   Last Audit-C alcohol use screening    07/09/2023    2:20 PM  Alcohol Use Disorder Test (AUDIT)  1. How often do you have a drink containing alcohol? 0  3. How often do you have six or more drinks on one occasion? 0   A score of 3 or more in women, and 4 or more in men indicates increased risk for alcohol abuse, EXCEPT if all of the points are from question 1   No results found for any visits on 07/14/23.  Assessment & Plan    Routine Health Maintenance and Physical Exam  Exercise Activities and Dietary recommendations  Goals      DIET - EAT MORE FRUITS AND VEGETABLES     Exercise 150 minutes  per week (moderate activity)        Immunization History  Administered Date(s) Administered   H1N1 12/22/2007   Influenza Split 11/19/2005, 10/07/2009, 10/10/2010   Influenza, High Dose Seasonal PF 10/28/2020   Influenza,inj,Quad PF,6+ Mos 10/20/2014   Influenza-Unspecified 10/15/2015, 10/18/2017, 12/15/2021   Janssen (J&J) SARS-COV-2 Vaccination 06/02/2019   Pneumococcal Conjugate-13 06/03/2017   Tdap 10/10/2010   Zoster Recombinant(Shingrix) 05/28/2021, 09/10/2021   Zoster, Live 02/16/2015    Health Maintenance  Topic Date Due   DTaP/Tdap/Td (2 - Td or Tdap) 10/09/2020   COVID-19 Vaccine (2 - Janssen risk series) 10/20/2023 (Originally 06/30/2019)   INFLUENZA VACCINE  08/20/2023   Medicare Annual Wellness (AWV)  09/28/2023   MAMMOGRAM  03/04/2024   DEXA SCAN  Completed   Hepatitis C Screening  Completed   Zoster  Vaccines- Shingrix  Completed   Hepatitis B Vaccines  Aged Out   HPV VACCINES  Aged Out   Meningococcal B Vaccine  Aged Out   Pneumococcal Vaccine: 50+ Years  Discontinued   Colonoscopy  Discontinued   Fecal DNA (Cologuard)  Discontinued    Discussed health benefits of physical activity, and encouraged her to engage in regular exercise appropriate for her age and condition.   Annual physical exam  Essential hypertension -     Comprehensive metabolic panel with GFR -     Triamterene -HCTZ; Take 1 tablet by mouth daily.  Dispense: 90 tablet; Refill: 2  Pure hypercholesterolemia -     Lipid panel  Gastroesophageal reflux disease without esophagitis -     Vitamin B12  High risk medication use -     Vitamin B12  Encounter for osteoporosis screening in asymptomatic postmenopausal patient  Age-related osteoporosis without current pathological fracture -     DG Bone Density; Future  Interstitial cystitis -     Amitriptyline  HCl; Take 1 tablet (50 mg total) by mouth at bedtime.  Dispense: 90 tablet; Refill: 3  Senile purpura (HCC)      Annual  physical exam 73 year old female in good health, exercises regularly, follows a healthy diet. On pantoprazole , affecting vitamin B12 absorption. Osteoporosis with T-score of -3.7, not on medication. Discussed osteoporosis medication risks and benefits. - Order metabolic panel for kidney, liver function, and electrolytes. - Order cholesterol panel. - Check vitamin B12 level. - Recommend tetanus vaccine update. - Schedule bone density test. - Discuss osteoporosis medication post-test. - Advise contacting [facility] for bone density test. - Follow up in one year for annual wellness visit.  Hypertension Chronic, stable.  Continue triamterene -hydrochlorothiazide 37.5-25 mg daily.  Osteoporosis We will repeat DEXA scan today for monitoring, as patient is not on medication to address her osteoporosis with previous T-score of -3.7 in 2022.  Will revisit medication options if her T-score worsens.  GERD Chronic, stable.  Continue pantoprazole  40 mg daily.  Will check B12 today given reduced absorption on PPI.  Senile purpura Ongoing for several years according to patient.  Minimal bruising noted on physical exam.  Historically stable CBC.  No acute concern.  Continue to monitor.     Return in about 1 year (around 07/13/2024) for CPE.     I discussed the assessment and treatment plan with the patient  The patient was provided an opportunity to ask questions and all were answered. The patient agreed with the plan and demonstrated an understanding of the instructions.   The patient was advised to call back or seek an in-person evaluation if the symptoms worsen or if the condition fails to improve as anticipated.    LAURAINE LOISE BUOY, DO  [facility] [phone] (phone) [phone] (fax)  [facility]

## 2023-07-15 LAB — LIPID PANEL
Chol/HDL Ratio: 2.6 ratio (ref 0.0–4.4)
Cholesterol, Total: 188 mg/dL (ref 100–199)
HDL: 71 mg/dL (ref >39)
LDL Chol Calc (NIH): 107 mg/dL — ABNORMAL HIGH (ref 0–99)
Triglycerides: 53 mg/dL (ref 0–149)
VLDL Cholesterol Cal: 10 mg/dL (ref 5–40)

## 2023-07-15 LAB — COMPREHENSIVE METABOLIC PANEL WITH GFR
ALT: 15 IU/L (ref 0–32)
AST: 20 IU/L (ref 0–40)
Albumin: 4.5 g/dL (ref 3.8–4.8)
Alkaline Phosphatase: 68 IU/L (ref 44–121)
BUN/Creatinine Ratio: 19 (ref 12–28)
BUN: 15 mg/dL (ref 8–27)
Bilirubin Total: 0.3 mg/dL (ref 0.0–1.2)
CO2: 25 mmol/L (ref 20–29)
Calcium: 9.8 mg/dL (ref 8.7–10.3)
Chloride: 97 mmol/L (ref 96–106)
Creatinine, Ser: 0.78 mg/dL (ref 0.57–1.00)
Globulin, Total: 2.4 g/dL (ref 1.5–4.5)
Glucose: 76 mg/dL (ref 70–99)
Potassium: 5 mmol/L (ref 3.5–5.2)
Sodium: 136 mmol/L (ref 134–144)
Total Protein: 6.9 g/dL (ref 6.0–8.5)
eGFR: 81 mL/min/{1.73_m2} (ref >59)

## 2023-07-15 LAB — VITAMIN B12: Vitamin B-12: 639 pg/mL (ref 232–1245)

## 2023-07-19 ENCOUNTER — Ambulatory Visit: Payer: Self-pay | Admitting: Family Medicine

## 2023-07-19 DIAGNOSIS — M810 Age-related osteoporosis without current pathological fracture: Secondary | ICD-10-CM

## 2023-09-01 DIAGNOSIS — M2042 Other hammer toe(s) (acquired), left foot: Secondary | ICD-10-CM | POA: Diagnosis not present

## 2023-09-01 DIAGNOSIS — M898X9 Other specified disorders of bone, unspecified site: Secondary | ICD-10-CM | POA: Diagnosis not present

## 2023-09-09 ENCOUNTER — Ambulatory Visit
Admission: RE | Admit: 2023-09-09 | Discharge: 2023-09-09 | Disposition: A | Discharge status: Home / Self Care | Source: Ambulatory Visit | Attending: Family Medicine | Admitting: Family Medicine

## 2023-09-09 DIAGNOSIS — M810 Age-related osteoporosis without current pathological fracture: Secondary | ICD-10-CM | POA: Diagnosis not present

## 2023-09-09 DIAGNOSIS — Z780 Asymptomatic menopausal state: Secondary | ICD-10-CM | POA: Diagnosis not present

## 2023-10-04 ENCOUNTER — Encounter: Payer: Self-pay | Admitting: Family Medicine

## 2023-10-13 DIAGNOSIS — Z87891 Personal history of nicotine dependence: Secondary | ICD-10-CM | POA: Diagnosis not present

## 2023-10-13 DIAGNOSIS — Z8262 Family history of osteoporosis: Secondary | ICD-10-CM | POA: Diagnosis not present

## 2023-10-13 DIAGNOSIS — M810 Age-related osteoporosis without current pathological fracture: Secondary | ICD-10-CM | POA: Diagnosis not present

## 2023-11-03 DIAGNOSIS — M810 Age-related osteoporosis without current pathological fracture: Secondary | ICD-10-CM | POA: Diagnosis not present

## 2023-11-19 ENCOUNTER — Encounter: Admitting: Nurse Practitioner

## 2023-12-17 NOTE — Patient Instructions (Signed)

## 2023-12-22 ENCOUNTER — Encounter: Payer: Self-pay | Admitting: Nurse Practitioner

## 2023-12-22 ENCOUNTER — Ambulatory Visit: Admitting: Nurse Practitioner

## 2023-12-22 VITALS — BP 128/66 | HR 81 | Temp 98.4°F | Resp 15 | Ht 64.49 in | Wt 121.2 lb

## 2023-12-22 DIAGNOSIS — I10 Essential (primary) hypertension: Secondary | ICD-10-CM

## 2023-12-22 DIAGNOSIS — M810 Age-related osteoporosis without current pathological fracture: Secondary | ICD-10-CM | POA: Diagnosis not present

## 2023-12-22 DIAGNOSIS — Z7689 Persons encountering health services in other specified circumstances: Secondary | ICD-10-CM | POA: Diagnosis not present

## 2023-12-22 DIAGNOSIS — K219 Gastro-esophageal reflux disease without esophagitis: Secondary | ICD-10-CM | POA: Diagnosis not present

## 2023-12-22 DIAGNOSIS — E7800 Pure hypercholesterolemia, unspecified: Secondary | ICD-10-CM | POA: Diagnosis not present

## 2023-12-22 NOTE — Assessment & Plan Note (Signed)
 Chronic, ongoing. Followed by endo and receiving Reclast infusions annually.  Will continue this collaboration. Recent notes and labs reviewed. Recheck Vitamin D since recent level was above normal and she has adjusted supplements.

## 2023-12-22 NOTE — Progress Notes (Signed)
 New Patient Office Visit  Subjective    Patient ID: [name], female    DOB: [birth year]  Age: 73 y.o. MRN: [id number]  CC:  Chief Complaint  Patient presents with   Transfer of Care    New patient from Dr. Donzella Jacobs.     HPI [name] presents for new patient visit to establish care.  Introduced to publishing rights manager role and practice setting.  All questions answered.  Discussed provider/patient relationship and expectations.  Takes Myrbetriq and Amitriptyline  for interstitial cystitis. Follows with urology in [place] at [place].  OSTEOPOROSIS Started infusions of Reclast on 11/03/23, initial one.  Saw endo on 10/13/23. No recent falls or fractures. Is taking Vitamin D but had to cut it back a little due to level being elevated in September. Satisfied with current treatment?: yes Medication side effects: no Medication compliance: good compliance Past osteoporosis medications/treatments:  Adequate calcium & vitamin D: yes Intolerance to bisphosphonates:has not tried Weight bearing exercises: yes   HYPERTENSION / HYPERLIPIDEMIA Continues to take Maxzide daily, has been on for many years. Satisfied with current treatment? yes Duration of hypertension: chronic BP monitoring frequency: not checking BP range:  BP medication side effects: no Duration of hyperlipidemia: chronic Cholesterol supplements: flax seed oil Aspirin: no Recent stressors: no Recurrent headaches: no Visual changes: no Palpitations: no Dyspnea: no Chest pain: no Lower extremity edema: no Dizzy/lightheaded: no  The 10-year ASCVD risk score (Arnett DK, et al., 2019) is: 13.3%   Values used to calculate the score:     Age: 45 years     Clincally relevant sex: Female     Is Non-Hispanic African American: No     Diabetic: No     Tobacco smoker: No     Systolic Blood Pressure: 128 mmHg     Is BP treated: Yes     HDL Cholesterol: 71 mg/dL     Total Cholesterol: 188  mg/dL  GERD Takes Protonix  daily, has been on this for long time. GERD control status: stable Satisfied with current treatment? yes Heartburn frequency: rarely Medication side effects: no  Medication compliance: stable Dysphagia: no Odynophagia:  no Hematemesis: no Blood in stool: no EGD: yes      07/14/2023    8:10 AM 12/14/2022    9:54 AM 09/28/2022    8:23 AM 08/25/2022   10:34 AM 06/10/2022    8:53 AM  Depression screen PHQ 2/9  Decreased Interest 0 0 0 0 0  Down, Depressed, Hopeless 0 0 0 0 0  PHQ - 2 Score 0 0 0 0 0  Altered sleeping  0   0  Tired, decreased energy  1   1  Change in appetite  0   0  Feeling bad or failure about yourself   0   0  Trouble concentrating  0   0  Moving slowly or fidgety/restless  0   0  Suicidal thoughts  0   0  PHQ-9 Score  1    1   Difficult doing work/chores  Not difficult at all   Not difficult at all     Data saved with a previous flowsheet row definition       07/14/2023    8:10 AM 12/14/2022    9:54 AM 06/03/2017   11:03 AM  GAD 7 : Generalized Anxiety Score  Nervous, Anxious, on Edge 0 0 0  Control/stop worrying 0 0 0  Worry too much - different things 0 1  0  Trouble relaxing 0 0 0  Restless 0 1 0  Easily annoyed or irritable 0 1 0  Afraid - awful might happen 0 0 0  Total GAD 7 Score 0 3 0  Anxiety Difficulty Not difficult at all Not difficult at all    Outpatient Encounter Medications as of 12/22/2023  Medication Sig   amitriptyline  (ELAVIL ) 50 MG tablet Take 1 tablet (50 mg total) by mouth at bedtime.   ascorbic acid (VITAMIN C) 500 MG tablet Take 500 mg by mouth daily.   Calcium Carbonate-Vitamin D 600-200 MG-UNIT CAPS Take 2 tablets by mouth daily.   Calcium Glycerophosphate (PRELIEF PO) Take 2 tablets by mouth daily. As needed   Cholecalciferol 50 MCG (2000 UT) TABS Take by mouth.   Flaxseed, Linseed, (FLAXSEED OIL PO) Take by mouth. (Patient taking differently: Take 1,242 mg by mouth in the morning and at bedtime.)    Lifitegrast (XIIDRA) 5 % SOLN Apply to eye.   MYRBETRIQ 50 MG TB24 tablet    pantoprazole  (PROTONIX ) 40 MG tablet Take 1 tablet (40 mg total) by mouth daily.   triamterene -hydrochlorothiazide (MAXZIDE-25) 37.5-25 MG tablet Take 1 tablet by mouth daily.   vitamin C (ASCORBIC ACID) 500 MG tablet Take 500 mg by mouth daily.   zoledronic acid (RECLAST) 5 MG/100ML SOLN injection Inject 5 mg into the vein once.   [DISCONTINUED] MIEBO 1.338 GM/ML SOLN Apply 1 drop to eye 4 (four) times daily.   No facility-administered encounter medications on file as of 12/22/2023.    Past Medical History:  Diagnosis Date   Allergy see chart   Cataract see chart   Complication of anesthesia    GERD (gastroesophageal reflux disease) see chart   Hyperlipidemia    Hypertension see chart   IC (interstitial cystitis)    Osteoporosis    PONV (postoperative nausea and vomiting)    NO PROBLEM     4/18 WITH RETINAL SURGERY    Past Surgical History:  Procedure Laterality Date   BLADDER SUSPENSION     TRANSVAGINAL SLING   BUNIONECTOMY  12/02/2017   CATARACT EXTRACTION W/PHACO Right 09/29/2016   Procedure: CATARACT EXTRACTION PHACO AND INTRAOCULAR LENS PLACEMENT (IOC);  Surgeon: Jaye Fallow, MD;  Location: [facility] ORS;  Service: Ophthalmology;  Laterality: Right;  US  00:47 AP% 21.6 CDE 10.26 fluid pack lot # [id number]   CHOLECYSTECTOMY  see chart   EYE SURGERY  see chart   INSERTION OF MESH  2008   Trans vaginal mesh   LASER ABLATION Right 11/21/2015   [facility] Dr. Jama    Family History  Problem Relation Age of Onset   Congestive Heart Failure Mother    Heart disease Mother    Stroke Mother    Cancer Father        Bladder   Bladder Cancer Father    Hypertension Father    Coronary artery disease Father        Stent Placed 1990   Liver disease Father    Hypertension Sister    Lung cancer Sister    Cancer Sister    Diabetes Sister    Heart disease Sister    Stroke  Sister    Healthy Son    Healthy Son    Breast cancer Paternal Aunt        95s    Social History   Socioeconomic History   Marital status: Married    Spouse name: Layman   Number of children: 2  Years of education: H/S   Highest education level: 12th grade  Occupational History   Occupation: Full-time  Tobacco Use   Smoking status: Former    Current packs/day: 0.00    Average packs/day: 1 pack/day for 29.0 years (29.0 ttl pk-yrs)    Types: Cigarettes    Start date: 01/20/1967    Quit date: 01/20/1996    Years since quitting: 27.9   Smokeless tobacco: Never  Vaping Use   Vaping status: Never Used  Substance and Sexual Activity   Alcohol use: Not Currently   Drug use: Never   Sexual activity: Not Currently    Birth control/protection: None  Other Topics Concern   Not on file  Social History Narrative   Not on file   Social Drivers of Health   Financial Resource Strain: Low Risk  (10/13/2023)   Received from [facility]   Overall Financial Resource Strain (CARDIA)    Difficulty of Paying Living Expenses: Not hard at all  Food Insecurity: No Food Insecurity (10/13/2023)   Received from [facility]   Hunger Vital Sign    Within the past 12 months, you worried that your food would run out before you got the money to buy more.: Never true    Within the past 12 months, the food you bought just didn't last and you didn't have money to get more.: Never true  Transportation Needs: No Transportation Needs (10/13/2023)   Received from [facility] - Transportation    In the past 12 months, has lack of transportation kept you from medical appointments or from getting medications?: No    Lack of Transportation (Non-Medical): No  Physical Activity: Sufficiently Active (07/09/2023)   Exercise Vital Sign    Days of Exercise per Week: 6 days    Minutes of Exercise per Session: 40 min  Stress: No Stress Concern Present  (07/09/2023)   [employer] of Occupational [employer] - Occupational Stress Questionnaire    Feeling of Stress: Only a little  Social Connections: Socially Integrated (07/09/2023)   Social Connection and Isolation Panel    Frequency of Communication with Friends and Family: More than three times a week    Frequency of Social Gatherings with Friends and Family: More than three times a week    Attends Religious Services: More than 4 times per year    Active Member of [employer] or Organizations: Yes    Attends Engineer, Structural: More than 4 times per year    Marital Status: Married  Catering Manager Violence: Not At Risk (09/28/2022)   Humiliation, Afraid, Rape, and Kick questionnaire    Fear of Current or Ex-Partner: No    Emotionally Abused: No    Physically Abused: No    Sexually Abused: No    Review of Systems  Constitutional:  Negative for chills, diaphoresis, fever and weight loss.  Respiratory:  Negative for cough, shortness of breath and wheezing.   Cardiovascular:  Negative for chest pain, palpitations, orthopnea and leg swelling.  Gastrointestinal: Negative.   Neurological: Negative.   Endo/Heme/Allergies: Negative.   Psychiatric/Behavioral: Negative.         Objective    BP 128/66 (BP Location: Left Arm, Patient Position: Sitting, Cuff Size: Normal)   Pulse 81   Temp 98.4 F (36.9 C) (Oral)   Resp 15   Ht 5' 4.49 (1.638 m)   Wt 121 lb 3.2 oz (55 kg)   SpO2 99%  BMI 20.49 kg/m   Physical Exam Vitals and nursing note reviewed.  Constitutional:      General: She is awake. She is not in acute distress.    Appearance: She is well-developed and well-groomed. She is not ill-appearing or toxic-appearing.  HENT:     Head: Normocephalic.     Right Ear: Hearing and external ear normal.     Left Ear: Hearing and external ear normal.  Eyes:     General: Lids are normal.        Right eye: No discharge.        Left eye: No discharge.     Conjunctiva/sclera:  Conjunctivae normal.     Pupils: Pupils are equal, round, and reactive to light.  Neck:     Thyroid : No thyromegaly.     Vascular: No carotid bruit.  Cardiovascular:     Rate and Rhythm: Normal rate and regular rhythm.     Heart sounds: Normal heart sounds. No murmur heard.    No gallop.  Pulmonary:     Effort: Pulmonary effort is normal. No accessory muscle usage or respiratory distress.     Breath sounds: Normal breath sounds.  Abdominal:     General: Bowel sounds are normal. There is no distension.     Palpations: Abdomen is soft.     Tenderness: There is no abdominal tenderness.  Musculoskeletal:     Cervical back: Normal range of motion and neck supple.     Right lower leg: No edema.     Left lower leg: No edema.  Lymphadenopathy:     Cervical: No cervical adenopathy.  Skin:    General: Skin is warm and dry.  Neurological:     Mental Status: She is alert and oriented to person, place, and time.     Deep Tendon Reflexes: Reflexes are normal and symmetric.     Reflex Scores:      Brachioradialis reflexes are 2+ on the right side and 2+ on the left side.      Patellar reflexes are 2+ on the right side and 2+ on the left side. Psychiatric:        Attention and Perception: Attention normal.        Mood and Affect: Mood normal.        Speech: Speech normal.        Behavior: Behavior normal. Behavior is cooperative.        Thought Content: Thought content normal.    Last CBC Lab Results  Component Value Date   WBC 5.7 06/10/2022   HGB 14.2 06/10/2022   HCT 42.0 06/10/2022   MCV 91 06/10/2022   MCH 30.7 06/10/2022   RDW 12.3 06/10/2022   PLT 238 06/10/2022   Last metabolic panel Lab Results  Component Value Date   GLUCOSE 76 07/14/2023   NA 136 07/14/2023   K 5.0 07/14/2023   CL 97 07/14/2023   CO2 25 07/14/2023   BUN 15 07/14/2023   CREATININE 0.78 07/14/2023   EGFR 81 07/14/2023   CALCIUM 9.8 07/14/2023   PROT 6.9 07/14/2023   ALBUMIN 4.5 07/14/2023    LABGLOB 2.4 07/14/2023   AGRATIO 1.6 06/10/2022   BILITOT 0.3 07/14/2023   ALKPHOS 68 07/14/2023   AST 20 07/14/2023   ALT 15 07/14/2023   Last lipids Lab Results  Component Value Date   CHOL 188 07/14/2023   HDL 71 07/14/2023   LDLCALC 107 (H) 07/14/2023   TRIG 53 07/14/2023   CHOLHDL  2.6 07/14/2023   Last hemoglobin A1c Lab Results  Component Value Date   HGBA1C 5.2 11/30/2016   Last thyroid  functions Lab Results  Component Value Date   TSH 2.520 09/17/2020   Last vitamin D Lab Results  Component Value Date   VD25OH 76.5 09/17/2020      Assessment & Plan:   Problem List Items Addressed This Visit       Cardiovascular and Mediastinum   Essential hypertension - Primary   Chronic, ongoing. BP at goal on recheck today. Recommend she monitor BP at least a few mornings a week at home and document.  DASH diet at home.  Continue current medication regimen and adjust as needed.  Labs today: BMP, CBC, TSH.  Return in 6 months.       Relevant Orders   CBC with Differential/Platelet   Basic metabolic panel with GFR   TSH     Digestive   GERD (gastroesophageal reflux disease)   Chronic, ongoing. Has been on Protonix  40 MG daily for years, with her osteoporosis recommend she try reducing this. She is going to trial taking 1/2 tablet (20 MG) daily and see if benefit still present. Could consider further reduction in future. Risks of PPI use were discussed with patient including bone loss, C. Diff diarrhea, pneumonia, infections, CKD, electrolyte abnormalities.  Verbalizes understanding and chooses to continue the medication.         Musculoskeletal and Integument   Age-related osteoporosis without current pathological fracture   Chronic, ongoing. Followed by endo and receiving Reclast infusions annually.  Will continue this collaboration. Recent notes and labs reviewed. Recheck Vitamin D since recent level was above normal and she has adjusted supplements.       Relevant Medications   zoledronic acid (RECLAST) 5 MG/100ML SOLN injection   Other Relevant Orders   VITAMIN D 25 Hydroxy (Vit-D Deficiency, Fractures)     Other   Hyperlipidemia   Chronic, ongoing. LDL slightly elevated by ASCVD >7%.  We discussed today. She prefers not to take statin therapy. Discussed possibility of obtaining CT Calcium Scoring in future to further assess risk. For now continue focus on healthy diet and regular exercise + take Omega 3 daily. The 10-year ASCVD risk score (Arnett DK, et al., 2019) is: 13.3%   Values used to calculate the score:     Age: 24 years     Clincally relevant sex: Female     Is Non-Hispanic African American: No     Diabetic: No     Tobacco smoker: No     Systolic Blood Pressure: 128 mmHg     Is BP treated: Yes     HDL Cholesterol: 71 mg/dL     Total Cholesterol: 188 mg/dL       Other Visit Diagnoses       Encounter to establish care       New patient to clinic, introduced to setting and provider.       Return in about 7 months (around 07/17/2024) for Annual Physical -- after 07/15/24.   Mele Sylvester T Giuseppe Duchemin, NP

## 2023-12-22 NOTE — Assessment & Plan Note (Signed)
 Chronic, ongoing. Has been on Protonix  40 MG daily for years, with her osteoporosis recommend she try reducing this. She is going to trial taking 1/2 tablet (20 MG) daily and see if benefit still present. Could consider further reduction in future. Risks of PPI use were discussed with patient including bone loss, C. Diff diarrhea, pneumonia, infections, CKD, electrolyte abnormalities.  Verbalizes understanding and chooses to continue the medication.

## 2023-12-22 NOTE — Assessment & Plan Note (Addendum)
 Chronic, ongoing. LDL slightly elevated by ASCVD >7%.  We discussed today. She prefers not to take statin therapy. Discussed possibility of obtaining CT Calcium Scoring in future to further assess risk. For now continue focus on healthy diet and regular exercise + take Omega 3 daily. The 10-year ASCVD risk score (Arnett DK, et al., 2019) is: 13.3%   Values used to calculate the score:     Age: 73 years     Clincally relevant sex: Female     Is Non-Hispanic African American: No     Diabetic: No     Tobacco smoker: No     Systolic Blood Pressure: 128 mmHg     Is BP treated: Yes     HDL Cholesterol: 71 mg/dL     Total Cholesterol: 188 mg/dL

## 2023-12-22 NOTE — Assessment & Plan Note (Signed)
 Chronic, ongoing. BP at goal on recheck today. Recommend she monitor BP at least a few mornings a week at home and document.  DASH diet at home.  Continue current medication regimen and adjust as needed.  Labs today: BMP, CBC, TSH.  Return in 6 months.

## 2023-12-23 LAB — CBC WITH DIFFERENTIAL/PLATELET
Basophils Absolute: 0.1 x10E3/uL (ref 0.0–0.2)
Basos: 1 %
EOS (ABSOLUTE): 0.1 x10E3/uL (ref 0.0–0.4)
Eos: 2 %
Hematocrit: 43.2 % (ref 34.0–46.6)
Hemoglobin: 14.2 g/dL (ref 11.1–15.9)
Immature Grans (Abs): 0 x10E3/uL (ref 0.0–0.1)
Immature Granulocytes: 0 %
Lymphocytes Absolute: 1.6 x10E3/uL (ref 0.7–3.1)
Lymphs: 25 %
MCH: 29.9 pg (ref 26.6–33.0)
MCHC: 32.9 g/dL (ref 31.5–35.7)
MCV: 91 fL (ref 79–97)
Monocytes Absolute: 0.5 x10E3/uL (ref 0.1–0.9)
Monocytes: 7 %
Neutrophils Absolute: 4 x10E3/uL (ref 1.4–7.0)
Neutrophils: 65 %
Platelets: 291 x10E3/uL (ref 150–450)
RBC: 4.75 x10E6/uL (ref 3.77–5.28)
RDW: 12.4 % (ref 11.7–15.4)
WBC: 6.2 x10E3/uL (ref 3.4–10.8)

## 2023-12-23 LAB — BASIC METABOLIC PANEL WITH GFR
BUN/Creatinine Ratio: 20 (ref 12–28)
BUN: 13 mg/dL (ref 8–27)
CO2: 27 mmol/L (ref 20–29)
Calcium: 9.9 mg/dL (ref 8.7–10.3)
Chloride: 94 mmol/L — ABNORMAL LOW (ref 96–106)
Creatinine, Ser: 0.64 mg/dL (ref 0.57–1.00)
Glucose: 94 mg/dL (ref 70–99)
Potassium: 5.1 mmol/L (ref 3.5–5.2)
Sodium: 134 mmol/L (ref 134–144)
eGFR: 94 mL/min/1.73 (ref >59)

## 2023-12-23 LAB — TSH: TSH: 1.8 u[IU]/mL (ref 0.450–4.500)

## 2023-12-23 LAB — VITAMIN D 25 HYDROXY (VIT D DEFICIENCY, FRACTURES): Vit D, 25-Hydroxy: 73.6 ng/mL (ref 30.0–100.0)

## 2023-12-24 ENCOUNTER — Ambulatory Visit: Payer: Self-pay | Admitting: Nurse Practitioner

## 2023-12-24 NOTE — Progress Notes (Signed)
 Contacted via MyChart  Good day [name], your labs have returned: - Kidney function, creatinine and eGFR, remains normal. - Remainder of labs stable and Vitamin D  now normal range. Great news. Continue all current medications. Any questions? Keep being amazing!!  Thank you for allowing me to participate in your care.  I appreciate you. Kindest regards, Harace Mccluney

## 2023-12-27 DIAGNOSIS — M3501 Sicca syndrome with keratoconjunctivitis: Secondary | ICD-10-CM | POA: Diagnosis not present

## 2023-12-27 DIAGNOSIS — H21233 Degeneration of iris (pigmentary), bilateral: Secondary | ICD-10-CM | POA: Diagnosis not present

## 2023-12-27 DIAGNOSIS — H02889 Meibomian gland dysfunction of unspecified eye, unspecified eyelid: Secondary | ICD-10-CM | POA: Diagnosis not present

## 2023-12-27 DIAGNOSIS — H2512 Age-related nuclear cataract, left eye: Secondary | ICD-10-CM | POA: Diagnosis not present

## 2024-01-21 ENCOUNTER — Encounter: Payer: Self-pay | Admitting: Nurse Practitioner

## 2024-01-21 ENCOUNTER — Other Ambulatory Visit: Payer: Self-pay | Admitting: Nurse Practitioner

## 2024-01-21 DIAGNOSIS — Z1231 Encounter for screening mammogram for malignant neoplasm of breast: Secondary | ICD-10-CM

## 2024-02-07 ENCOUNTER — Encounter: Payer: Self-pay | Admitting: Nurse Practitioner

## 2024-02-21 ENCOUNTER — Encounter: Payer: Self-pay | Admitting: Nurse Practitioner

## 2024-02-21 DIAGNOSIS — K219 Gastro-esophageal reflux disease without esophagitis: Secondary | ICD-10-CM

## 2024-02-22 MED ORDER — PANTOPRAZOLE SODIUM 40 MG PO TBEC: 40.0000 mg | DELAYED_RELEASE_TABLET | Freq: Every day | ORAL | 11 refills | Status: AC

## 2024-03-06 ENCOUNTER — Encounter

## 2024-07-14 ENCOUNTER — Encounter: Admitting: Family Medicine

## 2024-07-24 ENCOUNTER — Encounter: Admitting: Nurse Practitioner
